# Patient Record
Sex: Female | Born: 1935 | Race: White | Hispanic: No | Marital: Married | Smoking: Never smoker
Health system: Southern US, Community
[De-identification: ages and names within clinical notes are randomized; demographics above are authoritative.]

## PROBLEM LIST (undated history)

## (undated) DIAGNOSIS — E7800 Pure hypercholesterolemia, unspecified: Secondary | ICD-10-CM

## (undated) DIAGNOSIS — F0390 Unspecified dementia without behavioral disturbance: Secondary | ICD-10-CM

## (undated) DIAGNOSIS — R69 Illness, unspecified: Secondary | ICD-10-CM

## (undated) DIAGNOSIS — I10 Essential (primary) hypertension: Secondary | ICD-10-CM

## (undated) HISTORY — PX: EYE SURGERY: SHX253

---

## 2002-01-27 ENCOUNTER — Other Ambulatory Visit: Admission: RE | Admit: 2002-01-27 | Discharge: 2002-01-27 | Payer: Self-pay | Admitting: Internal Medicine

## 2002-02-01 ENCOUNTER — Ambulatory Visit (HOSPITAL_COMMUNITY): Admission: RE | Admit: 2002-02-01 | Discharge: 2002-02-01 | Payer: Self-pay | Admitting: Internal Medicine

## 2002-02-01 ENCOUNTER — Encounter: Payer: Self-pay | Admitting: Internal Medicine

## 2004-08-30 ENCOUNTER — Ambulatory Visit (HOSPITAL_COMMUNITY): Admission: RE | Admit: 2004-08-30 | Discharge: 2004-08-30 | Payer: Self-pay | Admitting: Internal Medicine

## 2007-05-17 ENCOUNTER — Ambulatory Visit (HOSPITAL_COMMUNITY): Admission: RE | Admit: 2007-05-17 | Discharge: 2007-05-17 | Payer: Self-pay | Admitting: Ophthalmology

## 2009-12-13 ENCOUNTER — Ambulatory Visit (HOSPITAL_COMMUNITY): Admission: RE | Admit: 2009-12-13 | Discharge: 2009-12-13 | Payer: Self-pay | Admitting: Ophthalmology

## 2011-03-17 LAB — BASIC METABOLIC PANEL
BUN: 30 mg/dL — ABNORMAL HIGH (ref 6–23)
CO2: 28 mEq/L (ref 19–32)
Calcium: 9.8 mg/dL (ref 8.4–10.5)
Chloride: 99 mEq/L (ref 96–112)
Creatinine, Ser: 1 mg/dL (ref 0.4–1.2)
GFR calc Af Amer: >60 mL/min (ref >60)
GFR calc non Af Amer: 53 mL/min — ABNORMAL LOW (ref >60)
Glucose, Bld: 98 mg/dL (ref 70–99)
Potassium: 4.3 mEq/L (ref 3.5–5.1)
Sodium: 136 mEq/L (ref 135–145)

## 2011-03-17 LAB — GLUCOSE, CAPILLARY: Glucose-Capillary: 91 mg/dL (ref 70–99)

## 2011-03-17 LAB — HEMOGLOBIN AND HEMATOCRIT, BLOOD
HCT: 37.1 % (ref 36.0–46.0)
Hemoglobin: 12.5 g/dL (ref 12.0–15.0)

## 2011-07-20 ENCOUNTER — Encounter: Payer: Self-pay | Admitting: Emergency Medicine

## 2011-07-20 ENCOUNTER — Emergency Department (HOSPITAL_COMMUNITY)
Admission: EM | Admit: 2011-07-20 | Discharge: 2011-07-20 | Disposition: A | Discharge status: Home / Self Care | Payer: Medicare Other | Attending: Emergency Medicine | Admitting: Emergency Medicine

## 2011-07-20 ENCOUNTER — Emergency Department (HOSPITAL_COMMUNITY): Payer: Medicare Other

## 2011-07-20 DIAGNOSIS — W108XXA Fall (on) (from) other stairs and steps, initial encounter: Secondary | ICD-10-CM | POA: Insufficient documentation

## 2011-07-20 DIAGNOSIS — W101XXA Fall (on)(from) sidewalk curb, initial encounter: Secondary | ICD-10-CM

## 2011-07-20 DIAGNOSIS — R143 Flatulence: Secondary | ICD-10-CM | POA: Insufficient documentation

## 2011-07-20 DIAGNOSIS — E119 Type 2 diabetes mellitus without complications: Secondary | ICD-10-CM | POA: Insufficient documentation

## 2011-07-20 DIAGNOSIS — R141 Gas pain: Secondary | ICD-10-CM | POA: Insufficient documentation

## 2011-07-20 DIAGNOSIS — R142 Eructation: Secondary | ICD-10-CM | POA: Insufficient documentation

## 2011-07-20 DIAGNOSIS — S81009A Unspecified open wound, unspecified knee, initial encounter: Secondary | ICD-10-CM | POA: Insufficient documentation

## 2011-07-20 DIAGNOSIS — IMO0002 Reserved for concepts with insufficient information to code with codable children: Secondary | ICD-10-CM

## 2011-07-20 DIAGNOSIS — I10 Essential (primary) hypertension: Secondary | ICD-10-CM | POA: Insufficient documentation

## 2011-07-20 DIAGNOSIS — Y9229 Other specified public building as the place of occurrence of the external cause: Secondary | ICD-10-CM | POA: Insufficient documentation

## 2011-07-20 HISTORY — DX: Illness, unspecified: R69

## 2011-07-20 HISTORY — DX: Essential (primary) hypertension: I10

## 2011-07-20 MED ORDER — TETANUS-DIPHTH-ACELL PERTUSSIS 5-2.5-18.5 LF-MCG/0.5 IM SUSP
0.5000 mL | Freq: Once | INTRAMUSCULAR | Status: AC
  Administered 2011-07-20: 0.5 mL via INTRAMUSCULAR
  Filled 2011-07-20: qty 0.5

## 2011-07-20 MED ORDER — CEPHALEXIN 500 MG PO CAPS: 500.0000 mg | ORAL_CAPSULE | Freq: Four times a day (QID) | ORAL | Status: AC

## 2011-07-20 NOTE — ED Notes (Signed)
No change. Awaiting edp re eval. Nad.
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
## 2011-07-20 NOTE — ED Notes (Signed)
Put syringe in needle box prior to scanning.

## 2011-07-20 NOTE — ED Notes (Signed)
Area cleaned with shurclens and rinsed with NS. Nad. Awaiting xray.

## 2011-11-14 ENCOUNTER — Emergency Department (HOSPITAL_COMMUNITY)
Admission: EM | Admit: 2011-11-14 | Discharge: 2011-11-14 | Disposition: A | Discharge status: Home / Self Care | Payer: Medicare Other | Attending: Emergency Medicine | Admitting: Emergency Medicine

## 2011-11-14 ENCOUNTER — Encounter (HOSPITAL_COMMUNITY): Payer: Self-pay | Admitting: Emergency Medicine

## 2011-11-14 DIAGNOSIS — I10 Essential (primary) hypertension: Secondary | ICD-10-CM | POA: Insufficient documentation

## 2011-11-14 DIAGNOSIS — N8110 Cystocele, unspecified: Secondary | ICD-10-CM | POA: Insufficient documentation

## 2011-11-14 DIAGNOSIS — E119 Type 2 diabetes mellitus without complications: Secondary | ICD-10-CM | POA: Insufficient documentation

## 2011-11-14 LAB — URINALYSIS, ROUTINE W REFLEX MICROSCOPIC
Bilirubin Urine: NEGATIVE
Glucose, UA: NEGATIVE mg/dL
Ketones, ur: NEGATIVE mg/dL
Nitrite: NEGATIVE
Protein, ur: NEGATIVE mg/dL
Specific Gravity, Urine: 1.015 (ref 1.005–1.030)
Urobilinogen, UA: 0.2 mg/dL (ref 0.0–1.0)
pH: 6 (ref 5.0–8.0)

## 2011-11-14 LAB — URINE MICROSCOPIC-ADD ON

## 2011-11-14 NOTE — ED Notes (Signed)
Pt states she has been having "prolapsed bladder" ?uterus x 1 year. Pt states "something has been coming out for a year and I just push it back in there" denies pain. nad noted.

## 2011-11-14 NOTE — ED Provider Notes (Signed)
History    Scribed for Shelda Jakes, MD, the patient was seen in room APA03/APA03. This chart was scribed by Katha Cabal.   CSN: [id number] Arrival date & time: 11/14/2011 11:37 AM   First MD Initiated Contact with Patient 11/14/11 1239      Chief Complaint  Patient presents with  . Vaginal Prolapse    (Consider location/radiation/quality/duration/timing/severity/associated sxs/prior treatment) Patient is a 77 y.o. female presenting with female genitourinary complaint. The history is provided by the patient and the spouse. No language interpreter was used.  Female GU Problem Primary symptoms include no discharge, no pelvic pain and no dysuria. Primary symptoms comment: bladder prolapse  There has been no fever. This is a new problem. Episode onset: about a year.  The problem has not changed since onset.She is not pregnant. She has not missed her period. Pertinent negatives include no abdominal pain. Treatments tried: Patient states she pused skin fold back inside.   Patient reports that her "bladder drops down".  This is a new problem that has been intermittent for past year.  Husband states that Dr. Margo Aye is not aware of issue.  Patient has not seen a OB-GYN.     PCP Dwana Melena, MD     Past Medical History  Diagnosis Date  . Diabetes mellitus   . Hypertension   . Diagnosis unknown     History reviewed. No pertinent past surgical history.  No family history on file.  History  Substance Use Topics  . Smoking status: Never Smoker   . Smokeless tobacco: Not on file  . Alcohol Use: No    OB History    Grav Para Term Preterm Abortions TAB SAB Ect Mult Living                  Review of Systems  Cardiovascular: Negative for chest pain.  Gastrointestinal: Negative for abdominal pain.  Genitourinary: Negative for dysuria, vaginal discharge, vaginal pain and pelvic pain.  All other systems reviewed and are negative.    Allergies  Review of patient's allergies  indicates no known allergies.  Home Medications   Current Outpatient Rx  Name Route Sig Dispense Refill  . AMLODIPINE BESYLATE-VALSARTAN 5-320 MG PO TABS Oral Take 1 tablet by mouth daily.      Marland Kitchen CLORAZEPATE DIPOTASSIUM 7.5 MG PO TABS Oral Take 7.5 mg by mouth daily as needed. FOR ANXIETY     . HYDROCHLOROTHIAZIDE 12.5 MG PO CAPS Oral Take 12.5 mg by mouth every morning.      Marland Kitchen LEVOTHYROXINE SODIUM 25 MCG PO TABS Oral Take 25 mcg by mouth daily.      Marland Kitchen METFORMIN HCL 500 MG PO TABS Oral Take 500 mg by mouth 2 (two) times daily.      Marland Kitchen METOPROLOL SUCCINATE ER 50 MG PO TB24 Oral Take 50 mg by mouth daily.      Marland Kitchen SIMVASTATIN 40 MG PO TABS Oral Take 40 mg by mouth daily.        BP 160/68  Pulse 73  Temp 97.3 F (36.3 C)  Resp 18  Ht 5\' 2"  (1.575 m)  Wt 110 lb (49.896 kg)  BMI 20.12 kg/m2  SpO2 100%  Physical Exam  Nursing note and vitals reviewed. Constitutional: She is oriented to person, place, and time. She appears well-developed and well-nourished. No distress.  HENT:  Head: Normocephalic and atraumatic.  Eyes: Conjunctivae and EOM are normal.  Neck: Neck supple.  Cardiovascular: Normal rate, regular rhythm and normal heart sounds.  Pulmonary/Chest: Effort normal and breath sounds normal. No respiratory distress.  Abdominal: Soft. There is no tenderness. There is no rebound and no guarding.  Genitourinary: Vagina normal. No vaginal discharge found.  Musculoskeletal: Normal range of motion. She exhibits no edema.  Neurological: She is alert and oriented to person, place, and time. No sensory deficit.  Skin: Skin is warm and dry. No rash noted.  Psychiatric: She has a normal mood and affect. Her behavior is normal.    ED Course  Procedures (including critical care time)   DIAGNOSTIC STUDIES: Oxygen Saturation is 100% on room air, normal by my interpretation.     COORDINATION OF CARE: 1:41 PM  Physical exam complete.  No prolapse appreciated.  1:56 PM  Plan to  discharge patient with GYN referral.  Patient agrees with plan.    Orders Placed This Encounter  Procedures  . Urinalysis with microscopic  . Urine microscopic-add on     LABS / RADIOLOGY:   Labs Reviewed  URINALYSIS, ROUTINE W REFLEX MICROSCOPIC - Abnormal; Notable for the following:    Hgb urine dipstick TRACE (*)    Leukocytes, UA MODERATE (*)    All other components within normal limits  URINE MICROSCOPIC-ADD ON   No results found.       MDM   MDM:   Bimanual pelvic exam without any significant abnormal findings no direct evidence of bladder or uterine prolapse however by history patient sounds like she's got bladder prolapse. Will refer to OB/GYN locally for further evaluation.        IMPRESSION: 1. Vaginal wall prolapse        I personally performed the services described in this documentation, which was scribed in my presence. The recorded information has been reviewed and considered.              Shelda Jakes, MD 11/14/11 1409

## 2013-05-16 ENCOUNTER — Encounter: Payer: Self-pay | Admitting: *Deleted

## 2013-05-17 ENCOUNTER — Ambulatory Visit: Payer: Self-pay | Admitting: Obstetrics & Gynecology

## 2013-06-06 ENCOUNTER — Ambulatory Visit (INDEPENDENT_AMBULATORY_CARE_PROVIDER_SITE_OTHER): Payer: Medicare Other | Admitting: Obstetrics & Gynecology

## 2013-06-06 ENCOUNTER — Encounter: Payer: Self-pay | Admitting: Obstetrics & Gynecology

## 2013-06-06 VITALS — BP 140/60 | Wt 96.0 lb

## 2013-06-06 DIAGNOSIS — N813 Complete uterovaginal prolapse: Secondary | ICD-10-CM

## 2013-06-06 NOTE — Progress Notes (Signed)
Patient ID: Paula [name], female   DOB: 11-Aug-1923, 79 y.o.   MRN: [id number]      [name] presents today for routine follow up related to her pessary.   She uses a Gelhorn 2 1/4 inches. She reports no vaginal discharge or vaginal bleeding.  Exam reveals no undue vaginal mucosal pressure of breakdown, no discharge and no vaginal bleeding.  The pessary is removed, cleaned and replaced without difficulty.    Paula [name] will be sen back in 6 months for continued follow up.  Lazaro Arms 06/06/2013 12:34 PM

## 2013-09-18 ENCOUNTER — Emergency Department (HOSPITAL_COMMUNITY)
Admission: EM | Admit: 2013-09-18 | Discharge: 2013-09-18 | Discharge status: Against Medical Advice | Payer: Medicare Other

## 2016-02-11 DIAGNOSIS — G309 Alzheimer's disease, unspecified: Secondary | ICD-10-CM | POA: Diagnosis not present

## 2016-02-11 DIAGNOSIS — F411 Generalized anxiety disorder: Secondary | ICD-10-CM | POA: Diagnosis not present

## 2016-02-11 DIAGNOSIS — E119 Type 2 diabetes mellitus without complications: Secondary | ICD-10-CM | POA: Diagnosis not present

## 2016-02-11 DIAGNOSIS — F0280 Dementia in other diseases classified elsewhere without behavioral disturbance: Secondary | ICD-10-CM | POA: Diagnosis not present

## 2016-02-20 DIAGNOSIS — F419 Anxiety disorder, unspecified: Secondary | ICD-10-CM | POA: Diagnosis not present

## 2016-02-20 DIAGNOSIS — F0390 Unspecified dementia without behavioral disturbance: Secondary | ICD-10-CM | POA: Diagnosis not present

## 2016-02-20 DIAGNOSIS — G309 Alzheimer's disease, unspecified: Secondary | ICD-10-CM | POA: Diagnosis not present

## 2016-02-20 DIAGNOSIS — I10 Essential (primary) hypertension: Secondary | ICD-10-CM | POA: Diagnosis not present

## 2016-02-20 DIAGNOSIS — E038 Other specified hypothyroidism: Secondary | ICD-10-CM | POA: Diagnosis not present

## 2016-02-20 DIAGNOSIS — E785 Hyperlipidemia, unspecified: Secondary | ICD-10-CM | POA: Diagnosis not present

## 2016-02-21 DIAGNOSIS — G309 Alzheimer's disease, unspecified: Secondary | ICD-10-CM | POA: Diagnosis not present

## 2016-02-21 DIAGNOSIS — F419 Anxiety disorder, unspecified: Secondary | ICD-10-CM | POA: Diagnosis not present

## 2016-03-04 DIAGNOSIS — F0280 Dementia in other diseases classified elsewhere without behavioral disturbance: Secondary | ICD-10-CM | POA: Diagnosis not present

## 2016-03-04 DIAGNOSIS — E119 Type 2 diabetes mellitus without complications: Secondary | ICD-10-CM | POA: Diagnosis not present

## 2016-03-04 DIAGNOSIS — G309 Alzheimer's disease, unspecified: Secondary | ICD-10-CM | POA: Diagnosis not present

## 2016-03-04 DIAGNOSIS — F419 Anxiety disorder, unspecified: Secondary | ICD-10-CM | POA: Diagnosis not present

## 2016-03-04 DIAGNOSIS — E785 Hyperlipidemia, unspecified: Secondary | ICD-10-CM | POA: Diagnosis not present

## 2016-03-04 DIAGNOSIS — I10 Essential (primary) hypertension: Secondary | ICD-10-CM | POA: Diagnosis not present

## 2016-03-04 DIAGNOSIS — E039 Hypothyroidism, unspecified: Secondary | ICD-10-CM | POA: Diagnosis not present

## 2016-03-04 DIAGNOSIS — M6281 Muscle weakness (generalized): Secondary | ICD-10-CM | POA: Diagnosis not present

## 2016-03-04 DIAGNOSIS — R2681 Unsteadiness on feet: Secondary | ICD-10-CM | POA: Diagnosis not present

## 2016-03-12 DIAGNOSIS — G309 Alzheimer's disease, unspecified: Secondary | ICD-10-CM | POA: Diagnosis not present

## 2016-03-12 DIAGNOSIS — R2681 Unsteadiness on feet: Secondary | ICD-10-CM | POA: Diagnosis not present

## 2016-03-12 DIAGNOSIS — M6281 Muscle weakness (generalized): Secondary | ICD-10-CM | POA: Diagnosis not present

## 2016-03-12 DIAGNOSIS — F0280 Dementia in other diseases classified elsewhere without behavioral disturbance: Secondary | ICD-10-CM | POA: Diagnosis not present

## 2016-03-12 DIAGNOSIS — E785 Hyperlipidemia, unspecified: Secondary | ICD-10-CM | POA: Diagnosis not present

## 2016-03-12 DIAGNOSIS — E039 Hypothyroidism, unspecified: Secondary | ICD-10-CM | POA: Diagnosis not present

## 2016-03-12 DIAGNOSIS — E119 Type 2 diabetes mellitus without complications: Secondary | ICD-10-CM | POA: Diagnosis not present

## 2016-03-12 DIAGNOSIS — F419 Anxiety disorder, unspecified: Secondary | ICD-10-CM | POA: Diagnosis not present

## 2016-03-12 DIAGNOSIS — I10 Essential (primary) hypertension: Secondary | ICD-10-CM | POA: Diagnosis not present

## 2016-03-17 DIAGNOSIS — E785 Hyperlipidemia, unspecified: Secondary | ICD-10-CM | POA: Diagnosis not present

## 2016-03-17 DIAGNOSIS — R2681 Unsteadiness on feet: Secondary | ICD-10-CM | POA: Diagnosis not present

## 2016-03-17 DIAGNOSIS — G309 Alzheimer's disease, unspecified: Secondary | ICD-10-CM | POA: Diagnosis not present

## 2016-03-17 DIAGNOSIS — M6281 Muscle weakness (generalized): Secondary | ICD-10-CM | POA: Diagnosis not present

## 2016-03-17 DIAGNOSIS — E119 Type 2 diabetes mellitus without complications: Secondary | ICD-10-CM | POA: Diagnosis not present

## 2016-03-17 DIAGNOSIS — I10 Essential (primary) hypertension: Secondary | ICD-10-CM | POA: Diagnosis not present

## 2016-03-17 DIAGNOSIS — F419 Anxiety disorder, unspecified: Secondary | ICD-10-CM | POA: Diagnosis not present

## 2016-03-17 DIAGNOSIS — F0280 Dementia in other diseases classified elsewhere without behavioral disturbance: Secondary | ICD-10-CM | POA: Diagnosis not present

## 2016-03-17 DIAGNOSIS — E039 Hypothyroidism, unspecified: Secondary | ICD-10-CM | POA: Diagnosis not present

## 2016-03-19 ENCOUNTER — Inpatient Hospital Stay (HOSPITAL_COMMUNITY)
Admission: EM | Admit: 2016-03-19 | Discharge: 2016-03-24 | DRG: 481 | Disposition: A | Discharge status: Skilled Nursing Facility | Payer: PPO | Attending: Internal Medicine | Admitting: Internal Medicine

## 2016-03-19 ENCOUNTER — Emergency Department (HOSPITAL_COMMUNITY): Payer: PPO

## 2016-03-19 ENCOUNTER — Encounter (HOSPITAL_COMMUNITY): Payer: Self-pay

## 2016-03-19 DIAGNOSIS — S72091A Other fracture of head and neck of right femur, initial encounter for closed fracture: Secondary | ICD-10-CM | POA: Diagnosis not present

## 2016-03-19 DIAGNOSIS — R1310 Dysphagia, unspecified: Secondary | ICD-10-CM | POA: Diagnosis not present

## 2016-03-19 DIAGNOSIS — N390 Urinary tract infection, site not specified: Secondary | ICD-10-CM | POA: Diagnosis not present

## 2016-03-19 DIAGNOSIS — N183 Chronic kidney disease, stage 3 unspecified: Secondary | ICD-10-CM | POA: Diagnosis present

## 2016-03-19 DIAGNOSIS — I129 Hypertensive chronic kidney disease with stage 1 through stage 4 chronic kidney disease, or unspecified chronic kidney disease: Secondary | ICD-10-CM | POA: Diagnosis not present

## 2016-03-19 DIAGNOSIS — G309 Alzheimer's disease, unspecified: Secondary | ICD-10-CM | POA: Diagnosis not present

## 2016-03-19 DIAGNOSIS — I10 Essential (primary) hypertension: Secondary | ICD-10-CM | POA: Diagnosis not present

## 2016-03-19 DIAGNOSIS — S72001A Fracture of unspecified part of neck of right femur, initial encounter for closed fracture: Secondary | ICD-10-CM | POA: Diagnosis not present

## 2016-03-19 DIAGNOSIS — E785 Hyperlipidemia, unspecified: Secondary | ICD-10-CM | POA: Diagnosis present

## 2016-03-19 DIAGNOSIS — E1122 Type 2 diabetes mellitus with diabetic chronic kidney disease: Secondary | ICD-10-CM | POA: Diagnosis not present

## 2016-03-19 DIAGNOSIS — Z8249 Family history of ischemic heart disease and other diseases of the circulatory system: Secondary | ICD-10-CM

## 2016-03-19 DIAGNOSIS — Y92128 Other place in nursing home as the place of occurrence of the external cause: Secondary | ICD-10-CM | POA: Diagnosis not present

## 2016-03-19 DIAGNOSIS — S79921A Unspecified injury of right thigh, initial encounter: Secondary | ICD-10-CM | POA: Diagnosis not present

## 2016-03-19 DIAGNOSIS — E039 Hypothyroidism, unspecified: Secondary | ICD-10-CM | POA: Diagnosis not present

## 2016-03-19 DIAGNOSIS — S199XXA Unspecified injury of neck, initial encounter: Secondary | ICD-10-CM | POA: Diagnosis not present

## 2016-03-19 DIAGNOSIS — Z66 Do not resuscitate: Secondary | ICD-10-CM | POA: Diagnosis present

## 2016-03-19 DIAGNOSIS — M25551 Pain in right hip: Secondary | ICD-10-CM | POA: Diagnosis not present

## 2016-03-19 DIAGNOSIS — W19XXXA Unspecified fall, initial encounter: Secondary | ICD-10-CM | POA: Diagnosis not present

## 2016-03-19 DIAGNOSIS — R4182 Altered mental status, unspecified: Secondary | ICD-10-CM | POA: Diagnosis not present

## 2016-03-19 DIAGNOSIS — S72009A Fracture of unspecified part of neck of unspecified femur, initial encounter for closed fracture: Secondary | ICD-10-CM | POA: Diagnosis present

## 2016-03-19 DIAGNOSIS — Y92129 Unspecified place in nursing home as the place of occurrence of the external cause: Secondary | ICD-10-CM

## 2016-03-19 DIAGNOSIS — E7800 Pure hypercholesterolemia, unspecified: Secondary | ICD-10-CM | POA: Diagnosis not present

## 2016-03-19 DIAGNOSIS — I6789 Other cerebrovascular disease: Secondary | ICD-10-CM | POA: Diagnosis not present

## 2016-03-19 DIAGNOSIS — S72011D Unspecified intracapsular fracture of right femur, subsequent encounter for closed fracture with routine healing: Secondary | ICD-10-CM | POA: Diagnosis not present

## 2016-03-19 DIAGNOSIS — E038 Other specified hypothyroidism: Secondary | ICD-10-CM | POA: Diagnosis not present

## 2016-03-19 DIAGNOSIS — S3993XA Unspecified injury of pelvis, initial encounter: Secondary | ICD-10-CM | POA: Diagnosis not present

## 2016-03-19 DIAGNOSIS — F0390 Unspecified dementia without behavioral disturbance: Secondary | ICD-10-CM | POA: Diagnosis present

## 2016-03-19 DIAGNOSIS — S299XXA Unspecified injury of thorax, initial encounter: Secondary | ICD-10-CM | POA: Diagnosis not present

## 2016-03-19 DIAGNOSIS — E119 Type 2 diabetes mellitus without complications: Secondary | ICD-10-CM

## 2016-03-19 DIAGNOSIS — S72011A Unspecified intracapsular fracture of right femur, initial encounter for closed fracture: Principal | ICD-10-CM | POA: Diagnosis present

## 2016-03-19 DIAGNOSIS — S72019A Unspecified intracapsular fracture of unspecified femur, initial encounter for closed fracture: Secondary | ICD-10-CM | POA: Diagnosis present

## 2016-03-19 DIAGNOSIS — F4489 Other dissociative and conversion disorders: Secondary | ICD-10-CM | POA: Diagnosis not present

## 2016-03-19 DIAGNOSIS — T148XXA Other injury of unspecified body region, initial encounter: Secondary | ICD-10-CM

## 2016-03-19 HISTORY — DX: Pure hypercholesterolemia, unspecified: E78.00

## 2016-03-19 NOTE — ED Provider Notes (Signed)
CSN: [id number]     Arrival date & time 03/19/16  2231 History  By signing my name below, I, Paula [name], attest that this documentation has been prepared under the direction and in the presence of Paula Porter, MD at 2308. Electronically Signed: Doran [name], ED Scribe. 03/20/2016. 11:16 PM.   Chief Complaint  Patient presents with  . Fall   The history is provided by a relative. No language interpreter was used.    Level 5 Caveat for Dementia  HPI Comments: Paula [name] is a 82 y.o. female who presents to the Emergency Department with a PMHx of dementia, DM and HTN for an evaluation s/p fall. Granddaughter reports pt has been staying at [facility] with her husband for the past 4 weeks. Tonight she went to look in the hall to see if it was daytime or night time and when she turned to go back into her room she fell. She denied feeling dizzy to her granddaughter.  Since then, pt reports right leg pain, pointing to her knee. Granddaughter reports the pt is otherwise normal. Pt denies any head pain, CP, SOB, N/V/D or any other symptoms at this time.   Granddaughter states the pt was  on Tranxene as needed however at home and maybe took it 3 times a month, the nursing home has the pt taking it daily. The GD noted some shaking of her arms tonight.   PCP Dr Paula [name]  Past Medical History  Diagnosis Date  . Diabetes mellitus   . Hypertension   . Diagnosis unknown    Past Surgical History  Procedure Laterality Date  . Eye surgery     No family history on file. Social History  Substance Use Topics  . Smoking status: Never Smoker   . Smokeless tobacco: Never Used  . Alcohol Use: No   Lives with spouse Lives in nursing facility  OB History    No data available     Review of Systems  Respiratory: Negative for shortness of breath.   Cardiovascular: Negative for chest pain.  Gastrointestinal: Negative for nausea, vomiting and diarrhea.  Neurological: Negative for headaches.   All other systems reviewed and are negative.   Allergies  Review of patient's allergies indicates no known allergies.  Home Medications   Prior to Admission medications   Medication Sig Start Date End Date Taking? Authorizing Provider  amLODipine-valsartan (EXFORGE) 5-320 MG per tablet Take 1 tablet by mouth daily.      Historical Provider, MD  clorazepate (TRANXENE) 7.5 MG tablet Take 7.5 mg by mouth daily as needed. FOR ANXIETY     Historical Provider, MD  hydrochlorothiazide (,MICROZIDE/HYDRODIURIL,) 12.5 MG capsule Take 12.5 mg by mouth every morning.      Historical Provider, MD  levothyroxine (SYNTHROID, LEVOTHROID) 25 MCG tablet Take 25 mcg by mouth daily.      Historical Provider, MD  metFORMIN (GLUCOPHAGE) 500 MG tablet Take 500 mg by mouth 2 (two) times daily.      Historical Provider, MD  metoprolol (TOPROL-XL) 50 MG 24 hr tablet Take 50 mg by mouth daily.      Historical Provider, MD  simvastatin (ZOCOR) 40 MG tablet Take 40 mg by mouth daily.      Historical Provider, MD   BP 157/83 mmHg  Pulse 82  Temp(Src) 97.6 F (36.4 C) (Oral)  Resp 14  SpO2 98%  Vital signs normal     Physical Exam  Constitutional: She is oriented to person, place, and  time. She appears well-developed and well-nourished.  Non-toxic appearance. She does not appear ill. No distress.  HENT:  Head: Normocephalic and atraumatic.  Right Ear: External ear normal.  Left Ear: External ear normal.  Nose: Nose normal. No mucosal edema or rhinorrhea.  Mouth/Throat: Oropharynx is clear and moist and mucous membranes are normal. No dental abscesses or uvula swelling.  inconsistence complaints of head and neck pain.   Eyes: Conjunctivae and EOM are normal. Pupils are equal, round, and reactive to light.  Neck: Normal range of motion and full passive range of motion without pain. Neck supple.  Cardiovascular: Normal rate, regular rhythm and normal heart sounds.  Exam reveals no gallop and no friction rub.    No murmur heard. Pulmonary/Chest: Effort normal and breath sounds normal. No respiratory distress. She has no wheezes. She has no rhonchi. She has no rales. She exhibits no tenderness and no crepitus.  Abdominal: Soft. Normal appearance and bowel sounds are normal. She exhibits no distension. There is no tenderness. There is no rebound and no guarding.  Musculoskeletal: Normal range of motion. She exhibits no edema or tenderness.  Moves all extremities well; points to her right knee as to what is painful; no swelling, abrasions, or bruising noted.  Neurological: She is alert and oriented to person, place, and time. She has normal strength. No cranial nerve deficit.  Mild intentional tremor  Skin: Skin is warm, dry and intact. No rash noted. No erythema. No pallor.  Psychiatric: She has a normal mood and affect. Her speech is normal and behavior is normal. Her mood appears not anxious.  Nursing note and vitals reviewed.   ED Course  Procedures   Medications  cefTRIAXone (ROCEPHIN) 1 g in dextrose 5 % 50 mL IVPB (not administered)  fentaNYL (SUBLIMAZE) injection 25 mcg (25 mcg Intravenous Given 03/20/16 0050)  fentaNYL (SUBLIMAZE) injection 50 mcg (50 mcg Intravenous Given 03/20/16 0220)     DIAGNOSTIC STUDIES: Oxygen Saturation is 98% on room air, normal by my interpretation.    COORDINATION OF CARE: 11:08 PM Will order CT C-spine, CXR, right hip and femur x-ray. Discussed treatment plan with pt and granddaughter at bedside and they agreed to plan.  12:15 AM the x-ray is not clear per the radiologist's if there is a fracture present. CT of the hip was ordered. This was discussed with the granddaughter. We also started a discussion as to have them discuss where they would like her to be admitted for her fracture care. Patient is now starting to consistently complain of pain. She was given fentanyl  2:16 AM nurse reports patient is still complaining of a lot of pain and seems to be getting  confused. She was given a higher dose of fentanyl for her pain.  2:30 AM granddaughter states she talked to her brother who has used Dr. [name] in the past. They would like him to take care of their grandmother.  02:46 Dr [name] contacted, he will see in the morning.  03:05 Dr Loleta Books will see patient.   3:15 AM patient was given Rocephin for her urinary tract infection  Labs Review Results for orders placed or performed during the hospital encounter of 03/19/16  Comprehensive metabolic panel  Result Value Ref Range   Sodium 139 135 - 145 mmol/L   Potassium 4.3 3.5 - 5.1 mmol/L   Chloride 104 101 - 111 mmol/L   CO2 26 22 - 32 mmol/L   Glucose, Bld 139 (H) 65 - 99 mg/dL  BUN 37 (H) 6 - 20 mg/dL   Creatinine, Ser 1.10 (H) 0.44 - 1.00 mg/dL   Calcium 8.8 (L) 8.9 - 10.3 mg/dL   Total Protein 6.5 6.5 - 8.1 g/dL   Albumin 3.7 3.5 - 5.0 g/dL   AST 45 (H) 15 - 41 U/L   ALT 22 14 - 54 U/L   Alkaline Phosphatase 76 38 - 126 U/L   Total Bilirubin 1.0 0.3 - 1.2 mg/dL   GFR calc non Af Amer 43 (L) >60 mL/min   GFR calc Af Amer 49 (L) >60 mL/min   Anion gap 9 5 - 15  CBC with Differential  Result Value Ref Range   WBC 15.8 (H) 4.0 - 10.5 K/uL   RBC 4.14 3.87 - 5.11 MIL/uL   Hemoglobin 12.7 12.0 - 15.0 g/dL   HCT 38.0 36.0 - 46.0 %   MCV 91.8 78.0 - 100.0 fL   MCH 30.7 26.0 - 34.0 pg   MCHC 33.4 30.0 - 36.0 g/dL   RDW 13.4 11.5 - 15.5 %   Platelets 365 150 - 400 K/uL   Neutrophils Relative % 85 %   Neutro Abs 13.3 (H) 1.7 - 7.7 K/uL   Lymphocytes Relative 8 %   Lymphs Abs 1.3 0.7 - 4.0 K/uL   Monocytes Relative 7 %   Monocytes Absolute 1.2 (H) 0.1 - 1.0 K/uL   Eosinophils Relative 0 %   Eosinophils Absolute 0.0 0.0 - 0.7 K/uL   Basophils Relative 0 %   Basophils Absolute 0.0 0.0 - 0.1 K/uL   WBC Morphology INCREASED BANDS (>20% BANDS)   Protime-INR  Result Value Ref Range   Prothrombin Time 14.0 11.6 - 15.2 seconds   INR 1.06 0.00 - 1.49  APTT  Result Value Ref Range    aPTT 29 24 - 37 seconds  Urinalysis, Routine w reflex microscopic (not at [facility])  Result Value Ref Range   Color, Urine YELLOW YELLOW   APPearance CLEAR CLEAR   Specific Gravity, Urine 1.020 1.005 - 1.030   pH 6.0 5.0 - 8.0   Glucose, UA NEGATIVE NEGATIVE mg/dL   Hgb urine dipstick SMALL (A) NEGATIVE   Bilirubin Urine NEGATIVE NEGATIVE   Ketones, ur 15 (A) NEGATIVE mg/dL   Protein, ur TRACE (A) NEGATIVE mg/dL   Nitrite NEGATIVE NEGATIVE   Leukocytes, UA SMALL (A) NEGATIVE  Urine microscopic-add on  Result Value Ref Range   Squamous Epithelial / LPF 6-30 (A) NONE SEEN   WBC, UA TOO NUMEROUS TO COUNT 0 - 5 WBC/hpf   RBC / HPF 0-5 0 - 5 RBC/hpf   Bacteria, UA MANY (A) NONE SEEN  Sample to Blood Bank  Result Value Ref Range   Blood Bank Specimen SAMPLE AVAILABLE FOR TESTING    Sample Expiration 03/23/2016    Laboratory interpretation all normal except UTI, leukocytosis, mild renal insufficiency     Imaging Review Dg Chest 1 View  03/20/2016  CLINICAL DATA:  [facility] tonight. EXAM: CHEST 1 VIEW COMPARISON:  None. FINDINGS: A single AP view of the chest demonstrates no focal airspace consolidation or alveolar edema. The lungs are grossly clear. There is no large effusion or pneumothorax. Cardiac and mediastinal contours appear unremarkable. IMPRESSION: No active disease. Electronically Signed   By: Andreas Newport M.D.   On: 03/20/2016 00:08   Dg Pelvis 1-2 Views  03/20/2016  CLINICAL DATA:  [facility] tonight. EXAM: RIGHT FEMUR 2 VIEWS; PELVIS - 1-2 VIEW COMPARISON:  None. FINDINGS: A single AP view  the pelvis demonstrates probable subcapital right hip fracture. No dislocation. No bone lesion or bony destruction to suggest pathologic basis the fracture. Pubic symphysis and sacroiliac joints appear intact. Remainder of the femur is intact. IMPRESSION: Probable subcapital right hip fracture. No dislocation Electronically Signed   By: Andreas Newport M.D.   On: 03/20/2016 00:10   Ct Head Wo  Contrast  Ct Cervical Spine Wo Contrast  03/20/2016  CLINICAL DATA:  Fall with altered level of consciousness. EXAM: CT HEAD WITHOUT CONTRAST CT CERVICAL SPINE WITHOUT CONTRAST TECHNIQUE: Multidetector CT imaging of the head and cervical spine was performed following the standard protocol without intravenous contrast. Multiplanar CT image reconstructions of the cervical spine were also generated. COMPARISON:  None. FINDINGS: CT HEAD FINDINGS Diffuse atrophy present as well as moderate small vessel ischemic changes in the periventricular white matter. The brain demonstrates no evidence of hemorrhage, infarction, edema, mass effect, extra-axial fluid collection, hydrocephalus or mass lesion. The skull is unremarkable. CT CERVICAL SPINE FINDINGS The cervical spine shows normal alignment. There is no evidence of acute fracture or subluxation. No soft tissue swelling or hematoma is identified. Spondylosis present at C4-5, C5-6 and C6-7. No bony lesions identified. The visualized airway is normally patent. In the visualized left lobe of the thyroid gland, focal nodularity is seen measuring approximately 1.3 cm. IMPRESSION: 1. Small vessel disease and atrophy of the brain. No acute findings by head CT. 2. No acute cervical injury identified. Cervical spondylosis present at C4-5, C5-6 and C6-7. 3. Incidental detection of left thyroid nodule measuring approximately 1.3 cm. At the patient's age, this is likely not of significance. Elective thyroid ultrasound could be considered. Electronically Signed   By: Aletta Edouard M.D.   On: 03/20/2016 00:14   Ct Hip Right Wo Contrast  03/20/2016  CLINICAL DATA:  [facility] at nursing home tonight. EXAM: CT OF THE RIGHT HIP WITHOUT CONTRAST TECHNIQUE: Multidetector CT imaging of the right hip was performed according to the standard protocol. Multiplanar CT image reconstructions were also generated. COMPARISON:  Radiographs 03/19/2016 FINDINGS: There is a subcapital right hip fracture  with slight impaction. There is no dislocation. No bone lesion or bony destruction. No significant hematoma or other acute soft tissue abnormality. IMPRESSION: Subcapital right hip fracture Electronically Signed   By: Andreas Newport M.D.   On: 03/20/2016 01:34   Dg Femur, Min 2 Views Right  03/20/2016  CLINICAL DATA:  [facility] tonight. EXAM: RIGHT FEMUR 2 VIEWS; PELVIS - 1-2 VIEW COMPARISON:  None. FINDINGS: A single AP view the pelvis demonstrates probable subcapital right hip fracture. No dislocation. No bone lesion or bony destruction to suggest pathologic basis the fracture. Pubic symphysis and sacroiliac joints appear intact. Remainder of the femur is intact. IMPRESSION: Probable subcapital right hip fracture. No dislocation Electronically Signed   By: Andreas Newport M.D.   On: 03/20/2016 00:10   I have personally reviewed and evaluated these images and lab results as part of my medical decision-making.  ED ECG REPORT   Date: 03/20/2016  Rate: 87  Rhythm: normal sinus rhythm  QRS Axis: normal  Intervals: normal  ST/T Wave abnormalities: normal  Conduction Disutrbances:LVH  Narrative Interpretation:   Old EKG Reviewed: none available  I have personally reviewed the EKG tracing and agree with the computerized printout as noted.   MDM   Final diagnoses:  Fall at nursing home, initial encounter  Hip fracture, right, closed, initial encounter [facility])  UTI (lower urinary tract infection)    Plan admission  Paula Porter, MD, FACEP   I personally performed the services described in this documentation, which was scribed in my presence. The recorded information has been reviewed and considered.  Paula Porter, MD, Barbette Or, MD 03/20/16 [phone]

## 2016-03-19 NOTE — ED Notes (Signed)
Family member reported to staff at [facility] that patient had fallen.  Per ems, pt only c/o pain to right thigh.  No obvious injury noted.

## 2016-03-20 ENCOUNTER — Inpatient Hospital Stay (HOSPITAL_COMMUNITY): Payer: PPO

## 2016-03-20 ENCOUNTER — Emergency Department (HOSPITAL_COMMUNITY): Payer: PPO

## 2016-03-20 ENCOUNTER — Encounter (HOSPITAL_COMMUNITY)
Admission: EM | Disposition: A | Discharge status: Skilled Nursing Facility | Payer: Self-pay | Source: Home / Self Care | Attending: Internal Medicine

## 2016-03-20 ENCOUNTER — Encounter (HOSPITAL_COMMUNITY): Payer: Self-pay | Admitting: Family Medicine

## 2016-03-20 ENCOUNTER — Inpatient Hospital Stay (HOSPITAL_COMMUNITY): Payer: PPO | Admitting: Anesthesiology

## 2016-03-20 DIAGNOSIS — N390 Urinary tract infection, site not specified: Secondary | ICD-10-CM | POA: Diagnosis present

## 2016-03-20 DIAGNOSIS — S72019A Unspecified intracapsular fracture of unspecified femur, initial encounter for closed fracture: Secondary | ICD-10-CM | POA: Diagnosis present

## 2016-03-20 DIAGNOSIS — S72091A Other fracture of head and neck of right femur, initial encounter for closed fracture: Secondary | ICD-10-CM | POA: Diagnosis not present

## 2016-03-20 DIAGNOSIS — I129 Hypertensive chronic kidney disease with stage 1 through stage 4 chronic kidney disease, or unspecified chronic kidney disease: Secondary | ICD-10-CM | POA: Diagnosis not present

## 2016-03-20 DIAGNOSIS — N183 Chronic kidney disease, stage 3 unspecified: Secondary | ICD-10-CM | POA: Diagnosis present

## 2016-03-20 DIAGNOSIS — E785 Hyperlipidemia, unspecified: Secondary | ICD-10-CM | POA: Diagnosis not present

## 2016-03-20 DIAGNOSIS — M25551 Pain in right hip: Secondary | ICD-10-CM | POA: Diagnosis not present

## 2016-03-20 DIAGNOSIS — Z66 Do not resuscitate: Secondary | ICD-10-CM | POA: Diagnosis not present

## 2016-03-20 DIAGNOSIS — E1122 Type 2 diabetes mellitus with diabetic chronic kidney disease: Secondary | ICD-10-CM | POA: Diagnosis not present

## 2016-03-20 DIAGNOSIS — S79921A Unspecified injury of right thigh, initial encounter: Secondary | ICD-10-CM | POA: Diagnosis not present

## 2016-03-20 DIAGNOSIS — E038 Other specified hypothyroidism: Secondary | ICD-10-CM | POA: Diagnosis not present

## 2016-03-20 DIAGNOSIS — S199XXA Unspecified injury of neck, initial encounter: Secondary | ICD-10-CM | POA: Diagnosis not present

## 2016-03-20 DIAGNOSIS — F4489 Other dissociative and conversion disorders: Secondary | ICD-10-CM | POA: Diagnosis not present

## 2016-03-20 DIAGNOSIS — E039 Hypothyroidism, unspecified: Secondary | ICD-10-CM | POA: Diagnosis not present

## 2016-03-20 DIAGNOSIS — R4182 Altered mental status, unspecified: Secondary | ICD-10-CM | POA: Diagnosis not present

## 2016-03-20 DIAGNOSIS — S72009A Fracture of unspecified part of neck of unspecified femur, initial encounter for closed fracture: Secondary | ICD-10-CM | POA: Diagnosis present

## 2016-03-20 DIAGNOSIS — I10 Essential (primary) hypertension: Secondary | ICD-10-CM | POA: Diagnosis not present

## 2016-03-20 DIAGNOSIS — W19XXXA Unspecified fall, initial encounter: Secondary | ICD-10-CM | POA: Diagnosis not present

## 2016-03-20 DIAGNOSIS — S72001A Fracture of unspecified part of neck of right femur, initial encounter for closed fracture: Secondary | ICD-10-CM | POA: Diagnosis not present

## 2016-03-20 DIAGNOSIS — S299XXA Unspecified injury of thorax, initial encounter: Secondary | ICD-10-CM | POA: Diagnosis not present

## 2016-03-20 DIAGNOSIS — E119 Type 2 diabetes mellitus without complications: Secondary | ICD-10-CM | POA: Diagnosis not present

## 2016-03-20 DIAGNOSIS — I6789 Other cerebrovascular disease: Secondary | ICD-10-CM | POA: Diagnosis not present

## 2016-03-20 DIAGNOSIS — S3993XA Unspecified injury of pelvis, initial encounter: Secondary | ICD-10-CM | POA: Diagnosis not present

## 2016-03-20 DIAGNOSIS — R1310 Dysphagia, unspecified: Secondary | ICD-10-CM | POA: Diagnosis not present

## 2016-03-20 DIAGNOSIS — F0390 Unspecified dementia without behavioral disturbance: Secondary | ICD-10-CM | POA: Diagnosis not present

## 2016-03-20 DIAGNOSIS — G309 Alzheimer's disease, unspecified: Secondary | ICD-10-CM | POA: Diagnosis not present

## 2016-03-20 DIAGNOSIS — S72011D Unspecified intracapsular fracture of right femur, subsequent encounter for closed fracture with routine healing: Secondary | ICD-10-CM | POA: Diagnosis not present

## 2016-03-20 DIAGNOSIS — Y92128 Other place in nursing home as the place of occurrence of the external cause: Secondary | ICD-10-CM | POA: Diagnosis not present

## 2016-03-20 DIAGNOSIS — S72011A Unspecified intracapsular fracture of right femur, initial encounter for closed fracture: Secondary | ICD-10-CM | POA: Diagnosis not present

## 2016-03-20 DIAGNOSIS — E7800 Pure hypercholesterolemia, unspecified: Secondary | ICD-10-CM | POA: Diagnosis not present

## 2016-03-20 DIAGNOSIS — Z8249 Family history of ischemic heart disease and other diseases of the circulatory system: Secondary | ICD-10-CM | POA: Diagnosis not present

## 2016-03-20 HISTORY — PX: HIP PINNING,CANNULATED: SHX1758

## 2016-03-20 LAB — PROTIME-INR
INR: 1.06 (ref 0.00–1.49)
PROTHROMBIN TIME: 14 s (ref 11.6–15.2)

## 2016-03-20 LAB — CBC
HCT: 38.7 % (ref 36.0–46.0)
Hemoglobin: 12.8 g/dL (ref 12.0–15.0)
MCH: 30.5 pg (ref 26.0–34.0)
MCHC: 33.1 g/dL (ref 30.0–36.0)
MCV: 92.1 fL (ref 78.0–100.0)
PLATELETS: 350 10*3/uL (ref 150–400)
RBC: 4.2 MIL/uL (ref 3.87–5.11)
RDW: 13.3 % (ref 11.5–15.5)
WBC: 15.2 10*3/uL — ABNORMAL HIGH (ref 4.0–10.5)

## 2016-03-20 LAB — COMPREHENSIVE METABOLIC PANEL
ALBUMIN: 3.7 g/dL (ref 3.5–5.0)
ALK PHOS: 76 U/L (ref 38–126)
ALT: 22 U/L (ref 14–54)
ANION GAP: 9 (ref 5–15)
AST: 45 U/L — ABNORMAL HIGH (ref 15–41)
BILIRUBIN TOTAL: 1 mg/dL (ref 0.3–1.2)
BUN: 37 mg/dL — ABNORMAL HIGH (ref 6–20)
CALCIUM: 8.8 mg/dL — AB (ref 8.9–10.3)
CO2: 26 mmol/L (ref 22–32)
Chloride: 104 mmol/L (ref 101–111)
Creatinine, Ser: 1.1 mg/dL — ABNORMAL HIGH (ref 0.44–1.00)
GFR, EST AFRICAN AMERICAN: 49 mL/min — AB (ref >60)
GFR, EST NON AFRICAN AMERICAN: 43 mL/min — AB (ref >60)
Glucose, Bld: 139 mg/dL — ABNORMAL HIGH (ref 65–99)
POTASSIUM: 4.3 mmol/L (ref 3.5–5.1)
Sodium: 139 mmol/L (ref 135–145)
TOTAL PROTEIN: 6.5 g/dL (ref 6.5–8.1)

## 2016-03-20 LAB — URINALYSIS, ROUTINE W REFLEX MICROSCOPIC
Bilirubin Urine: NEGATIVE
GLUCOSE, UA: NEGATIVE mg/dL
Ketones, ur: 15 mg/dL — AB
Nitrite: NEGATIVE
PH: 6 (ref 5.0–8.0)
Specific Gravity, Urine: 1.02 (ref 1.005–1.030)

## 2016-03-20 LAB — SURGICAL PCR SCREEN
MRSA, PCR: NEGATIVE
Staphylococcus aureus: NEGATIVE

## 2016-03-20 LAB — BASIC METABOLIC PANEL
Anion gap: 12 (ref 5–15)
BUN: 34 mg/dL — AB (ref 6–20)
CO2: 22 mmol/L (ref 22–32)
Calcium: 8.9 mg/dL (ref 8.9–10.3)
Chloride: 105 mmol/L (ref 101–111)
Creatinine, Ser: 1.03 mg/dL — ABNORMAL HIGH (ref 0.44–1.00)
GFR calc Af Amer: 53 mL/min — ABNORMAL LOW (ref >60)
GFR, EST NON AFRICAN AMERICAN: 46 mL/min — AB (ref >60)
Glucose, Bld: 167 mg/dL — ABNORMAL HIGH (ref 65–99)
Potassium: 4.3 mmol/L (ref 3.5–5.1)
SODIUM: 139 mmol/L (ref 135–145)

## 2016-03-20 LAB — CBC WITH DIFFERENTIAL/PLATELET
BASOS ABS: 0 10*3/uL (ref 0.0–0.1)
BASOS PCT: 0 %
Eosinophils Absolute: 0 10*3/uL (ref 0.0–0.7)
Eosinophils Relative: 0 %
HEMATOCRIT: 38 % (ref 36.0–46.0)
HEMOGLOBIN: 12.7 g/dL (ref 12.0–15.0)
LYMPHS PCT: 8 %
Lymphs Abs: 1.3 10*3/uL (ref 0.7–4.0)
MCH: 30.7 pg (ref 26.0–34.0)
MCHC: 33.4 g/dL (ref 30.0–36.0)
MCV: 91.8 fL (ref 78.0–100.0)
MONO ABS: 1.2 10*3/uL — AB (ref 0.1–1.0)
Monocytes Relative: 7 %
NEUTROS ABS: 13.3 10*3/uL — AB (ref 1.7–7.7)
NEUTROS PCT: 85 %
Platelets: 365 10*3/uL (ref 150–400)
RBC: 4.14 MIL/uL (ref 3.87–5.11)
RDW: 13.4 % (ref 11.5–15.5)
WBC MORPHOLOGY: INCREASED
WBC: 15.8 10*3/uL — AB (ref 4.0–10.5)

## 2016-03-20 LAB — SAMPLE TO BLOOD BANK

## 2016-03-20 LAB — URINE MICROSCOPIC-ADD ON

## 2016-03-20 LAB — GLUCOSE, CAPILLARY
GLUCOSE-CAPILLARY: 129 mg/dL — AB (ref 65–99)
Glucose-Capillary: 136 mg/dL — ABNORMAL HIGH (ref 65–99)
Glucose-Capillary: 118 mg/dL — ABNORMAL HIGH (ref 65–99)

## 2016-03-20 LAB — APTT: APTT: 29 s (ref 24–37)

## 2016-03-20 LAB — ABO/RH: ABO/RH(D): O POS

## 2016-03-20 SURGERY — FIXATION, FEMUR, NECK, PERCUTANEOUS, USING SCREW
Anesthesia: Spinal | Laterality: Right

## 2016-03-20 MED ORDER — FENTANYL CITRATE (PF) 100 MCG/2ML IJ SOLN
INTRAMUSCULAR | Status: DC | PRN
  Administered 2016-03-20: 25 ug via INTRATHECAL

## 2016-03-20 MED ORDER — ACETAMINOPHEN 650 MG RE SUPP: 650.0000 mg | Freq: Four times a day (QID) | RECTAL | Status: DC | PRN

## 2016-03-20 MED ORDER — CHLORHEXIDINE GLUCONATE 4 % EX LIQD
60.0000 mL | Freq: Once | CUTANEOUS | Status: DC
  Administered 2016-03-20: 4 via TOPICAL

## 2016-03-20 MED ORDER — ONDANSETRON HCL 4 MG/2ML IJ SOLN: 4.0000 mg | Freq: Four times a day (QID) | INTRAMUSCULAR | Status: DC | PRN

## 2016-03-20 MED ORDER — DEXTROSE 5 % IV SOLN
1.0000 g | INTRAVENOUS | Status: DC
  Administered 2016-03-21 – 2016-03-24 (×4): 1 g via INTRAVENOUS
  Filled 2016-03-20 (×5): qty 10

## 2016-03-20 MED ORDER — FENTANYL CITRATE (PF) 100 MCG/2ML IJ SOLN
25.0000 ug | Freq: Once | INTRAMUSCULAR | Status: AC
  Administered 2016-03-20: 25 ug via INTRAVENOUS
  Filled 2016-03-20: qty 2

## 2016-03-20 MED ORDER — TRAMADOL HCL 50 MG PO TABS
50.0000 mg | ORAL_TABLET | Freq: Four times a day (QID) | ORAL | Status: DC | PRN
  Administered 2016-03-22 – 2016-03-23 (×2): 50 mg via ORAL
  Filled 2016-03-20 (×2): qty 1

## 2016-03-20 MED ORDER — ONDANSETRON HCL 4 MG/2ML IJ SOLN: 4.0000 mg | Freq: Once | INTRAMUSCULAR | Status: DC | PRN

## 2016-03-20 MED ORDER — FENTANYL CITRATE (PF) 100 MCG/2ML IJ SOLN: 25.0000 ug | INTRAMUSCULAR | Status: DC | PRN

## 2016-03-20 MED ORDER — ACETAMINOPHEN 10 MG/ML IV SOLN
1000.0000 mg | Freq: Four times a day (QID) | INTRAVENOUS | Status: AC
  Administered 2016-03-20 – 2016-03-21 (×4): 1000 mg via INTRAVENOUS
  Filled 2016-03-20 (×5): qty 100

## 2016-03-20 MED ORDER — FENTANYL CITRATE (PF) 100 MCG/2ML IJ SOLN
25.0000 ug | INTRAMUSCULAR | Status: AC
  Administered 2016-03-20 (×2): 25 ug via INTRAVENOUS

## 2016-03-20 MED ORDER — PROPOFOL 10 MG/ML IV BOLUS
INTRAVENOUS | Status: AC
  Filled 2016-03-20: qty 20

## 2016-03-20 MED ORDER — LACTATED RINGERS IV SOLN
INTRAVENOUS | Status: DC
  Administered 2016-03-20: 11:00:00 via INTRAVENOUS

## 2016-03-20 MED ORDER — FENTANYL CITRATE (PF) 100 MCG/2ML IJ SOLN
INTRAMUSCULAR | Status: AC
  Filled 2016-03-20: qty 2

## 2016-03-20 MED ORDER — ACETAMINOPHEN 325 MG PO TABS: 650.0000 mg | ORAL_TABLET | Freq: Four times a day (QID) | ORAL | Status: DC | PRN

## 2016-03-20 MED ORDER — POLYETHYLENE GLYCOL 3350 17 G PO PACK
17.0000 g | PACK | Freq: Every day | ORAL | Status: DC
  Administered 2016-03-21 – 2016-03-24 (×3): 17 g via ORAL
  Filled 2016-03-20 (×3): qty 1

## 2016-03-20 MED ORDER — DEXTROSE 5 % IV SOLN
1.0000 g | Freq: Once | INTRAVENOUS | Status: AC
  Administered 2016-03-20: 1 g via INTRAVENOUS
  Filled 2016-03-20: qty 10

## 2016-03-20 MED ORDER — BUPIVACAINE IN DEXTROSE 0.75-8.25 % IT SOLN
INTRATHECAL | Status: AC
  Filled 2016-03-20: qty 2

## 2016-03-20 MED ORDER — SODIUM CHLORIDE 0.9 % IV SOLN
INTRAVENOUS | Status: DC
  Administered 2016-03-20 – 2016-03-23 (×6): via INTRAVENOUS

## 2016-03-20 MED ORDER — ACETAMINOPHEN 10 MG/ML IV SOLN
1000.0000 mg | Freq: Once | INTRAVENOUS | Status: AC
  Administered 2016-03-20: 1000 mg via INTRAVENOUS
  Filled 2016-03-20: qty 100

## 2016-03-20 MED ORDER — BUPIVACAINE HCL (PF) 0.75 % IJ SOLN
INTRAMUSCULAR | Status: DC | PRN
  Administered 2016-03-20: 12.5 mg

## 2016-03-20 MED ORDER — METOCLOPRAMIDE HCL 5 MG/ML IJ SOLN: 5.0000 mg | Freq: Three times a day (TID) | INTRAMUSCULAR | Status: DC | PRN

## 2016-03-20 MED ORDER — MORPHINE SULFATE (PF) 2 MG/ML IV SOLN
0.5000 mg | INTRAVENOUS | Status: DC | PRN
  Administered 2016-03-20: 0.5 mg via INTRAVENOUS
  Filled 2016-03-20: qty 1

## 2016-03-20 MED ORDER — MENTHOL 3 MG MT LOZG: 1.0000 | LOZENGE | OROMUCOSAL | Status: DC | PRN

## 2016-03-20 MED ORDER — ASPIRIN EC 325 MG PO TBEC
325.0000 mg | DELAYED_RELEASE_TABLET | Freq: Every day | ORAL | Status: DC
  Administered 2016-03-21 – 2016-03-24 (×4): 325 mg via ORAL
  Filled 2016-03-20 (×4): qty 1

## 2016-03-20 MED ORDER — MIDAZOLAM HCL 2 MG/2ML IJ SOLN
INTRAMUSCULAR | Status: AC
  Filled 2016-03-20: qty 2

## 2016-03-20 MED ORDER — HYDROCODONE-ACETAMINOPHEN 5-325 MG PO TABS: 1.0000 | ORAL_TABLET | Freq: Four times a day (QID) | ORAL | Status: DC | PRN

## 2016-03-20 MED ORDER — CEFAZOLIN SODIUM-DEXTROSE 2-4 GM/100ML-% IV SOLN
INTRAVENOUS | Status: AC
  Filled 2016-03-20: qty 100

## 2016-03-20 MED ORDER — PROPOFOL 500 MG/50ML IV EMUL
INTRAVENOUS | Status: DC | PRN
  Administered 2016-03-20: 25 ug/kg/min via INTRAVENOUS

## 2016-03-20 MED ORDER — BUPIVACAINE-EPINEPHRINE (PF) 0.5% -1:200000 IJ SOLN
INTRAMUSCULAR | Status: DC | PRN
  Administered 2016-03-20: 60 mL

## 2016-03-20 MED ORDER — PHENYLEPHRINE HCL 10 MG/ML IJ SOLN
INTRAMUSCULAR | Status: DC | PRN
  Administered 2016-03-20 (×4): 80 ug via INTRAVENOUS

## 2016-03-20 MED ORDER — CEFAZOLIN SODIUM-DEXTROSE 2-4 GM/100ML-% IV SOLN
2.0000 g | INTRAVENOUS | Status: AC
  Administered 2016-03-20: 2 g via INTRAVENOUS

## 2016-03-20 MED ORDER — SENNOSIDES-DOCUSATE SODIUM 8.6-50 MG PO TABS: 1.0000 | ORAL_TABLET | Freq: Every evening | ORAL | Status: DC | PRN

## 2016-03-20 MED ORDER — MIDAZOLAM HCL 5 MG/5ML IJ SOLN
INTRAMUSCULAR | Status: DC | PRN
Start: 2016-03-20 — End: 2016-03-20
  Administered 2016-03-20 (×2): 1 mg via INTRAVENOUS

## 2016-03-20 MED ORDER — LEVOTHYROXINE SODIUM 25 MCG PO TABS
25.0000 ug | ORAL_TABLET | Freq: Every day | ORAL | Status: DC
  Administered 2016-03-21 – 2016-03-24 (×4): 25 ug via ORAL
  Filled 2016-03-20 (×4): qty 1

## 2016-03-20 MED ORDER — POVIDONE-IODINE 10 % EX SWAB: 2.0000 "application " | Freq: Once | CUTANEOUS | Status: DC

## 2016-03-20 MED ORDER — EPHEDRINE SULFATE 50 MG/ML IJ SOLN
INTRAMUSCULAR | Status: DC | PRN
  Administered 2016-03-20 (×3): 10 mg via INTRAVENOUS

## 2016-03-20 MED ORDER — AMLODIPINE BESYLATE 5 MG PO TABS
5.0000 mg | ORAL_TABLET | Freq: Every day | ORAL | Status: DC
  Administered 2016-03-21 – 2016-03-24 (×4): 5 mg via ORAL
  Filled 2016-03-20 (×4): qty 1

## 2016-03-20 MED ORDER — MORPHINE SULFATE (PF) 2 MG/ML IV SOLN: 1.0000 mg | INTRAVENOUS | Status: DC | PRN

## 2016-03-20 MED ORDER — PROPOFOL 10 MG/ML IV BOLUS: INTRAVENOUS | Status: DC | PRN

## 2016-03-20 MED ORDER — BISACODYL 10 MG RE SUPP: 10.0000 mg | Freq: Every day | RECTAL | Status: DC | PRN

## 2016-03-20 MED ORDER — SODIUM CHLORIDE 0.9 % IV SOLN: Freq: Once | INTRAVENOUS | Status: DC

## 2016-03-20 MED ORDER — SODIUM CHLORIDE 0.9 % IR SOLN
Status: DC | PRN
  Administered 2016-03-20: 1000 mL

## 2016-03-20 MED ORDER — METOPROLOL SUCCINATE ER 50 MG PO TB24
50.0000 mg | ORAL_TABLET | Freq: Every day | ORAL | Status: DC
  Administered 2016-03-21 – 2016-03-24 (×4): 50 mg via ORAL
  Filled 2016-03-20 (×4): qty 1

## 2016-03-20 MED ORDER — DONEPEZIL HCL 5 MG PO TABS
10.0000 mg | ORAL_TABLET | Freq: Every day | ORAL | Status: DC
  Administered 2016-03-20 – 2016-03-23 (×4): 10 mg via ORAL
  Filled 2016-03-20 (×4): qty 2

## 2016-03-20 MED ORDER — ONDANSETRON HCL 4 MG PO TABS: 4.0000 mg | ORAL_TABLET | Freq: Four times a day (QID) | ORAL | Status: DC | PRN

## 2016-03-20 MED ORDER — FENTANYL CITRATE (PF) 100 MCG/2ML IJ SOLN
50.0000 ug | Freq: Once | INTRAMUSCULAR | Status: AC
  Administered 2016-03-20: 50 ug via INTRAVENOUS
  Filled 2016-03-20: qty 2

## 2016-03-20 MED ORDER — TRAMADOL HCL 50 MG PO TABS: 50.0000 mg | ORAL_TABLET | Freq: Once | ORAL | Status: DC

## 2016-03-20 MED ORDER — MIDAZOLAM HCL 2 MG/2ML IJ SOLN
1.0000 mg | INTRAMUSCULAR | Status: DC | PRN
  Administered 2016-03-20 (×2): 2 mg via INTRAVENOUS

## 2016-03-20 MED ORDER — DOCUSATE SODIUM 100 MG PO CAPS
100.0000 mg | ORAL_CAPSULE | Freq: Two times a day (BID) | ORAL | Status: DC
  Administered 2016-03-20 – 2016-03-24 (×5): 100 mg via ORAL
  Filled 2016-03-20 (×6): qty 1

## 2016-03-20 MED ORDER — BUPIVACAINE-EPINEPHRINE (PF) 0.5% -1:200000 IJ SOLN
INTRAMUSCULAR | Status: AC
  Filled 2016-03-20: qty 60

## 2016-03-20 MED ORDER — SIMVASTATIN 20 MG PO TABS
40.0000 mg | ORAL_TABLET | Freq: Every day | ORAL | Status: DC
  Administered 2016-03-21 – 2016-03-24 (×4): 40 mg via ORAL
  Filled 2016-03-20 (×4): qty 2

## 2016-03-20 MED ORDER — METOCLOPRAMIDE HCL 5 MG PO TABS
5.0000 mg | ORAL_TABLET | Freq: Three times a day (TID) | ORAL | Status: DC | PRN
  Filled 2016-03-20: qty 2

## 2016-03-20 MED ORDER — MIDAZOLAM HCL 2 MG/2ML IJ SOLN
INTRAMUSCULAR | Status: AC
Start: 2016-03-20 — End: 2016-03-20
  Filled 2016-03-20: qty 2

## 2016-03-20 MED ORDER — PHENOL 1.4 % MT LIQD
1.0000 | OROMUCOSAL | Status: DC | PRN
Start: 2016-03-20 — End: 2016-03-24

## 2016-03-20 SURGICAL SUPPLY — 49 items
BAG HAMPER (MISCELLANEOUS) ×3 IMPLANT
BIT DRILL 5 ACE CANN QC (BIT) ×3 IMPLANT
BLADE HEX COATED 2.75 (ELECTRODE) ×3 IMPLANT
BNDG GAUZE ELAST 4 BULKY (GAUZE/BANDAGES/DRESSINGS) ×3 IMPLANT
CHLORAPREP W/TINT 26ML (MISCELLANEOUS) ×3 IMPLANT
CLOTH BEACON ORANGE TIMEOUT ST (SAFETY) ×3 IMPLANT
COVER LIGHT HANDLE STERIS (MISCELLANEOUS) ×12 IMPLANT
DECANTER SPIKE VIAL GLASS SM (MISCELLANEOUS) ×6 IMPLANT
DRAPE STERI IOBAN 125X83 (DRAPES) ×3 IMPLANT
DRESSING ALLEVYN LIFE SACRUM (GAUZE/BANDAGES/DRESSINGS) ×3 IMPLANT
DRESSING MEPILEX BORDER 6X8 (GAUZE/BANDAGES/DRESSINGS) ×1 IMPLANT
DRSG MEPILEX BORDER 6X8 (GAUZE/BANDAGES/DRESSINGS) ×3
GLOVE BIOGEL M 7.0 STRL (GLOVE) ×9 IMPLANT
GLOVE BIOGEL PI IND STRL 7.0 (GLOVE) ×4 IMPLANT
GLOVE BIOGEL PI INDICATOR 7.0 (GLOVE) ×8
GLOVE SKINSENSE NS SZ8.0 LF (GLOVE) ×2
GLOVE SKINSENSE STRL SZ8.0 LF (GLOVE) ×1 IMPLANT
GLOVE SS N UNI LF 8.5 STRL (GLOVE) ×3 IMPLANT
GOWN STRL REUS W/TWL LRG LVL3 (GOWN DISPOSABLE) ×12 IMPLANT
GOWN STRL REUS W/TWL XL LVL3 (GOWN DISPOSABLE) ×3 IMPLANT
INST SET MAJOR BONE (KITS) ×3 IMPLANT
KIT BLADEGUARD II DBL (SET/KITS/TRAYS/PACK) ×3 IMPLANT
KIT ROOM TURNOVER APOR (KITS) ×3 IMPLANT
MANIFOLD NEPTUNE II (INSTRUMENTS) ×3 IMPLANT
MARKER SKIN DUAL TIP RULER LAB (MISCELLANEOUS) ×3 IMPLANT
NEEDLE HYPO 21X1.5 SAFETY (NEEDLE) ×3 IMPLANT
NEEDLE SPNL 18GX3.5 QUINCKE PK (NEEDLE) ×3 IMPLANT
NS IRRIG 1000ML POUR BTL (IV SOLUTION) ×3 IMPLANT
PACK BASIC III (CUSTOM PROCEDURE TRAY) ×2
PACK SRG BSC III STRL LF ECLPS (CUSTOM PROCEDURE TRAY) ×1 IMPLANT
PENCIL HANDSWITCHING (ELECTRODE) ×3 IMPLANT
PIN THREADED GUIDE ACE (PIN) ×9 IMPLANT
SCREW CANN 6.5 65MM (Screw) ×3 IMPLANT
SCREW CANN 6.5 70MM (Screw) ×2 IMPLANT
SCREW CANN 6.5 75MM (Screw) ×2 IMPLANT
SCREW CANN LG 6.5 FLT 70X22 (Screw) ×1 IMPLANT
SCREW CANN LG 6.5 FLT 75X22 (Screw) ×1 IMPLANT
SET BASIN LINEN APH (SET/KITS/TRAYS/PACK) ×3 IMPLANT
SLING ARM FOAM STRAP MED (SOFTGOODS) ×3 IMPLANT
SPONGE LAP 18X18 X RAY DECT (DISPOSABLE) ×3 IMPLANT
STAPLER VISISTAT 35W (STAPLE) ×3 IMPLANT
SUT MNCRL 0 VIOLET CTX 36 (SUTURE) ×1 IMPLANT
SUT MON AB 0 CT1 (SUTURE) ×3 IMPLANT
SUT MON AB 2-0 CT1 36 (SUTURE) ×3 IMPLANT
SUT MONOCRYL 0 CTX 36 (SUTURE) ×2
SYR 30ML LL (SYRINGE) ×3 IMPLANT
SYR BULB IRRIGATION 50ML (SYRINGE) ×6 IMPLANT
TOWEL OR 17X26 4PK STRL BLUE (TOWEL DISPOSABLE) ×3 IMPLANT
YANKAUER SUCT BULB TIP 10FT TU (MISCELLANEOUS) ×3 IMPLANT

## 2016-03-20 NOTE — ED Notes (Signed)
Dr Knapp at bedside,  

## 2016-03-20 NOTE — Anesthesia Preprocedure Evaluation (Signed)
Anesthesia Evaluation  Patient identified by MRN, date of birth, ID band Patient confused  General Assessment Comment:Follows commands very slowly.  Reviewed: Allergy & Precautions, NPO status , Patient's Chart, lab work & pertinent test results, reviewed documented beta blocker date and time , Unable to perform ROS - Chart review only  Airway Mallampati: II  TM Distance: <3 FB     Dental  (+) Poor Dentition   Pulmonary neg pulmonary ROS,    breath sounds clear to auscultation       Cardiovascular hypertension, Pt. on medications and Pt. on home beta blockers  Rhythm:Regular Rate:Normal     Neuro/Psych    GI/Hepatic   Endo/Other  diabetes, Type 2Hypothyroidism   Renal/GU Renal disease     Musculoskeletal   Abdominal   Peds  Hematology   Anesthesia Other Findings   Reproductive/Obstetrics                             Anesthesia Physical Anesthesia Plan  ASA: III  Anesthesia Plan: Spinal   Post-op Pain Management:    Induction: Intravenous  Airway Management Planned: Simple Face Mask  Additional Equipment:   Intra-op Plan:   Post-operative Plan:   Informed Consent: I have reviewed the patients History and Physical, chart, labs and discussed the procedure including the risks, benefits and alternatives for the proposed anesthesia with the patient or authorized representative who has indicated his/her understanding and acceptance.     Plan Discussed with:   Anesthesia Plan Comments:         Anesthesia Quick Evaluation

## 2016-03-20 NOTE — ED Notes (Signed)
Pt is a resident of [facility] who was walking and fell, complaining of right thigh pain, positive distal pulses noted, pt has dementia, family is at bedside report that pt is at baseline,

## 2016-03-20 NOTE — Transfer of Care (Signed)
Immediate Anesthesia Transfer of Care Note  Patient: Paula [name]  Procedure(s) Performed: Procedure(s): INSERT SCREWS RIGHT HIP-CANNULATED PINNING RIGHT HIP (Right)  Patient Location: PACU  Anesthesia Type:Spinal  Level of Consciousness: awake and alert   Airway & Oxygen Therapy: Patient Spontanous Breathing and Patient connected to nasal cannula oxygen  Post-op Assessment: Report given to RN  Post vital signs: Reviewed and stable  Last Vitals:  Filed Vitals:   03/20/16 1135 03/20/16 1140  BP: 174/70 161/50  Pulse:    Temp:    Resp: 16 19    Complications: No apparent anesthesia complications

## 2016-03-20 NOTE — Consult Note (Signed)
Reason for Consult: right hip fracture  Referring Physician: [name], Paula [name] is an 82 y.o. female.  HPI: Fall mechanical c/o right hip pain no radiation x 1 day moderate pain cant weight bear   Past Medical History  Diagnosis Date  . Diabetes mellitus   . Hypertension   . Diagnosis unknown   . High cholesterol     Past Surgical History  Procedure Laterality Date  . Eye surgery      Family History  Problem Relation Age of Onset  . Hypertension Son   . Hypertension Grandchild     Social History:  reports that she has never smoked. She has never used smokeless tobacco. She reports that she does not drink alcohol or use illicit drugs.  Allergies: No Known Allergies  Medications: I have reviewed the patient's current medications.  Results for orders placed or performed during the hospital encounter of 03/19/16 (from the past 48 hour(s))  Comprehensive metabolic panel     Status: Abnormal   Collection Time: 03/20/16 12:30 AM  Result Value Ref Range   Sodium 139 135 - 145 mmol/L   Potassium 4.3 3.5 - 5.1 mmol/L   Chloride 104 101 - 111 mmol/L   CO2 26 22 - 32 mmol/L   Glucose, Bld 139 (H) 65 - 99 mg/dL   BUN 37 (H) 6 - 20 mg/dL   Creatinine, Ser 1.10 (H) 0.44 - 1.00 mg/dL   Calcium 8.8 (L) 8.9 - 10.3 mg/dL   Total Protein 6.5 6.5 - 8.1 g/dL   Albumin 3.7 3.5 - 5.0 g/dL   AST 45 (H) 15 - 41 U/L   ALT 22 14 - 54 U/L   Alkaline Phosphatase 76 38 - 126 U/L   Total Bilirubin 1.0 0.3 - 1.2 mg/dL   GFR calc non Af Amer 43 (L) >60 mL/min   GFR calc Af Amer 49 (L) >60 mL/min    Comment: (NOTE) The eGFR has been calculated using the CKD EPI equation. This calculation has not been validated in all clinical situations. eGFR's persistently <60 mL/min signify possible Chronic Kidney Disease.    Anion gap 9 5 - 15  CBC with Differential     Status: Abnormal   Collection Time: 03/20/16 12:30 AM  Result Value Ref Range   WBC 15.8 (H) 4.0 - 10.5 K/uL    Comment:  REPEATED TO VERIFY   RBC 4.14 3.87 - 5.11 MIL/uL   Hemoglobin 12.7 12.0 - 15.0 g/dL   HCT 38.0 36.0 - 46.0 %   MCV 91.8 78.0 - 100.0 fL   MCH 30.7 26.0 - 34.0 pg   MCHC 33.4 30.0 - 36.0 g/dL   RDW 13.4 11.5 - 15.5 %   Platelets 365 150 - 400 K/uL   Neutrophils Relative % 85 %   Neutro Abs 13.3 (H) 1.7 - 7.7 K/uL   Lymphocytes Relative 8 %   Lymphs Abs 1.3 0.7 - 4.0 K/uL   Monocytes Relative 7 %   Monocytes Absolute 1.2 (H) 0.1 - 1.0 K/uL   Eosinophils Relative 0 %   Eosinophils Absolute 0.0 0.0 - 0.7 K/uL   Basophils Relative 0 %   Basophils Absolute 0.0 0.0 - 0.1 K/uL   WBC Morphology INCREASED BANDS (>20% BANDS)   Sample to Blood Bank     Status: None   Collection Time: 03/20/16 12:30 AM  Result Value Ref Range   Blood Bank Specimen SAMPLE AVAILABLE FOR TESTING    Sample Expiration 03/23/2016  Protime-INR     Status: None   Collection Time: 03/20/16 12:30 AM  Result Value Ref Range   Prothrombin Time 14.0 11.6 - 15.2 seconds   INR 1.06 0.00 - 1.49  APTT     Status: None   Collection Time: 03/20/16 12:30 AM  Result Value Ref Range   aPTT 29 24 - 37 seconds  Type and screen [facility]     Status: None (Preliminary result)   Collection Time: 03/20/16 12:30 AM  Result Value Ref Range   ABO/RH(D) O POS    Antibody Screen NEG    Sample Expiration 03/23/2016    Unit Number [id number]    Blood Component Type RED CELLS,LR    Unit division 00    Status of Unit ALLOCATED    Transfusion Status OK TO TRANSFUSE    Crossmatch Result Compatible    Unit Number [id number]    Blood Component Type RED CELLS,LR    Unit division 00    Status of Unit ALLOCATED    Transfusion Status OK TO TRANSFUSE    Crossmatch Result Compatible   ABO/Rh     Status: None   Collection Time: 03/20/16 12:30 AM  Result Value Ref Range   ABO/RH(D) O POS   Urinalysis, Routine w reflex microscopic (not at [facility])     Status: Abnormal   Collection Time: 03/20/16  1:20 AM  Result Value Ref  Range   Color, Urine YELLOW YELLOW   APPearance CLEAR CLEAR   Specific Gravity, Urine 1.020 1.005 - 1.030   pH 6.0 5.0 - 8.0   Glucose, UA NEGATIVE NEGATIVE mg/dL   Hgb urine dipstick SMALL (A) NEGATIVE   Bilirubin Urine NEGATIVE NEGATIVE   Ketones, ur 15 (A) NEGATIVE mg/dL   Protein, ur TRACE (A) NEGATIVE mg/dL   Nitrite NEGATIVE NEGATIVE   Leukocytes, UA SMALL (A) NEGATIVE  Urine microscopic-add on     Status: Abnormal   Collection Time: 03/20/16  1:20 AM  Result Value Ref Range   Squamous Epithelial / LPF 6-30 (A) NONE SEEN   WBC, UA TOO NUMEROUS TO COUNT 0 - 5 WBC/hpf   RBC / HPF 0-5 0 - 5 RBC/hpf   Bacteria, UA MANY (A) NONE SEEN  CBC     Status: Abnormal   Collection Time: 03/20/16  5:34 AM  Result Value Ref Range   WBC 15.2 (H) 4.0 - 10.5 K/uL   RBC 4.20 3.87 - 5.11 MIL/uL   Hemoglobin 12.8 12.0 - 15.0 g/dL   HCT 38.7 36.0 - 46.0 %   MCV 92.1 78.0 - 100.0 fL   MCH 30.5 26.0 - 34.0 pg   MCHC 33.1 30.0 - 36.0 g/dL   RDW 13.3 11.5 - 15.5 %   Platelets 350 150 - 400 K/uL  Basic metabolic panel     Status: Abnormal   Collection Time: 03/20/16  5:34 AM  Result Value Ref Range   Sodium 139 135 - 145 mmol/L   Potassium 4.3 3.5 - 5.1 mmol/L   Chloride 105 101 - 111 mmol/L   CO2 22 22 - 32 mmol/L   Glucose, Bld 167 (H) 65 - 99 mg/dL   BUN 34 (H) 6 - 20 mg/dL   Creatinine, Ser 1.03 (H) 0.44 - 1.00 mg/dL   Calcium 8.9 8.9 - 10.3 mg/dL   GFR calc non Af Amer 46 (L) >60 mL/min   GFR calc Af Amer 53 (L) >60 mL/min    Comment: (NOTE) The eGFR has been  calculated using the CKD EPI equation. This calculation has not been validated in all clinical situations. eGFR's persistently <60 mL/min signify possible Chronic Kidney Disease.    Anion gap 12 5 - 15  Glucose, capillary     Status: Abnormal   Collection Time: 03/20/16  8:11 AM  Result Value Ref Range   Glucose-Capillary 136 (H) 65 - 99 mg/dL   Comment 1 Notify RN    Comment 2 Document in Chart   Surgical pcr screen      Status: None   Collection Time: 03/20/16  8:30 AM  Result Value Ref Range   MRSA, PCR NEGATIVE NEGATIVE   Staphylococcus aureus NEGATIVE NEGATIVE    Comment:        The Xpert SA Assay (FDA approved for NASAL specimens in patients over 57 years of age), is one component of a comprehensive surveillance program.  Test performance has been validated by [facility] for patients greater than or equal to 7 year old. It is not intended to diagnose infection nor to guide or monitor treatment.   Glucose, capillary     Status: Abnormal   Collection Time: 03/20/16 10:40 AM  Result Value Ref Range   Glucose-Capillary 118 (H) 65 - 99 mg/dL    Dg Chest 1 View  0/08/4460  CLINICAL DATA:  [place] [facility] tonight. EXAM: CHEST 1 VIEW COMPARISON:  None. FINDINGS: A single AP view of the chest demonstrates no focal airspace consolidation or alveolar edema. The lungs are grossly clear. There is no large effusion or pneumothorax. Cardiac and mediastinal contours appear unremarkable. IMPRESSION: No active disease. Electronically Signed   By: Ellery Plunk M.D.   On: 03/20/2016 00:08   Dg Pelvis 1-2 Views  03/20/2016  CLINICAL DATA:  [place] tonight. EXAM: RIGHT FEMUR 2 VIEWS; PELVIS - 1-2 VIEW COMPARISON:  None. FINDINGS: A single AP view the pelvis demonstrates probable subcapital right hip fracture. No dislocation. No bone lesion or bony destruction to suggest pathologic basis the fracture. Pubic symphysis and sacroiliac joints appear intact. Remainder of the femur is intact. IMPRESSION: Probable subcapital right hip fracture. No dislocation Electronically Signed   By: Ellery Plunk M.D.   On: 03/20/2016 00:10   Ct Head Wo Contrast  03/20/2016  CLINICAL DATA:  Fall with altered level of consciousness. EXAM: CT HEAD WITHOUT CONTRAST CT CERVICAL SPINE WITHOUT CONTRAST TECHNIQUE: Multidetector CT imaging of the head and cervical spine was performed following the standard protocol without intravenous contrast.  Multiplanar CT image reconstructions of the cervical spine were also generated. COMPARISON:  None. FINDINGS: CT HEAD FINDINGS Diffuse atrophy present as well as moderate small vessel ischemic changes in the periventricular white matter. The brain demonstrates no evidence of hemorrhage, infarction, edema, mass effect, extra-axial fluid collection, hydrocephalus or mass lesion. The skull is unremarkable. CT CERVICAL SPINE FINDINGS The cervical spine shows normal alignment. There is no evidence of acute fracture or subluxation. No soft tissue swelling or hematoma is identified. Spondylosis present at C4-5, C5-6 and C6-7. No bony lesions identified. The visualized airway is normally patent. In the visualized left lobe of the thyroid gland, focal nodularity is seen measuring approximately 1.3 cm. IMPRESSION: 1. Small vessel disease and atrophy of the brain. No acute findings by head CT. 2. No acute cervical injury identified. Cervical spondylosis present at C4-5, C5-6 and C6-7. 3. Incidental detection of left thyroid nodule measuring approximately 1.3 cm. At the patient's age, this is likely not of significance. Elective thyroid ultrasound could be considered. Electronically Signed  By: Aletta Edouard M.D.   On: 03/20/2016 00:14   Ct Cervical Spine Wo Contrast  03/20/2016  CLINICAL DATA:  Fall with altered level of consciousness. EXAM: CT HEAD WITHOUT CONTRAST CT CERVICAL SPINE WITHOUT CONTRAST TECHNIQUE: Multidetector CT imaging of the head and cervical spine was performed following the standard protocol without intravenous contrast. Multiplanar CT image reconstructions of the cervical spine were also generated. COMPARISON:  None. FINDINGS: CT HEAD FINDINGS Diffuse atrophy present as well as moderate small vessel ischemic changes in the periventricular white matter. The brain demonstrates no evidence of hemorrhage, infarction, edema, mass effect, extra-axial fluid collection, hydrocephalus or mass lesion. The skull  is unremarkable. CT CERVICAL SPINE FINDINGS The cervical spine shows normal alignment. There is no evidence of acute fracture or subluxation. No soft tissue swelling or hematoma is identified. Spondylosis present at C4-5, C5-6 and C6-7. No bony lesions identified. The visualized airway is normally patent. In the visualized left lobe of the thyroid gland, focal nodularity is seen measuring approximately 1.3 cm. IMPRESSION: 1. Small vessel disease and atrophy of the brain. No acute findings by head CT. 2. No acute cervical injury identified. Cervical spondylosis present at C4-5, C5-6 and C6-7. 3. Incidental detection of left thyroid nodule measuring approximately 1.3 cm. At the patient's age, this is likely not of significance. Elective thyroid ultrasound could be considered. Electronically Signed   By: Aletta Edouard M.D.   On: 03/20/2016 00:14   Ct Hip Right Wo Contrast  03/20/2016  CLINICAL DATA:  [place] [facility] at nursing home tonight. EXAM: CT OF THE RIGHT HIP WITHOUT CONTRAST TECHNIQUE: Multidetector CT imaging of the right hip was performed according to the standard protocol. Multiplanar CT image reconstructions were also generated. COMPARISON:  Radiographs 03/19/2016 FINDINGS: There is a subcapital right hip fracture with slight impaction. There is no dislocation. No bone lesion or bony destruction. No significant hematoma or other acute soft tissue abnormality. IMPRESSION: Subcapital right hip fracture Electronically Signed   By: Andreas Newport M.D.   On: 03/20/2016 01:34   Dg Femur, Min 2 Views Right  03/20/2016  CLINICAL DATA:  [place] tonight. EXAM: RIGHT FEMUR 2 VIEWS; PELVIS - 1-2 VIEW COMPARISON:  None. FINDINGS: A single AP view the pelvis demonstrates probable subcapital right hip fracture. No dislocation. No bone lesion or bony destruction to suggest pathologic basis the fracture. Pubic symphysis and sacroiliac joints appear intact. Remainder of the femur is intact. IMPRESSION: Probable subcapital right  hip fracture. No dislocation Electronically Signed   By: Andreas Newport M.D.   On: 03/20/2016 00:10    Review of Systems  Unable to perform ROS: mental acuity   Blood pressure 171/77, pulse 96, temperature 98.6 F (37 C), temperature source Oral, resp. rate 13, height '5\' 2"'$  (1.575 m), weight 106 lb 11.2 oz (48.399 kg), SpO2 100 %. Physical Exam  Constitutional: She is oriented to person, place, and time. She appears well-nourished.  HENT:  Head: Normocephalic.  Eyes: Right eye exhibits no discharge. Left eye exhibits no discharge. No scleral icterus.  Neck: Neck supple. No JVD present. No tracheal deviation present.  Cardiovascular: Intact distal pulses.   Respiratory: Effort normal. No stridor.  GI: Soft. She exhibits no distension.  Musculoskeletal:  Gait not able to walk  Upper extremities are normal  Alignment rom stablity and muscle tone  Left lower extremity is normal  Alignment rom stablity and muscle tone   Right hip is tender Pain rom Normal alignment   Muscle tone normal  All  joints are reduced   Neurological: She is alert and oriented to person, place, and time. She has normal reflexes. She exhibits normal muscle tone. Coordination normal.  Skin: Skin is warm and dry. No rash noted. No erythema. No pallor.  Psychiatric: She has a normal mood and affect. Her behavior is normal. Thought content normal.    Assessment/Plan: Right sub capital hip fracture no displacement   Plan insert cannulated screws (internal fixation) into right hip   Arther Abbott 03/20/2016, 11:32 AM

## 2016-03-20 NOTE — Progress Notes (Signed)
Patient seen and examined. Admitted after midnights secondary to mechanical fall and right hip fracture. Please refer to H&P written by Dr. Loleta Books for further info/details on admission. Plan is  For ORIF later today.  Plan: -follow hip protocol pathway -continue PRN pain meds  -plan is for ORIF later today (Dr. [name] consulted)  Barton Dubois [phone]

## 2016-03-20 NOTE — Consult Note (Signed)
   [facility] CM Inpatient Consult   03/20/2016  Paula [name] March 06, 1923 [id number]  Patient screened for potential [place] Management services. Patient is eligible for [place], however, hospital encounter notes reveal, patient resides in Assisted Living facility. [facility] Care Management services would not appropriate at this time. If patient's post hospital needs change please place a [facility] Care Management consult. Of note, [facility] Care Management services does not replace or interfere with any services that are needed or arranged by inpatient case management or social work. For questions please contact:   Royetta Crochet. Laymond Purser, RN, BSN, [place] [phone]) Business Cell  [phone]) Toll Free Office

## 2016-03-20 NOTE — Brief Op Note (Signed)
03/19/2016 - 03/20/2016  1:17 PM  PATIENT:  Paula [name]  82 y.o. female  PRE-OPERATIVE DIAGNOSIS:  Right Hip Fracture  POST-OPERATIVE DIAGNOSIS:  Right Hip Fracture  PROCEDURE:  Procedure(s): INSERT SCREWS RIGHT HIP-CANNULATED PINNING RIGHT HIP (Right)    DEPUY TITANIUM CANNULATED SCREWS X 3   SURGEON:  Surgeon(s) and Role:    * Carole Civil, MD - Primary  PHYSICIAN ASSISTANT:   ASSISTANTS: betty ashley    ANESTHESIA:   spinal  EBL:  Total I/O In: 800 [I.V.:800] Out: 675 [Urine:650; Blood:25]  BLOOD ADMINISTERED:none  DRAINS: none   LOCAL MEDICATIONS USED:  MARCAINE     SPECIMEN:  No Specimen  DISPOSITION OF SPECIMEN:  N/A  COUNTS:  YES  TOURNIQUET:  * No tourniquets in log *  DICTATION: .Dragon Dictation  PLAN OF CARE: Admit to inpatient   PATIENT DISPOSITION:  PACU - hemodynamically stable.   Delay start of Pharmacological VTE agent (>24hrs) due to surgical blood loss or risk of bleeding: yes

## 2016-03-20 NOTE — ED Notes (Signed)
Pt pulling at her gown, bp cuff, continues to state that her leg hurts, Dr Tomi Bamberger notified, additional orders given,

## 2016-03-20 NOTE — Op Note (Signed)
03/20/2016 operative note  Dr. [name] dictating  I was consulted on this patient is a 82 year old female with a right subcapital nondisplaced hip fracture  She was in stable medical condition with a hemoglobin of 12 and was cleared for surgery  Diagnosis right subcapital hip fracture  Postop diagnosis same  Procedure closed treatment internal fixation right hip  Implants 3 cannulated screws titanium from Depuy cannulated screw system  Surgeon Dr. [name] assisted by Simonne Maffucci  Spinal anesthetic  Operative findings nondisplaced valgus impacted subcapital right hip fracture  Details of procedure:  The patient was reevaluated in the holding area. The right hip was confirmed as a surgical site and complete chart review was completed radiographs were reviewed and the site was marked over the right hip.  She was taken to the operating room and had spinal anesthesia without complication  She was then placed on the fracture table with a perineal post. The right leg was placed in traction device in the left leg was placed in a padded leg holder.  Imaging was obtained with the C-arm. The leg was internally rotated slightly to improve fracture reduction  Right leg was prepped and draped sterilely.   This was followed by completion of the timeout procedure.  The incision was started at the tip of the greater trochanter and taken distally. The subtenon's tissue was divided in line with the skin incision. The fascia was identified and split in line with the skin incision exposing the vastus lateralis musculature. This was removed from the greater trochanter with a tendinous cuff and then subperiosteal dissection expose the proximal femur.  A multi hole pin guide was placed on the femur and 2 pins were placed in the superior aspect of the femoral head neck and checked with C-arm. Once these were satisfactory a third pin was placed at the inferior neck. X-rays confirmed satisfactory  position  Each pin was individually measured cortical drilling was performed and then screws were placed. The screws were tightened with a hand screwdriver and final radiographs confirmed position and reduction  The wounds were thoroughly irrigated and closed in layered fashion with 0 Monocryl suture  Staples were applied to the skin  2 injections 30 mL of Marcaine with epinephrine 1 subfascial and one in the skin were done at closure  The patient was taken recovery room in stable condition with the following postoperative plan  The patient can be weightbearing as tolerated Dressing change after postop day 1 Staples out postop day 14 First x-ray at postop day 14 Second x-ray at postop day 42 Third x-ray at postop day 84

## 2016-03-20 NOTE — Anesthesia Procedure Notes (Addendum)
Spinal Patient location during procedure: OR Start time: 03/20/2016 12:02 PM Staffing Resident/CRNA: Tressie Stalker E Performed by: resident/CRNA  Preanesthetic Checklist Completed: patient identified, site marked, surgical consent, pre-op evaluation, timeout performed, IV checked, risks and benefits discussed and monitors and equipment checked Spinal Block Patient position: right lateral decubitus Prep: Betadine Patient monitoring: heart rate, cardiac monitor, continuous pulse ox and blood pressure Approach: right paramedian Location: L3-4 Injection technique: single-shot Needle Needle type: Spinocan  Needle gauge: 22 G Needle length: 9 cm Assessment Sensory level: T8 Additional Notes ATTEMPTS:1 TRAY [id number] TRAY EXPIRATION DATE:11/2016

## 2016-03-20 NOTE — ED Notes (Signed)
Pt repositioned, comfort measures provided, family remain at bedside,

## 2016-03-20 NOTE — H&P (Addendum)
History and Physical  Patient Name: Paula [name]     [id number]    DOB: 1923-09-23    DOA: 03/19/2016 Referring physician: [name], MD PCP: Wende Neighbors, MD      Chief Complaint: Hip pain and fall  HPI: Paula [name] is a 82 y.o. female with a past medical history significant for dementia, hypothyroidism and HTN who presents with hip pain after a fall.  Level 5 caveat that patient is unable to provider her own history due to dementia and all history is collected from granddaughter, present at bedside.  The patient was in her normal state of health until this evening when she was walking out to the hall at her facility to check what time it was, had a mechanical fall and had immediate R hip pain and could not walk.  There was no preceding dizziness, palpitations, chest pain. The granddaughter notes that since the patient moved to [facility] one month ago, they believe the patient had been getting clorazepate nightly instead of when necessary, they had noticed her gait was getting "more unsteady" and they were planning to talk to the supervising M.D. about this.   In the ED, the patient was hemodynamically stable and a plain radiograph of the R hip showed a possible subcapital R femoral fracture, confirmed by CT.  The case was discussed with Dr. [name] who agreed to see the patient, and [facility] were asked to admit for medical management.    The patient denies active heart issues, angina, or history of MI. She does not typically exert to an equivalent of 4 METS.  She has no history of TIAs/CVAs, CAD, CHF, or DM treated with insulin. She has no history of COPD.  She has no history of prolonged steroid use.  She does not use alcohol, and has no history of withdrawal symptoms or delirium in the context of previous surgeries.  Anesthesia Specific concerns: Presence of loose teeth: None Anesthesia problems in past: None History of bleeding disorder: None  Review of Systems:  Pt complains of R hip  pain and back pain.  No chest pain or dyspnea or palpitations. All other systems negative except as just noted or noted in the history of present illness.         No Known Allergies  Prior to Admission medications   Medication Sig Start Date End Date Taking? Authorizing Provider  amLODipine (NORVASC) 5 MG tablet Take 5 mg by mouth daily.   Yes Historical Provider, MD  donepezil (ARICEPT) 10 MG tablet Take 10 mg by mouth at bedtime.   Yes Historical Provider, MD  clorazepate (TRANXENE) 7.5 MG tablet Take 7.5 mg by mouth daily as needed. FOR ANXIETY     Historical Provider, MD  levothyroxine (SYNTHROID, LEVOTHROID) 25 MCG tablet Take 25 mcg by mouth daily.      Historical Provider, MD  metoprolol (TOPROL-XL) 50 MG 24 hr tablet Take 50 mg by mouth daily.      Historical Provider, MD  simvastatin (ZOCOR) 40 MG tablet Take 40 mg by mouth daily.      Historical Provider, MD    Past Medical History  Diagnosis Date  . Diabetes mellitus   . Hypertension   . Diagnosis unknown     Past Surgical History  Procedure Laterality Date  . Eye surgery      Family history: Hypertension in son and grandchildren.  Social History: Patient lives in [place]. The patient has dementia, repeat result during conversations, but  remembers close family members. She does not use a cane or walker. She does not smoke. Her husband still alive, but has dementia, and her POA's are her 2 grandchildren.       Physical Exam: BP 129/49 mmHg  Pulse 86  Temp(Src) 99.2 F (37.3 C) (Tympanic)  Resp 18  SpO2 99% General appearance: Thin elderly adult female, alert and in moderate distress from pain.  Restless and agitated.    Eyes: Anicteric, conjunctiva pink, lids and lashes normal.     ENT: No nasal deformity, discharge, or epistaxis.  OP dry without lesions.  chin hirsutism. Lymph: No cervical, supraclavicular or axillary lymphadenopathy. Skin: Warm and dry.   Cardiac: RRR, nl S1-S2, no  murmurs appreciated.  Capillary refill is brisk.  JVP not visible.  No LE edema.  Radial and DP pulses 2+ and symmetric. Respiratory: Normal respiratory rate and rhythm.  CTAB without rales or wheezes. Abdomen: Abdomen soft without rigidity.  No TTP. No ascites, distension.   MSK: No deformities or effusions.  Complains of right hip pain. Neuro: Restless, agitated. Not oriented to place or time or situation. Speech is affected by dry mouth. Moves arms equally. Cranial nerves grossly intact.  Psych: Unable to assess, suspect mild delirium.      Labs on Admission:  The metabolic panel shows normal electrolytes, slightly elevated BUN to creatinine ratio, creatinine stable from 5 years ago. Transaminases and bilirubin normal. Coags normal. Urinalysis shows full field pyuria and bacteria. The complete blood count shows mild leukocytosis, no anemia or thrombocytopenia.   Radiological Exams on Admission: Personally reviewed: Dg Chest 1 View  03/20/2016  CLINICAL DATA:  [facility] tonight. EXAM: CHEST 1 VIEW COMPARISON:  None. FINDINGS: A single AP view of the chest demonstrates no focal airspace consolidation or alveolar edema. The lungs are grossly clear. There is no large effusion or pneumothorax. Cardiac and mediastinal contours appear unremarkable. IMPRESSION: No active disease. Electronically Signed   By: Andreas Newport M.D.   On: 03/20/2016 00:08   Dg Pelvis 1-2 Views  03/20/2016  CLINICAL DATA:  [facility] tonight. EXAM: RIGHT FEMUR 2 VIEWS; PELVIS - 1-2 VIEW COMPARISON:  None. FINDINGS: A single AP view the pelvis demonstrates probable subcapital right hip fracture. No dislocation. No bone lesion or bony destruction to suggest pathologic basis the fracture. Pubic symphysis and sacroiliac joints appear intact. Remainder of the femur is intact. IMPRESSION: Probable subcapital right hip fracture. No dislocation Electronically Signed   By: Andreas Newport M.D.   On: 03/20/2016 00:10   Ct Head Wo  Contrast  03/20/2016  CLINICAL DATA:  Fall with altered level of consciousness. EXAM: CT HEAD WITHOUT CONTRAST CT CERVICAL SPINE WITHOUT CONTRAST TECHNIQUE: Multidetector CT imaging of the head and cervical spine was performed following the standard protocol without intravenous contrast. Multiplanar CT image reconstructions of the cervical spine were also generated. COMPARISON:  None. FINDINGS: CT HEAD FINDINGS Diffuse atrophy present as well as moderate small vessel ischemic changes in the periventricular white matter. The brain demonstrates no evidence of hemorrhage, infarction, edema, mass effect, extra-axial fluid collection, hydrocephalus or mass lesion. The skull is unremarkable. CT CERVICAL SPINE FINDINGS The cervical spine shows normal alignment. There is no evidence of acute fracture or subluxation. No soft tissue swelling or hematoma is identified. Spondylosis present at C4-5, C5-6 and C6-7. No bony lesions identified. The visualized airway is normally patent. In the visualized left lobe of the thyroid gland, focal nodularity is seen measuring approximately 1.3 cm. IMPRESSION: 1. Small  vessel disease and atrophy of the brain. No acute findings by head CT. 2. No acute cervical injury identified. Cervical spondylosis present at C4-5, C5-6 and C6-7. 3. Incidental detection of left thyroid nodule measuring approximately 1.3 cm. At the patient's age, this is likely not of significance. Elective thyroid ultrasound could be considered. Electronically Signed   By: Aletta Edouard M.D.   On: 03/20/2016 00:14   Ct Cervical Spine Wo Contrast  03/20/2016  CLINICAL DATA:  Fall with altered level of consciousness. EXAM: CT HEAD WITHOUT CONTRAST CT CERVICAL SPINE WITHOUT CONTRAST TECHNIQUE: Multidetector CT imaging of the head and cervical spine was performed following the standard protocol without intravenous contrast. Multiplanar CT image reconstructions of the cervical spine were also generated. COMPARISON:  None.  FINDINGS: CT HEAD FINDINGS Diffuse atrophy present as well as moderate small vessel ischemic changes in the periventricular white matter. The brain demonstrates no evidence of hemorrhage, infarction, edema, mass effect, extra-axial fluid collection, hydrocephalus or mass lesion. The skull is unremarkable. CT CERVICAL SPINE FINDINGS The cervical spine shows normal alignment. There is no evidence of acute fracture or subluxation. No soft tissue swelling or hematoma is identified. Spondylosis present at C4-5, C5-6 and C6-7. No bony lesions identified. The visualized airway is normally patent. In the visualized left lobe of the thyroid gland, focal nodularity is seen measuring approximately 1.3 cm. IMPRESSION: 1. Small vessel disease and atrophy of the brain. No acute findings by head CT. 2. No acute cervical injury identified. Cervical spondylosis present at C4-5, C5-6 and C6-7. 3. Incidental detection of left thyroid nodule measuring approximately 1.3 cm. At the patient's age, this is likely not of significance. Elective thyroid ultrasound could be considered. Electronically Signed   By: Aletta Edouard M.D.   On: 03/20/2016 00:14   Ct Hip Right Wo Contrast  03/20/2016  CLINICAL DATA:  [facility] tonight. EXAM: CT OF THE RIGHT HIP WITHOUT CONTRAST TECHNIQUE: Multidetector CT imaging of the right hip was performed according to the standard protocol. Multiplanar CT image reconstructions were also generated. COMPARISON:  Radiographs 03/19/2016 FINDINGS: There is a subcapital right hip fracture with slight impaction. There is no dislocation. No bone lesion or bony destruction. No significant hematoma or other acute soft tissue abnormality. IMPRESSION: Subcapital right hip fracture Electronically Signed   By: Andreas Newport M.D.   On: 03/20/2016 01:34   Dg Femur, Min 2 Views Right  03/20/2016  CLINICAL DATA:  [facility] tonight. EXAM: RIGHT FEMUR 2 VIEWS; PELVIS - 1-2 VIEW COMPARISON:  None. FINDINGS: A single  AP view the pelvis demonstrates probable subcapital right hip fracture. No dislocation. No bone lesion or bony destruction to suggest pathologic basis the fracture. Pubic symphysis and sacroiliac joints appear intact. Remainder of the femur is intact. IMPRESSION: Probable subcapital right hip fracture. No dislocation Electronically Signed   By: Andreas Newport M.D.   On: 03/20/2016 00:10    EKG: Independently reviewed. Rate 87, QTC 474, normal sinus rhythm, no ST changes    Assessment and Plan: 1. Hip fracture: The patient will be seen by Dr. [name] in the morning, to evaluate for operative fixation of the RIGHT hip.   -Admit to med-surg bed -Hydrocodone-acetaminophen or morphine as tolerated for pain -Bed rest, apply ice, document sedation and vitals per Hip fracture protocol -NPO at midnight -MIVF   Overall, the patient is at low risk for the planned surgery.   Cardiac: Patient has a RCRI score of 0 (active cardiac condition, CHF, CAD, DM  treated with insulin, TIA/CVA, Cr > 2.0). The patient has no active cardiac symptoms.  She is sedentary, and her functional capacity is hard to estimate, but she has no previous history of coronary disease, and would be expected to be at average risk for cardiac complications from this intermediate risk procedure. -No further testing needed   2. HTN: -Continue amlodipine and BB and statin perioperatively  3. NIDDM: Previously on metformin, currently diet controlled. -CBG daily  4. UTI: Pyuria and bacteriuria on UA at presentation.   -Continue ceftriaxone -Follow culture  5. CKD stage III: At baseline.  Avoid hypotension or nephrotoxins.  6. Hypothyroidism: -Continue levothyroxine      DVT PPx: SCDs Diet: NPO at midnight  Consultants: Orthopedics Code Status: DO NOT RESUSCITATE Family Communication: Granddaughter, presetn at bedside  Medical decision making: What exists of the patient's previous chart was reviewed in depth and  the case was discussed with Dr. Tomi Bamberger. Patient seen 3:00 AM on 03/20/2016.  Disposition Plan:  Admit to Med surg for hip fracture.  Consult with Ortho tomorrow.    Edwin Dada [facility] Pager [phone]

## 2016-03-20 NOTE — ED Notes (Signed)
Hospitalist at bedside,

## 2016-03-20 NOTE — ED Notes (Addendum)
CONTACTS:  [place] Gloris [place]

## 2016-03-20 NOTE — Anesthesia Postprocedure Evaluation (Signed)
Anesthesia Post Note  Patient: Paula [name]  Procedure(s) Performed: Procedure(s) (LRB): INSERT SCREWS RIGHT HIP-CANNULATED PINNING RIGHT HIP (Right)  Patient location during evaluation: PACU Anesthesia Type: Spinal Post-procedure mental status: sleeping, arrousable. Pain management: pain level controlled Vital Signs Assessment: post-procedure vital signs reviewed and stable Respiratory status: spontaneous breathing Cardiovascular status: stable Postop Assessment: no signs of nausea or vomiting Anesthetic complications: no    Last Vitals:  Filed Vitals:   03/20/16 1330 03/20/16 1345  BP: 98/82 114/56  Pulse: 57 63  Temp:    Resp: 10 9    Last Pain:  Filed Vitals:   03/20/16 1352  PainSc: Asleep                 Analeigha Nauman

## 2016-03-21 DIAGNOSIS — R490 Dysphonia: Secondary | ICD-10-CM

## 2016-03-21 DIAGNOSIS — R1310 Dysphagia, unspecified: Secondary | ICD-10-CM

## 2016-03-21 LAB — BASIC METABOLIC PANEL
Anion gap: 11 (ref 5–15)
BUN: 18 mg/dL (ref 6–20)
CHLORIDE: 105 mmol/L (ref 101–111)
CO2: 23 mmol/L (ref 22–32)
CREATININE: 0.9 mg/dL (ref 0.44–1.00)
Calcium: 8.6 mg/dL — ABNORMAL LOW (ref 8.9–10.3)
GFR calc Af Amer: >60 mL/min (ref >60)
GFR calc non Af Amer: 54 mL/min — ABNORMAL LOW (ref >60)
Glucose, Bld: 123 mg/dL — ABNORMAL HIGH (ref 65–99)
Potassium: 3.8 mmol/L (ref 3.5–5.1)
SODIUM: 139 mmol/L (ref 135–145)

## 2016-03-21 LAB — URINE CULTURE

## 2016-03-21 LAB — CBC
HCT: 36.3 % (ref 36.0–46.0)
HEMOGLOBIN: 12.1 g/dL (ref 12.0–15.0)
MCH: 30.4 pg (ref 26.0–34.0)
MCHC: 33.3 g/dL (ref 30.0–36.0)
MCV: 91.2 fL (ref 78.0–100.0)
Platelets: 355 10*3/uL (ref 150–400)
RBC: 3.98 MIL/uL (ref 3.87–5.11)
RDW: 13.5 % (ref 11.5–15.5)
WBC: 14.3 10*3/uL — ABNORMAL HIGH (ref 4.0–10.5)

## 2016-03-21 MED ORDER — ENSURE ENLIVE PO LIQD
237.0000 mL | Freq: Two times a day (BID) | ORAL | Status: DC
  Administered 2016-03-21 – 2016-03-24 (×6): 237 mL via ORAL

## 2016-03-21 MED ORDER — MORPHINE SULFATE (PF) 2 MG/ML IV SOLN: 1.0000 mg | INTRAVENOUS | Status: DC | PRN

## 2016-03-21 NOTE — Progress Notes (Signed)
[facility] PROGRESS NOTE  Paula [name] [id number] DOB: 1923-09-07 DOA: 03/19/2016 PCP: Wende Neighbors, MD  Assessment/Plan: 1. Hip fracture: -s/p ORIF on 4/6 -on full dose aspirin for DVT prophylaxis -ok to start weight bearing as tolerated -will follow post operative rec's from orthopedic service  -PT recommending [place] services at discharge  2. HTN: -Continue amlodipine and BB perioperatively -follow VS and adjust regimen as needed   3. NIDDM: -Previously on metformin, currently diet controlled. -check CBG daily  4. UTI: -Pyuria and bacteriuria on UA at presentation. Also with elevated WBC's and some worsening confusion. Unable to assess for dysuria given mentation. -started on ceftriaxone empirically -no fever -cx demonstrating multiple species and recommending recollection of urine specimen if indicated  -will follow response  5. CKD stage III: -At baseline.  -Avoid hypotension or nephrotoxins. -monitor trend   6. Hypothyroidism: -Continue levothyroxine  7. Hoarseness/dysphagia -most likely from intubation, anesthesia and pain meds -will stretch pain meds as much as possible -SPL evaluation requested -continue supportive care   8.HLD -continue statins   Code Status: DNR Family Communication: no family at bedside Disposition Plan: to be determine; will continue supportive care; ask SPL to assess for swallowing and speech impairment. Minimize narcotics as much as possible and continue providing supportive care and IV rocephin.   Consultants:  Dr. [name] (orthopedic service)  Procedures:  Right hip ORIF 03/20/16  Antibiotics:  Rocephin   HPI/Subjective: Afebrile, no CP or SOB. Patient oriented X 1 and able to follow simple commands. Hoarse on examination and per family members more confused that at her usual demented baseline and with difficulty speaking/swallowing.  Objective: Filed Vitals:   03/21/16 0406 03/21/16 0948  BP: 159/82   Pulse:  101 106  Temp: 99.2 F (37.3 C)   Resp: 18     Intake/Output Summary (Last 24 hours) at 03/21/16 1357 Last data filed at 03/21/16 1051  Gross per 24 hour  Intake      0 ml  Output   2450 ml  Net  -2450 ml   Filed Weights   03/20/16 0514  Weight: 48.399 kg (106 lb 11.2 oz)    Exam:   General:  Afebrile, oriented X1 and with poor insight. No CP and no SOB. Per family members patient is more confused and with abnormal speech in comparison to her baseline. No focal deficit appreciated. Pain appears to be controlled.  Cardiovascular: S1 and S2, no rubs or gallops  Respiratory: good air movement, no wheezing   Abdomen: soft, NT, ND, positive BS  Musculoskeletal: no edema, no cyanosis; clean dressing on right hip  Data Reviewed: Basic Metabolic Panel:  Recent Labs Lab 03/20/16 0030 03/20/16 0534 03/21/16 0551  NA 139 139 139  K 4.3 4.3 3.8  CL 104 105 105  CO2 26 22 23   GLUCOSE 139* 167* 123*  BUN 37* 34* 18  CREATININE 1.10* 1.03* 0.90  CALCIUM 8.8* 8.9 8.6*   Liver Function Tests:  Recent Labs Lab 03/20/16 0030  AST 45*  ALT 22  ALKPHOS 76  BILITOT 1.0  PROT 6.5  ALBUMIN 3.7   CBC:  Recent Labs Lab 03/20/16 0030 03/20/16 0534 03/21/16 0551  WBC 15.8* 15.2* 14.3*  NEUTROABS 13.3*  --   --   HGB 12.7 12.8 12.1  HCT 38.0 38.7 36.3  MCV 91.8 92.1 91.2  PLT 365 350 355   ProBNP (last 3 results) No results for input(s): PROBNP in the last 8760 hours.  CBG:  Recent Labs  Lab 03/20/16 0811 03/20/16 1040 03/20/16 1326  GLUCAP 136* 118* 129*    Recent Results (from the past 240 hour(s))  Urine culture     Status: None   Collection Time: 03/20/16  1:20 AM  Result Value Ref Range Status   Specimen Description URINE, CATHETERIZED  Final   Special Requests NONE  Final   Culture MULTIPLE SPECIES PRESENT, SUGGEST RECOLLECTION  Final   Report Status 03/21/2016 FINAL  Final  Surgical pcr screen     Status: None   Collection Time: 03/20/16  8:30  AM  Result Value Ref Range Status   MRSA, PCR NEGATIVE NEGATIVE Final   Staphylococcus aureus NEGATIVE NEGATIVE Final    Comment:        The Xpert SA Assay (FDA approved for NASAL specimens in patients over 94 years of age), is one component of a comprehensive surveillance program.  Test performance has been validated by [facility] for patients greater than or equal to 65 year old. It is not intended to diagnose infection nor to guide or monitor treatment.      Studies: Dg Chest 1 View  03/20/2016  CLINICAL DATA:  [facility] tonight. EXAM: CHEST 1 VIEW COMPARISON:  None. FINDINGS: A single AP view of the chest demonstrates no focal airspace consolidation or alveolar edema. The lungs are grossly clear. There is no large effusion or pneumothorax. Cardiac and mediastinal contours appear unremarkable. IMPRESSION: No active disease. Electronically Signed   By: Andreas Newport M.D.   On: 03/20/2016 00:08   Dg Pelvis 1-2 Views  03/20/2016  CLINICAL DATA:  [facility] tonight. EXAM: RIGHT FEMUR 2 VIEWS; PELVIS - 1-2 VIEW COMPARISON:  None. FINDINGS: A single AP view the pelvis demonstrates probable subcapital right hip fracture. No dislocation. No bone lesion or bony destruction to suggest pathologic basis the fracture. Pubic symphysis and sacroiliac joints appear intact. Remainder of the femur is intact. IMPRESSION: Probable subcapital right hip fracture. No dislocation Electronically Signed   By: Andreas Newport M.D.   On: 03/20/2016 00:10   Ct Head Wo Contrast  03/20/2016  CLINICAL DATA:  Fall with altered level of consciousness. EXAM: CT HEAD WITHOUT CONTRAST CT CERVICAL SPINE WITHOUT CONTRAST TECHNIQUE: Multidetector CT imaging of the head and cervical spine was performed following the standard protocol without intravenous contrast. Multiplanar CT image reconstructions of the cervical spine were also generated. COMPARISON:  None. FINDINGS: CT HEAD FINDINGS Diffuse atrophy present as well as moderate  small vessel ischemic changes in the periventricular white matter. The brain demonstrates no evidence of hemorrhage, infarction, edema, mass effect, extra-axial fluid collection, hydrocephalus or mass lesion. The skull is unremarkable. CT CERVICAL SPINE FINDINGS The cervical spine shows normal alignment. There is no evidence of acute fracture or subluxation. No soft tissue swelling or hematoma is identified. Spondylosis present at C4-5, C5-6 and C6-7. No bony lesions identified. The visualized airway is normally patent. In the visualized left lobe of the thyroid gland, focal nodularity is seen measuring approximately 1.3 cm. IMPRESSION: 1. Small vessel disease and atrophy of the brain. No acute findings by head CT. 2. No acute cervical injury identified. Cervical spondylosis present at C4-5, C5-6 and C6-7. 3. Incidental detection of left thyroid nodule measuring approximately 1.3 cm. At the patient's age, this is likely not of significance. Elective thyroid ultrasound could be considered. Electronically Signed   By: Aletta Edouard M.D.   On: 03/20/2016 00:14   Ct Cervical Spine Wo Contrast  03/20/2016  CLINICAL DATA:  Fall with  altered level of consciousness. EXAM: CT HEAD WITHOUT CONTRAST CT CERVICAL SPINE WITHOUT CONTRAST TECHNIQUE: Multidetector CT imaging of the head and cervical spine was performed following the standard protocol without intravenous contrast. Multiplanar CT image reconstructions of the cervical spine were also generated. COMPARISON:  None. FINDINGS: CT HEAD FINDINGS Diffuse atrophy present as well as moderate small vessel ischemic changes in the periventricular white matter. The brain demonstrates no evidence of hemorrhage, infarction, edema, mass effect, extra-axial fluid collection, hydrocephalus or mass lesion. The skull is unremarkable. CT CERVICAL SPINE FINDINGS The cervical spine shows normal alignment. There is no evidence of acute fracture or subluxation. No soft tissue swelling or  hematoma is identified. Spondylosis present at C4-5, C5-6 and C6-7. No bony lesions identified. The visualized airway is normally patent. In the visualized left lobe of the thyroid gland, focal nodularity is seen measuring approximately 1.3 cm. IMPRESSION: 1. Small vessel disease and atrophy of the brain. No acute findings by head CT. 2. No acute cervical injury identified. Cervical spondylosis present at C4-5, C5-6 and C6-7. 3. Incidental detection of left thyroid nodule measuring approximately 1.3 cm. At the patient's age, this is likely not of significance. Elective thyroid ultrasound could be considered. Electronically Signed   By: Aletta Edouard M.D.   On: 03/20/2016 00:14   Ct Hip Right Wo Contrast  03/20/2016  CLINICAL DATA:  [facility] home tonight. EXAM: CT OF THE RIGHT HIP WITHOUT CONTRAST TECHNIQUE: Multidetector CT imaging of the right hip was performed according to the standard protocol. Multiplanar CT image reconstructions were also generated. COMPARISON:  Radiographs 03/19/2016 FINDINGS: There is a subcapital right hip fracture with slight impaction. There is no dislocation. No bone lesion or bony destruction. No significant hematoma or other acute soft tissue abnormality. IMPRESSION: Subcapital right hip fracture Electronically Signed   By: Andreas Newport M.D.   On: 03/20/2016 01:34   Dg Hip Operative Unilat With Pelvis Right  03/20/2016  CLINICAL DATA:  ORIF femoral neck fracture EXAM: OPERATIVE right HIP (WITH PELVIS IF PERFORMED)  VIEWS TECHNIQUE: Fluoroscopic spot image(s) were submitted for interpretation post-operatively. COMPARISON:  CT same day FINDINGS: Multiple C-arm images show placement of 3 Knowles type pins for treatment of a femoral neck fracture. Pins appear well positioned and there is no radiographically detectable complication. IMPRESSION: Placement of 3 cannulated pins for treatment of a femoral neck fracture. Electronically Signed   By: Nelson Chimes M.D.   On:  03/20/2016 13:17   Dg Femur, Min 2 Views Right  03/20/2016  CLINICAL DATA:  [facility] tonight. EXAM: RIGHT FEMUR 2 VIEWS; PELVIS - 1-2 VIEW COMPARISON:  None. FINDINGS: A single AP view the pelvis demonstrates probable subcapital right hip fracture. No dislocation. No bone lesion or bony destruction to suggest pathologic basis the fracture. Pubic symphysis and sacroiliac joints appear intact. Remainder of the femur is intact. IMPRESSION: Probable subcapital right hip fracture. No dislocation Electronically Signed   By: Andreas Newport M.D.   On: 03/20/2016 00:10    Scheduled Meds: . acetaminophen  1,000 mg Intravenous 4 times per day  . amLODipine  5 mg Oral Daily  . aspirin EC  325 mg Oral Q breakfast  . cefTRIAXone (ROCEPHIN)  IV  1 g Intravenous Q24H  . docusate sodium  100 mg Oral BID  . donepezil  10 mg Oral QHS  . levothyroxine  25 mcg Oral QAC breakfast  . metoprolol succinate  50 mg Oral Daily  . polyethylene glycol  17 g Oral  Daily  . simvastatin  40 mg Oral Daily  . traMADol  50 mg Oral Once   Continuous Infusions: . sodium chloride 75 mL/hr at 03/20/16 2216    Principal Problem:   Subcapital fracture of right hip [facility]) Active Problems:   Essential hypertension   Controlled type 2 diabetes mellitus without complication, without long-term current use of insulin (HCC)   Hypothyroidism, acquired   CKD (chronic kidney disease) stage 3, GFR 30-59 ml/min   UTI (lower urinary tract infection)   Subcapital fracture of hip ([employer])   Hip fracture ([place])    Time spent: 35 minutes    Barton Dubois  [facility] Pager [phone]. If 7PM-7AM, please contact night-coverage at [email or web address], password [facility] 03/21/2016, 1:57 PM  LOS: 1 day

## 2016-03-21 NOTE — Anesthesia Postprocedure Evaluation (Signed)
Anesthesia Post Note  Patient: Paula [name]  Procedure(s) Performed: Procedure(s) (LRB): INSERT SCREWS RIGHT HIP-CANNULATED PINNING RIGHT HIP (Right)  Patient location during evaluation: Nursing Unit Anesthesia Type: Spinal Level of consciousness: awake and confused Pain management: pain level controlled Respiratory status: spontaneous breathing Cardiovascular status: blood pressure returned to baseline Postop Assessment: no signs of nausea or vomiting Anesthetic complications: no    Last Vitals:  Filed Vitals:   03/21/16 0030 03/21/16 0406  BP: 138/79 159/82  Pulse: 78 101  Temp: 36.9 C 37.3 C  Resp: 16 18    Last Pain:  Filed Vitals:   03/21/16 0406  PainSc: Asleep                 Rawad Bochicchio

## 2016-03-21 NOTE — Evaluation (Signed)
Physical Therapy Evaluation Patient Details Name: Paula [name] MRN: [id number] DOB: September 01, 1923 Today's Date: 03/21/2016   History of Present Illness  Paula [name] is a 82yo white female who sustained a fall recently at a factility, immediately unable to ambulate. The patient has advanced demetia, previously at home with assistance, and at [facility] unit now for 1 month. PLOF in mobility was without deficit, however family reports that pt was getting a sleep aid  QD wherein it was supposed to be PRN, and pt appears more 'zombie like' as of recent. Pt sustains a fracture, underwent ORIF of R hip on 4/6. [place] over th ephone reports exellent management of stairs/community distances without AD 1MA.  PMH: dementia, HTN, DM (diet controlled), CKD3, UTI.     Clinical Impression  At evaluation, pt is received seated in geri-chair upon entry, family/caregiver present. The pt is awake, pleasant, and speaking continuously with mild perseveration on leaving to visit her sister. No acute distress noted at this time, however pain with all weightbearing attempts. The pt is alert and oriented to person only, pleasant, and conversational. The patient is not following simple commands at this time, and appears to not acknowledge most spoke language at this time, except for short colloquial phrases that are on topic with her talking. Some noted dysarthria during session, suspected from poor motor control of the tongue with altered phonation of vowel sounds: grandson states no prior speech deficits, I notified MD who will consult with SLP. PLOF as reports from grandson was community distances and stairs without limits and without AD. Therex attempted, but lack of participation results in PROM only. Patient participated with transfers when physically cued, but self limited due to pain stating 'I've been sitting for too long, now my leg hurts, I better sit down.' Pt received on 2L O2, moved to room air at beginning  throughout evaluation, with noted saturation with all activity @ 94% or greater, whereas pt is not on O2 at baseline at home.   Patient presenting with impairment of strength, range of motion, balance, cognition, oxygen perfusion, and activity tolerance, limiting ability to perform ADL and mobility tasks at  baseline level of function. Patient will benefit from skilled intervention to address the above impairments and limitations, in order to restore to prior level of function, improve patient safety upon discharge, and to decrease falls risk.  The patient is currently demonstrating limited ability to participate with PT due to AMS, however as this is not suspected to be the patient's baseline level of function, PT will continue to attempt to work with the patient to ascertain appropriateness of PT services going forward. Updates will be made accordingly.        Follow Up Recommendations  (return to Memory unit at [place]. )    Equipment Recommendations  None recommended by PT    Recommendations for Other Services       Precautions / Restrictions Precautions Precautions: Fall Precaution Comments: no falls score available, dementia c AMS.  Restrictions Weight Bearing Restrictions: Yes RUE Weight Bearing: Weight bearing as tolerated RLE Weight Bearing: Weight bearing as tolerated      Mobility  Bed Mobility Overal bed mobility: Needs Assistance Bed Mobility: Supine to Sit     Supine to sit: Max assist     General bed mobility comments: Received up in chair.   Transfers Overall transfer level: Needs assistance Equipment used: 1 person hand held assist Transfers: Sit to/from Stand Sit to Stand: Max  assist Stand pivot transfers: Max assist       General transfer comment: Max VC for technique. Visual cues required for direction.   Ambulation/Gait             General Gait Details: does not want to walk, weight bear is limited due to poor tolerance,  facial expression reveals concern and surprise regarding R thigh pain with standing.   Stairs            Wheelchair Mobility    Modified Rankin (Stroke Patients Only)       Balance Overall balance assessment: History of Falls;Needs assistance         Standing balance support: During functional activity;Single extremity supported Standing balance-Leahy Scale: Poor                               Pertinent Vitals/Pain Pain Assessment: Faces Faces Pain Scale: Hurts even more Pain Location: R mid thigh, only noted with seated hip flexion and standing.  Pain Intervention(s): Limited activity within patient's tolerance;Monitored during session;Premedicated before session;Repositioned    Home Living Family/patient expects to be discharged to:: Skilled nursing facility                      Prior Function Level of Independence: Needs assistance   Gait / Transfers Assistance Needed: supervision   ADL's / Homemaking Assistance Needed: total assistance with ADL   Comments: gathered from grandson Corene Cornea) on telephone.      Hand Dominance   Dominant Hand: Right (Based on functional tasks during evaluation)    Extremity/Trunk Assessment   Upper Extremity Assessment: Overall WFL for tasks assessed           Lower Extremity Assessment: Generalized weakness;RLE deficits/detail;Difficult to assess due to impaired cognition (No active participation in PROM, does stand with tactile cues, but reluctant due to surgical pain. )         Communication   Communication: Expressive difficulties;Receptive difficulties (difficulty with tongue control, does not respond to verbal language >75% of time. )  Cognition Arousal/Alertness: Awake/alert Behavior During Therapy: Flat affect;Impulsive (perseverating on walking to visit sister. ) Overall Cognitive Status: Impaired/Different from baseline Area of Impairment: Following  commands;Safety/judgement;Awareness;Attention;Orientation;Memory Orientation Level: Disoriented to;Place;Time;Situation       Safety/Judgement: Decreased awareness of safety;Decreased awareness of deficits          General Comments      Exercises General Exercises - Lower Extremity Ankle Circles/Pumps: PROM;Right;15 reps (painfree) Long Arc Quad: PROM;Right;15 reps (pain free) Hip Flexion/Marching: PROM;Right;15 reps (painful. )      Assessment/Plan    PT Assessment Patient needs continued PT services (To be determined at next session. )  PT Diagnosis Difficulty walking;Generalized weakness;Abnormality of gait;Acute pain;Altered mental status   PT Problem List Decreased strength;Decreased range of motion;Decreased activity tolerance;Decreased cognition;Decreased mobility;Decreased safety awareness;Decreased knowledge of precautions;Pain  PT Treatment Interventions Gait training;Functional mobility training;Therapeutic exercise;Therapeutic activities   PT Goals (Current goals can be found in the Care Plan section) Acute Rehab PT Goals PT Goal Formulation: Patient unable to participate in goal setting    Frequency BID (subject to chage based on ability to participate. )   Barriers to discharge        Co-evaluation               End of Session Equipment Utilized During Treatment: Gait belt;Oxygen Activity Tolerance: Patient tolerated treatment well;Patient limited by pain Patient  left: in chair;with call bell/phone within reach;with SCD's reapplied;with chair alarm set Nurse Communication: Mobility status;Other (comment)         [id number] PT Time Calculation (min) (ACUTE ONLY): 19 min   Charges:   PT Evaluation $PT Eval Moderate Complexity: 1 Procedure PT Treatments $Therapeutic Activity: 8-22 mins   PT G Codes:        12:36 PM, 04-14-2016 Etta Grandchild, PT, DPT PRN Physical Therapist - [place] License # [id number] [phone] (mobile)

## 2016-03-21 NOTE — Evaluation (Signed)
Occupational Therapy Evaluation Patient Details Name: Paula [name] MRN: [id number] DOB: March 27, 1923 Today's Date: 03/21/2016    History of Present Illness Patient is a 82 y/o female S/P closed treatment internal fixation right hip completed on 03/20/16 after a mechanical fall at [facility]. Past medical history significant for dementia, hypothyroidism and HTN.    Clinical Impression   Upon arrival, patient presented with very garbled speech and was unable to communicate with therapist. Therapist did speak with nurse to see if this was baseline or new. Nurse was told that patient has a difficult time with her anesthetic yesterday after surgery. As therapist worked with patient her speech became more clear with her speech. Patient is overall confused due to significant dementia. Recommend patient discharge to SNF to receive skilled OT services to increase functional performance during ADL tasks and transfers.      Follow Up Recommendations  SNF    Equipment Recommendations  None recommended by OT       Precautions / Restrictions Precautions Precautions: Fall Precaution Comments: Dementia Restrictions Weight Bearing Restrictions: Yes RUE Weight Bearing: Weight bearing as tolerated      Mobility Bed Mobility Overal bed mobility: Needs Assistance Bed Mobility: Supine to Sit     Supine to sit: Max assist        Transfers Overall transfer level: Needs assistance Equipment used: None Transfers: Sit to/from [facility] Sit to Stand: Max assist Stand pivot transfers: Max assist       General transfer comment: Max VC for technique. Visual cues required for direction.          ADL Overall ADL's : Needs assistance/impaired Eating/Feeding: Supervision/ safety;Sitting                   Lower Body Dressing: Total assistance;Bed level   Toilet Transfer: Maximal assistance;Squat-pivot Toilet Transfer Details (indicate cue type and  reason): To recliner                           Pertinent Vitals/Pain Pain Assessment: Faces Faces Pain Scale: Hurts a little bit Pain Location: Right hip Pain Intervention(s): Monitored during session;Repositioned     Hand Dominance Right (Based on functional tasks during evaluation)   Extremity/Trunk Assessment Upper Extremity Assessment Upper Extremity Assessment: Overall WFL for tasks assessed   Lower Extremity Assessment Lower Extremity Assessment: Defer to PT evaluation       Communication Communication Communication: Expressive difficulties   Cognition Arousal/Alertness: Awake/alert Behavior During Therapy: Restless Overall Cognitive Status: Difficult to assess                                Home Living Family/patient expects to be discharged to:: Skilled nursing facility                                        Prior Functioning/Environment Level of Independence: Needs assistance        Comments: Patient unable to provide any background information due to significant dementia.    OT Diagnosis: Generalized weakness   OT Problem List: Decreased strength;Decreased safety awareness                       End of Session Equipment Utilized During Treatment: Gait belt Nurse Communication: Mobility status;Other (  comment) (Need for supervision during meals and IV care.)  Activity Tolerance: Patient tolerated treatment well Patient left: in chair;with chair alarm set   Time: [phone] OT Time Calculation (min): 30 min Charges:  OT General Charges $OT Visit: 1 Procedure OT Evaluation $OT Eval Low Complexity: 1 Procedure G-Codes:    Ailene Ravel, OTR/L,CBIS  [phone]  03/21/2016, 10:09 AM

## 2016-03-21 NOTE — Progress Notes (Signed)
This is postop day 1 patient had a right hip fracture treated with cannulated screws  Weightbearing status as tolerated  Begin physical therapy.  If she is stable she can be discharged to rehabilitation at any point   CBC Latest Ref Rng 03/21/2016 03/20/2016 03/20/2016  WBC 4.0 - 10.5 K/uL 14.3(H) 15.2(H) 15.8(H)  Hemoglobin 12.0 - 15.0 g/dL 12.1 12.8 12.7  Hematocrit 36.0 - 46.0 % 36.3 38.7 38.0  Platelets 150 - 400 K/uL 355 350 365

## 2016-03-21 NOTE — Care Management Note (Signed)
Case Management Note  Patient Details  Name: AVAH SMETHURST MRN: [id number] Date of Birth: 1923-09-28  Subjective/Objective:                  Pt admitted for hip fx s/p repair. Pt is from [facility]. PT has evaluated pt and recommends return to ALF with PT services. [facility] able to provide PT. CSW to relay PT need to facility. CSW will make arrangements for return to facility when appropriate.   Action/Plan: No CM needs anticipated.   Expected Discharge Date:       03/22/2016           Expected Discharge Plan:  Assisted Living / Rest Home (with PT services)  In-House Referral:  Clinical Social Work  Discharge planning Services  CM Consult  Post Acute Care Choice:  Home Health Choice offered to:  NA  DME Arranged:    DME Agency:     HH Arranged:  PT [place] Agency:  Other - See comment  Status of Service:  Completed, signed off  Medicare Important Message Given:  Yes Date Medicare IM Given:    Medicare IM give by:    Date Additional Medicare IM Given:    Additional Medicare Important Message give by:     If discussed at [place] of Stay Meetings, dates discussed:    Additional Comments:  Sherald Barge, RN 03/21/2016, 10:19 AM

## 2016-03-21 NOTE — Progress Notes (Signed)
PT Cancellation Note  Patient Details Name: Paula [name] MRN: [id number] DOB: [birth year]   Cancelled Treatment:    Reason Eval/Treat Not Completed: Other (comment). Holding BID treatment after morning session. Pt presenting with acute AMS since ORIF. Discussed with attending, will wait until tomorrow and reassess patient's ability to participate in PT, follow commands, and verbally interact.   8:14 PM, 03/21/2016 Etta Grandchild, PT, DPT PRN Physical Therapist - [place] License # [id number] [phone] (mobile)

## 2016-03-21 NOTE — Evaluation (Signed)
Clinical/Bedside Swallow Evaluation Patient Details  Name: Paula [name] MRN: [id number] Date of Birth: September 23, 1923  Today's Date: 03/21/2016 Time: SLP Start Time (ACUTE ONLY): [id number] SLP Stop Time (ACUTE ONLY): 1558 SLP Time Calculation (min) (ACUTE ONLY): 15 min  Past Medical History:  Past Medical History  Diagnosis Date  . Diabetes mellitus   . Hypertension   . Diagnosis unknown   . High cholesterol    Past Surgical History:  Past Surgical History  Procedure Laterality Date  . Eye surgery     HPI:  Paula [name] is a 82 y.o. female with a past medical history significant for dementia, hypothyroidism and HTN who presents with hip pain after a fall, resulted in a subcapital right hip fracture per CT of hip. CT of head negative for acute changes. Per epic notes pt a recent resident of [facility] in the memory care unit.    Assessment / Plan / Recommendation Clinical Impression  Pt presents with mild oropharyngeal dysphagia impacted by advanced dementia with associated cognitive decline. Note reduced labial seal with thin liquids via cup sip, oral bolus discoordination and multiple swallows during PO trials. Pt appeared to exhibit adequate airway protection without overt signs or symptoms of aspiration with any PO. Pt unable to follow simple commands consistently for full oral motor exam. Pt with prolonged mastication of solids secondary to generalized weakness of oral musculature and reduced speech intelligibility/reduced articulation of sounds, though not concerning given acuity of hip fracture requiring surgical repair and anesthia use. Recommend diet downgrade to dysphagia 3 (mechancial soft) and thin liquids with medicines whole. Full supervision and feeding assistance warranted to maximize nutrition and hydration and reduce aspiration risk. ST to follow up for diet tolerance     Aspiration Risk  Mild aspiration risk    Diet Recommendation     Medication Administration: Whole  meds with liquid    Other  Recommendations Oral Care Recommendations: Oral care BID   Follow up Recommendations  24 hour supervision/assistance    Frequency and Duration min 1 x/week  1 week       Prognosis Prognosis for Safe Diet Advancement: Good Barriers to Reach Goals: Cognitive deficits      Swallow Study   General Date of Onset: 03/19/16 HPI: Paula [name] is a 82 y.o. female with a past medical history significant for dementia, hypothyroidism and HTN who presents with hip pain after a fall, resulted in a subcapital right hip fracture per CT of hip. CT of head negative for acute changes. Per epic notes pt a recent resident of [facility] in the memory care unit.  Type of Study: Bedside Swallow Evaluation Previous Swallow Assessment: no prior hx of dysphagia  Diet Prior to this Study: Regular;Thin liquids Temperature Spikes Noted: Yes Respiratory Status: Room air History of Recent Intubation: No Behavior/Cognition: Confused;Alert Oral Cavity Assessment: Dry Oral Care Completed by SLP: Yes Oral Cavity - Dentition: Adequate natural dentition;Missing dentition Self-Feeding Abilities: Needs assist (secondary to distractibility from advanced cognitive decline) Patient Positioning: Upright in bed Baseline Vocal Quality: Low vocal intensity Volitional Cough: Cognitively unable to elicit Volitional Swallow: Unable to elicit    Oral/Motor/Sensory Function Overall Oral Motor/Sensory Function: Generalized oral weakness   Ice Chips Ice chips: Not tested   Thin Liquid Thin Liquid: Impaired Presentation: Cup;Straw Oral Phase Impairments: Reduced labial seal;Reduced lingual movement/coordination Pharyngeal  Phase Impairments: Suspected delayed Swallow;Multiple swallows    Nectar Thick Nectar Thick Liquid: Not tested   Honey Thick Honey Thick  Liquid: Not tested   Puree Puree: Within functional limits   Solid   GO   Solid: Impaired Oral Phase Impairments: Impaired  mastication Oral Phase Functional Implications: Prolonged oral transit       Arvil Chaco MA, [place] 03/21/2016,4:24 PM

## 2016-03-21 NOTE — Clinical Social Work Note (Signed)
Clinical Social Work Assessment  Patient Details  Name: TALENA MAZURE MRN: [id number] Date of Birth: 14-Sep-1923  Date of referral:  03/21/16               Reason for consult:  Other (Comment Required) (patient admitted from SCU: memory care [place])                Permission sought to share information with:  Case Manager, Customer service manager, Family Supports Permission granted to share information::  Yes, Verbal Permission Granted  Name::        Agency::  [facility]  Relationship::  Granddaughter: IT consultant Information:     Housing/Transportation Living arrangements for the past 2 months:  [place] of Information:  Medical Team, Facility, Adult Children Patient Interpreter Needed:  None Criminal Activity/Legal Involvement Pertinent to Current Situation/Hospitalization:  No - Comment as needed Significant Relationships:  Adult Children, Community Support Lives with:  Facility Resident Do you feel safe going back to the place where you live?  Yes Need for family participation in patient care:  Yes (Comment) (memory problems)  Care giving concerns:  LCSW spoke with facility as well as family with no concerns. Both parties updated and patient will need HH at facility once medically stable.  Family agreeable to speech therapy involved as well. Agreeable for return at DC to [facility]   Social Worker assessment / plan:  LCSW made aware patient is from memory care unit. Agreeable for return from family and facility. Facility will arrange [place] at facility and LCSW to update FL2 and add HH along with new diet as needed. No other needs voiced at this time. LCSW continues to follow for support and disposition.  Employment status:  Retired Nurse, adult PT Recommendations:  Home with [place] / Referral to community resources:  Other (Comment Required) (none needed or requested at this  time.)  Patient/Family's Response to care:  Agreeable  Patient/Family's Understanding of and Emotional Response to Diagnosis, Current Treatment, and Prognosis:  Granddaughter is very appreciative of call and update. Reports understanding and in agreement with patient's plan for speech involved as well as hh at facilty. Aware of medical needs and has no questions. Hopeful for return to [facility] the first of the week.  Emotional Assessment Appearance:  Appears stated age Attitude/Demeanor/Rapport:  Other (cooperative and confused) Affect (typically observed):  Accepting, Anxious Orientation:  Oriented to Self Alcohol / Substance use:  Not Applicable Psych involvement (Current and /or in the community):  No (Comment)  Discharge Needs  Concerns to be addressed:  No discharge needs identified Readmission within the last 30 days:  No Current discharge risk:  None Barriers to Discharge:  No Barriers Identified   [place]illy [place], LCSW 03/21/2016, 2:17 PM

## 2016-03-21 NOTE — Progress Notes (Signed)
Initial Nutrition Assessment  INTERVENTION:   Ensure Enlive po BID, each supplement provides 350 kcal and 20 grams of protein   Recommend liberalize diet as much as feasible to maximize oral intake  NUTRITION DIAGNOSIS:  Increased protein and energy needs related to right hip fx-s/p surgical repair AEB est needs and PMH   GOAL:  Pt to meet >/= 90% of their estimated nutrition needs      MONITOR:  Po intake, labs and wt trends, supplement acceptance    REASON FOR ASSESSMENT:   Low [place]    ASSESSMENT:   Paula [name] is a 82 y.o. female with a past medical history significant for dementia, hypothyroidism and HTN who presents with hip pain after a fall. She and her husband of 60 years moved to [place] about 4 weeks ago per grandaughter.   Pt has been eating a regular diet. She likes Ham biscuit for breakfast and chicken tenders, Meat loaf, mashed potatoes etc for lunch and dinner. She is accustomed to eating 3 meals daily and has been maintaining her weight for > 1 year.  She had been drinking Ensure a couple of years ago when pt experienced some unplanned weight loss but after maintaining stable weight for some time pt oral nutrition supplement was discontinued. Recommend she resume now that her needs have increased in order to prevent loss of lean body mass and meet increased metabolic demand.  Pt is usually able to feed herself but granddaughter is concerned that she may need some assistance while she is here to make the most of her nutrition intake opportunities.   Nutrition focused exam: mild depletions of muscle expected with a person of her age. No lower extremity edema.  Abnormal labs:  CBG's 118, 129. WBC's 14.3   Diet Order:  Diet Carb Modified Fluid consistency:: Thin; Room service appropriate?: Yes  Skin:   surgical incision to right hip  Last BM:   4/7   Height:   Ht Readings from Last 1 Encounters:  03/20/16 5\' 2"  (1.575 m)    Weight:   Wt Readings  from Last 1 Encounters:  03/20/16 106 lb 11.2 oz (48.399 kg)    Ideal Body Weight:  50 kg  BMI:  Body mass index is 19.51 kg/(m^2).  Estimated Nutritional Needs:   Kcal:  1400-1500   Protein:  57-65 gr  Fluid:  1.4-1.5 liters  EDUCATION NEEDS: none indicated today    Colman Cater MS,RD,CSG,LDN Office: [phone] Pager: [phone]

## 2016-03-21 NOTE — Care Management Important Message (Signed)
Important Message  Patient Details  Name: Paula [name] MRN: [id number] Date of Birth: April 05, 1923   Medicare Important Message Given:  Yes    Sherald Barge, RN 03/21/2016, 7:55 AM

## 2016-03-21 NOTE — Addendum Note (Signed)
Addendum  created 03/21/16 1119 by Ollen Bowl, CRNA   Modules edited: Clinical Notes   Clinical Notes:  File: [id number]

## 2016-03-21 NOTE — Plan of Care (Signed)
Problem: Acute Rehab PT Goals(only PT should resolve) Goal: Patient Will Transfer Sit To/From Stand Pt will transfer sit to/from-stand with HHA c min assist and tactile cues without loss-of-balance to demonstrate good ability to participate with PT p DC.      Goal: Pt Will Ambulate Pt will ambulate with HHA at [facility] for a distance greater than 257ft to demonstrate the ability to perform safe household distance ambulation at discharge.    Goal: PT Additional Goal #1 Pt will demonstrate the ability to participate in home exercises with supervision,verbal cues, and tactile cues to improve strength.

## 2016-03-21 NOTE — Clinical Documentation Improvement (Addendum)
Internal Medicine  Can the diagnosis of CHF be further specified by type and acuity if known ?  Thank you    Acuity - Acute, Chronic, Acute on Chronic   Type - Systolic, Diastolic, Systolic and Diastolic  Other  Clinically Undetermined      Supporting Information: "Cardiac: Patient has a RCRI score of 0 (active cardiac condition, CHF, CAD, DM treated with insulin, TIA/CVA, Cr > 2.0). "  No prior hx of CHF. That is why the RCRI score is 0; between parenthesis are just as mentioned whet th RCRI score check for)...   Thanks    Please exercise your independent, professional judgment when responding. A specific answer is not anticipated or expected.   Thank You,  [place] [phone]

## 2016-03-22 DIAGNOSIS — E119 Type 2 diabetes mellitus without complications: Secondary | ICD-10-CM

## 2016-03-22 DIAGNOSIS — I10 Essential (primary) hypertension: Secondary | ICD-10-CM

## 2016-03-22 DIAGNOSIS — S72011D Unspecified intracapsular fracture of right femur, subsequent encounter for closed fracture with routine healing: Secondary | ICD-10-CM

## 2016-03-22 DIAGNOSIS — N183 Chronic kidney disease, stage 3 (moderate): Secondary | ICD-10-CM

## 2016-03-22 LAB — CBC
HEMATOCRIT: 35.1 % — AB (ref 36.0–46.0)
Hemoglobin: 11.6 g/dL — ABNORMAL LOW (ref 12.0–15.0)
MCH: 30.1 pg (ref 26.0–34.0)
MCHC: 33 g/dL (ref 30.0–36.0)
MCV: 90.9 fL (ref 78.0–100.0)
Platelets: 328 10*3/uL (ref 150–400)
RBC: 3.86 MIL/uL — ABNORMAL LOW (ref 3.87–5.11)
RDW: 13.6 % (ref 11.5–15.5)
WBC: 11.6 10*3/uL — ABNORMAL HIGH (ref 4.0–10.5)

## 2016-03-22 LAB — BASIC METABOLIC PANEL
Anion gap: 8 (ref 5–15)
BUN: 18 mg/dL (ref 6–20)
CALCIUM: 8.2 mg/dL — AB (ref 8.9–10.3)
CHLORIDE: 106 mmol/L (ref 101–111)
CO2: 25 mmol/L (ref 22–32)
Creatinine, Ser: 0.82 mg/dL (ref 0.44–1.00)
GFR calc Af Amer: >60 mL/min (ref >60)
GFR calc non Af Amer: >60 mL/min (ref >60)
GLUCOSE: 98 mg/dL (ref 65–99)
Potassium: 3.5 mmol/L (ref 3.5–5.1)
Sodium: 139 mmol/L (ref 135–145)

## 2016-03-22 LAB — GLUCOSE, CAPILLARY: Glucose-Capillary: 99 mg/dL (ref 65–99)

## 2016-03-22 NOTE — Progress Notes (Signed)
[facility] PROGRESS NOTE  Paula [name] [id number] DOB: 1923/01/18 DOA: 03/19/2016 PCP: Wende Neighbors, MD  Assessment/Plan: 1. Hip Fx. S/p ORIF on 4/6. Currently on ASA for DVT prophylaxis. Start weight bearing as tolerated. Follow post operative recs from ortho service. PT recommends SNF on discharge. 2. UTI. Pyuria and bacteriuria on UA. Afebrile. WBC elevated and worsening confusion, though WBC improving today. Urine Cx showed multiple species and recollection is recommended. Continue abx. 3. HTN. Continue current meds and monitor. 4. NIDDM. Currently diet controlled. Check CBG daily. 5. CKD stage 3. At baseline. Cr wnl. Avoid nephrotoxins. Continue to monitor. 6. Hypothyroidism. Continue synthroid. 7. Dysphagia. Most likely from intubation, anesthesia, and pain meds. SPL evaluated, input appreciated. Continue supportive care. 8. HLD. Continue statins. 9. Dementia.   Code Status: DNR DVT prophylaxis: SCDs Family Communication: Discussed with patient  Disposition Plan: Discharge once improved. Will need SNF placement.   Consultants:  Ortho  Procedures:  Right hip ORIF on 4/6  Antibiotics:  Rocephin 4/7 >>  HPI/Subjective: Difficult to obtain due to patients poor enunciation.   Objective: Filed Vitals:   03/21/16 2104 03/22/16 0603  BP: 141/70 161/85  Pulse: 92 87  Temp: 97.8 F (36.6 C) 97.5 F (36.4 C)  Resp: 20 20    Intake/Output Summary (Last 24 hours) at 03/22/16 0828 Last data filed at 03/21/16 1800  Gross per 24 hour  Intake 1742.5 ml  Output    850 ml  Net  892.5 ml   Filed Weights   03/20/16 0514  Weight: 48.399 kg (106 lb 11.2 oz)    Exam:  General: NAD, looks comfortable Cardiovascular: RRR, S1, S2  Respiratory: clear bilaterally, No wheezing, rales or rhonchi Abdomen: soft, non tender, no distention , bowel sounds normal Musculoskeletal: No edema b/l   Data Reviewed: Basic Metabolic Panel:  Recent Labs Lab 03/20/16 0030  03/20/16 0534 03/21/16 0551 03/22/16 0604  NA 139 139 139 139  K 4.3 4.3 3.8 3.5  CL 104 105 105 106  CO2 26 22 23 25   GLUCOSE 139* 167* 123* 98  BUN 37* 34* 18 18  CREATININE 1.10* 1.03* 0.90 0.82  CALCIUM 8.8* 8.9 8.6* 8.2*   Liver Function Tests:  Recent Labs Lab 03/20/16 0030  AST 45*  ALT 22  ALKPHOS 76  BILITOT 1.0  PROT 6.5  ALBUMIN 3.7   No results for input(s): LIPASE, AMYLASE in the last 168 hours. No results for input(s): AMMONIA in the last 168 hours. CBC:  Recent Labs Lab 03/20/16 0030 03/20/16 0534 03/21/16 0551 03/22/16 0604  WBC 15.8* 15.2* 14.3* 11.6*  NEUTROABS 13.3*  --   --   --   HGB 12.7 12.8 12.1 11.6*  HCT 38.0 38.7 36.3 35.1*  MCV 91.8 92.1 91.2 90.9  PLT 365 350 355 328   Cardiac Enzymes: No results for input(s): CKTOTAL, CKMB, CKMBINDEX, TROPONINI in the last 168 hours. BNP (last 3 results) No results for input(s): BNP in the last 8760 hours.  ProBNP (last 3 results) No results for input(s): PROBNP in the last 8760 hours.  CBG:  Recent Labs Lab 03/20/16 0811 03/20/16 1040 03/20/16 1326 03/22/16 0519  GLUCAP 136* 118* 129* 99    Recent Results (from the past 240 hour(s))  Urine culture     Status: None   Collection Time: 03/20/16  1:20 AM  Result Value Ref Range Status   Specimen Description URINE, CATHETERIZED  Final   Special Requests NONE  Final   Culture MULTIPLE  SPECIES PRESENT, SUGGEST RECOLLECTION  Final   Report Status 03/21/2016 FINAL  Final  Surgical pcr screen     Status: None   Collection Time: 03/20/16  8:30 AM  Result Value Ref Range Status   MRSA, PCR NEGATIVE NEGATIVE Final   Staphylococcus aureus NEGATIVE NEGATIVE Final    Comment:        The Xpert SA Assay (FDA approved for NASAL specimens in patients over 10 years of age), is one component of a comprehensive surveillance program.  Test performance has been validated by [facility] for patients greater than or equal to 41 year old. It is not  intended to diagnose infection nor to guide or monitor treatment.      Studies: Dg Hip Operative Unilat With Pelvis Right  03/20/2016  CLINICAL DATA:  ORIF femoral neck fracture EXAM: OPERATIVE right HIP (WITH PELVIS IF PERFORMED)  VIEWS TECHNIQUE: Fluoroscopic spot image(s) were submitted for interpretation post-operatively. COMPARISON:  CT same day FINDINGS: Multiple C-arm images show placement of 3 Knowles type pins for treatment of a femoral neck fracture. Pins appear well positioned and there is no radiographically detectable complication. IMPRESSION: Placement of 3 cannulated pins for treatment of a femoral neck fracture. Electronically Signed   By: Nelson Chimes M.D.   On: 03/20/2016 13:17    Scheduled Meds: . amLODipine  5 mg Oral Daily  . aspirin EC  325 mg Oral Q breakfast  . cefTRIAXone (ROCEPHIN)  IV  1 g Intravenous Q24H  . docusate sodium  100 mg Oral BID  . donepezil  10 mg Oral QHS  . feeding supplement (ENSURE ENLIVE)  237 mL Oral BID BM  . levothyroxine  25 mcg Oral QAC breakfast  . metoprolol succinate  50 mg Oral Daily  . polyethylene glycol  17 g Oral Daily  . simvastatin  40 mg Oral Daily  . traMADol  50 mg Oral Once   Continuous Infusions: . sodium chloride 75 mL/hr at 03/21/16 1415    Principal Problem:   Subcapital fracture of right hip (HCC) Active Problems:   Essential hypertension   Controlled type 2 diabetes mellitus without complication, without long-term current use of insulin (HCC)   Hypothyroidism, acquired   CKD (chronic kidney disease) stage 3, GFR 30-59 ml/min   UTI (lower urinary tract infection)   Subcapital fracture of hip (HCC)   Hip fracture ([place])    Time spent: 25 minutes    Kathie Dike, MD. [facility] Pager [phone]. If 7PM-7AM, please contact night-coverage at [email or web address], password [facility] 03/22/2016, 8:28 AM  LOS: 2 days     By signing my name below, I, Delene Ruffini, attest that this documentation has been  prepared under the direction and in the presence of Kathie Dike, MD. Electronically Signed: Delene Ruffini 03/22/2016 12:35pm  I, Dr. Kathie Dike, personally performed the services described in this documentaiton. All medical record entries made by the scribe were at my direction and in my presence. I have reviewed the chart and agree that the record reflects my personal performance and is accurate and complete  Kathie Dike, MD, 03/22/2016 12:41 PM

## 2016-03-22 NOTE — Progress Notes (Signed)
Subjective: Can not assess   Objective: Vital signs in last 24 hours: Temp:  [97.5 F (36.4 C)-97.9 F (36.6 C)] 97.5 F (36.4 C) (04/08 0603) Pulse Rate:  [87-106] 87 (04/08 0603) Resp:  [18-20] 20 (04/08 0603) BP: (141-179)/(70-85) 161/85 mmHg (04/08 0603) SpO2:  [96 %-99 %] 99 % (04/08 0603)  Intake/Output from previous day: 04/07 0701 - 04/08 0700 In: 1742.5 [P.O.:600; I.V.:1042.5; IV Piggyback:100] Out: 850 [Urine:850] Intake/Output this shift:     Recent Labs  03/20/16 0030 03/20/16 0534 03/21/16 0551 03/22/16 0604  HGB 12.7 12.8 12.1 11.6*    Recent Labs  03/21/16 0551 03/22/16 0604  WBC 14.3* 11.6*  RBC 3.98 3.86*  HCT 36.3 35.1*  PLT 355 328    Recent Labs  03/21/16 0551 03/22/16 0604  NA 139 139  K 3.8 3.5  CL 105 106  CO2 23 25  BUN 18 18  CREATININE 0.90 0.82  GLUCOSE 123* 98  CALCIUM 8.6* 8.2*    Recent Labs  03/20/16 0030  INR 1.06    Neurologically intact Sensation intact distally Intact pulses distally Dorsiflexion/Plantar flexion intact  Assessment/Plan: Stable    Arther Abbott 03/22/2016, 8:28 AM

## 2016-03-22 NOTE — Progress Notes (Signed)
Pt has not voided since approximately 1100.  Bladder scan revealed 90 ml's in bladder.  VSS.  Mental status at baseline for patient's history.  Dr. Roderic Palau made aware.  No new orders at this time.  Will continue to monitor patient.

## 2016-03-22 NOTE — Progress Notes (Signed)
Physical Therapy Treatment Patient Details Name: Paula [name] MRN: [id number] DOB: [birth year] Today's Date: 03/22/2016    History of Present Illness Paula [name] is a 82yo white female who sustained a fall recently at a factility, immediately unable to ambulate. The patient has advanced demetia, previously at home with assistance, and at [facility] unit now for 1 month. PLOF in mobility was without deficit, however family reports that pt was getting a sleep aid  QD wherein it was supposed to be PRN, and pt appears more 'zombie like' as of recent. Pt sustains a fracture, underwent ORIF of R hip on 4/6. [place] over th ephone reports exellent management of stairs/community distances without AD 1MA.  PMH: dementia, HTN, DM (diet controlled), CKD3, UTI.     PT Comments    Pt is lethargic.  Pt will not respond to command.  Exercises attempted but no assist with pt PROM only.  Pt frequency of therapy will be decreased to 5x a week qd only unless pt begins to follow commands.    Follow Up Recommendations  SNF     Equipment Recommendations  None recommended by PT    Recommendations for Other Services  none     Precautions / Restrictions Precautions Precautions: Fall Restrictions Weight Bearing Restrictions: Yes RLE Weight Bearing: Weight bearing as tolerated    Mobility  Bed Mobility Overal bed mobility: Needs Assistance Bed Mobility: Supine to Sit     Supine to sit: Max assist        Transfers Overall transfer level: Needs assistance Equipment used: 1 person hand held assist   Sit to Stand: Max assist Stand pivot transfers: Max assist                           Cognition Arousal/Alertness: Lethargic Behavior During Therapy: Flat affect Overall Cognitive Status: History of cognitive impairments - at baseline Area of Impairment: Following commands;Safety/judgement;Awareness;Attention;Orientation;Memory Orientation Level: Disoriented  to;Place;Time;Situation       Safety/Judgement: Decreased awareness of safety;Decreased awareness of deficits          Exercises  PROM for ankle; and extension of knee when sitting.         Pertinent Vitals/Pain Faces Pain Scale: Hurts little more       Prior Function     Pt in memory unit at assistive care center.        PT Goals (current goals can now be found in the care plan section) Acute Rehab PT Goals PT Goal Formulation: Patient unable to participate in goal setting Progress towards PT goals: Not progressing toward goals - comment (not following commands )    Frequency  Min 5X/week (due to only fair rehab potential )    PT Plan Frequency needs to be updated       End of Session Equipment Utilized During Treatment: Gait belt Activity Tolerance: Other (comment) (dementia will not follow commands) Patient left: in chair;with chair alarm set;with call bell/phone within reach     Time: [id number] PT Time Calculation (min) (ACUTE ONLY): 16 min  Charges:  $Therapeutic Activity: 8-22 mins                    G CodesRayetta [name], PT CLT [phone] 03/22/2016, 11:13 AM

## 2016-03-23 LAB — CBC
HCT: 30.8 % — ABNORMAL LOW (ref 36.0–46.0)
Hemoglobin: 9.9 g/dL — ABNORMAL LOW (ref 12.0–15.0)
MCH: 30 pg (ref 26.0–34.0)
MCHC: 32.1 g/dL (ref 30.0–36.0)
MCV: 93.3 fL (ref 78.0–100.0)
PLATELETS: 286 10*3/uL (ref 150–400)
RBC: 3.3 MIL/uL — AB (ref 3.87–5.11)
RDW: 13.4 % (ref 11.5–15.5)
WBC: 9 10*3/uL (ref 4.0–10.5)

## 2016-03-23 LAB — GLUCOSE, CAPILLARY: Glucose-Capillary: 83 mg/dL (ref 65–99)

## 2016-03-23 NOTE — Progress Notes (Signed)
[facility] PROGRESS NOTE  ROSSELLA SOUTH [id number] DOB: 13-May-1923 DOA: 03/19/2016 PCP: Wende Neighbors, MD  Assessment/Plan: 1. Hip Fx. S/p ORIF on 4/6. Currently on ASA for DVT prophylaxis. Continue weight bearing as tolerated. Follow post operative recs from ortho service. PT recommends SNF on discharge. 2. UTI. Pyuria and bacteriuria on UA. Afebrile. She appears confused and WBC was elevated on admission, though is now wnl.  Urine Cx showed multiple species and recollection is recommended. Continue abx. 3. HTN. Continue current meds and monitor. 4. NIDDM. Currently diet controlled. Check CBG daily. 5. CKD stage 3. At baseline. Cr wnl. Avoid nephrotoxins. Continue to monitor. 6. Hypothyroidism. Continue synthroid. 7. Dysphagia. Most likely from intubation, anesthesia, and pain meds. SPL evaluated, input appreciated. Continue supportive care. 8. HLD. Continue statins. 9. Dementia.   Code Status: DNR DVT prophylaxis: SCDs Family Communication: Discussed with patient  Disposition Plan: Discharge once improved. Will need SNF placement.   Consultants:  Ortho  PT- SNF  Procedures:  Right hip ORIF on 4/6  Antibiotics:  Rocephin 4/7 >>  HPI/Subjective: Feeling okay. Hx is limited due to baseline dementia.   Objective: Filed Vitals:   03/22/16 2203 03/23/16 0648  BP: 128/50 158/80  Pulse: 84 95  Temp: 98.3 F (36.8 C) 97.7 F (36.5 C)  Resp: 20 20    Intake/Output Summary (Last 24 hours) at 03/23/16 0805 Last data filed at 03/22/16 1801  Gross per 24 hour  Intake 871.25 ml  Output      0 ml  Net 871.25 ml   Filed Weights   03/20/16 0514  Weight: 48.399 kg (106 lb 11.2 oz)    Exam:  General: NAD, looks comfortable Cardiovascular: RRR, S1, S2  Respiratory: clear bilaterally, No wheezing, rales or rhonchi Abdomen: soft, non tender, no distention , bowel sounds normal Musculoskeletal: No edema b/l    Data Reviewed: Basic Metabolic Panel:  Recent  Labs Lab 03/20/16 0030 03/20/16 0534 03/21/16 0551 03/22/16 0604  NA 139 139 139 139  K 4.3 4.3 3.8 3.5  CL 104 105 105 106  CO2 26 22 23 25   GLUCOSE 139* 167* 123* 98  BUN 37* 34* 18 18  CREATININE 1.10* 1.03* 0.90 0.82  CALCIUM 8.8* 8.9 8.6* 8.2*   Liver Function Tests:  Recent Labs Lab 03/20/16 0030  AST 45*  ALT 22  ALKPHOS 76  BILITOT 1.0  PROT 6.5  ALBUMIN 3.7    CBC:  Recent Labs Lab 03/20/16 0030 03/20/16 0534 03/21/16 0551 03/22/16 0604 03/23/16 0546  WBC 15.8* 15.2* 14.3* 11.6* 9.0  NEUTROABS 13.3*  --   --   --   --   HGB 12.7 12.8 12.1 11.6* 9.9*  HCT 38.0 38.7 36.3 35.1* 30.8*  MCV 91.8 92.1 91.2 90.9 93.3  PLT 365 350 355 328 286    CBG:  Recent Labs Lab 03/20/16 0811 03/20/16 1040 03/20/16 1326 03/22/16 0519 03/23/16 0455  GLUCAP 136* 118* 129* 99 83    Recent Results (from the past 240 hour(s))  Urine culture     Status: None   Collection Time: 03/20/16  1:20 AM  Result Value Ref Range Status   Specimen Description URINE, CATHETERIZED  Final   Special Requests NONE  Final   Culture MULTIPLE SPECIES PRESENT, SUGGEST RECOLLECTION  Final   Report Status 03/21/2016 FINAL  Final  Surgical pcr screen     Status: None   Collection Time: 03/20/16  8:30 AM  Result Value Ref Range Status  MRSA, PCR NEGATIVE NEGATIVE Final   Staphylococcus aureus NEGATIVE NEGATIVE Final    Comment:        The Xpert SA Assay (FDA approved for NASAL specimens in patients over 77 years of age), is one component of a comprehensive surveillance program.  Test performance has been validated by [facility] for patients greater than or equal to 74 year old. It is not intended to diagnose infection nor to guide or monitor treatment.       Scheduled Meds: . amLODipine  5 mg Oral Daily  . aspirin EC  325 mg Oral Q breakfast  . cefTRIAXone (ROCEPHIN)  IV  1 g Intravenous Q24H  . docusate sodium  100 mg Oral BID  . donepezil  10 mg Oral QHS  . feeding  supplement (ENSURE ENLIVE)  237 mL Oral BID BM  . levothyroxine  25 mcg Oral QAC breakfast  . metoprolol succinate  50 mg Oral Daily  . polyethylene glycol  17 g Oral Daily  . simvastatin  40 mg Oral Daily   Continuous Infusions: . sodium chloride 75 mL/hr at 03/22/16 1754    Principal Problem:   Subcapital fracture of right hip (HCC) Active Problems:   Essential hypertension   Controlled type 2 diabetes mellitus without complication, without long-term current use of insulin (HCC)   Hypothyroidism, acquired   CKD (chronic kidney disease) stage 3, GFR 30-59 ml/min   UTI (lower urinary tract infection)   Subcapital fracture of hip (HCC)   Hip fracture ([place])    Time spent: 25 minutes    Kathie Dike, MD. [facility] Pager [phone]. If 7PM-7AM, please contact night-coverage at [email or web address], password [facility] 03/23/2016, 8:05 AM  LOS: 3 days     By signing my name below, I, Rosalie Doctor, attest that this documentation has been prepared under the direction and in the presence of [facility]. MD Electronically Signed: Rosalie Doctor, Scribe. 03/23/2016  10:13am  I, Dr. Kathie Dike, personally performed the services described in this documentaiton. All medical record entries made by the scribe were at my direction and in my presence. I have reviewed the chart and agree that the record reflects my personal performance and is accurate and complete  Kathie Dike, MD, 03/23/2016 10:25 AM

## 2016-03-24 LAB — TYPE AND SCREEN
ABO/RH(D): O POS
Antibody Screen: NEGATIVE
UNIT DIVISION: 0
Unit division: 0

## 2016-03-24 LAB — GLUCOSE, CAPILLARY: GLUCOSE-CAPILLARY: 94 mg/dL (ref 65–99)

## 2016-03-24 MED ORDER — POLYETHYLENE GLYCOL 3350 17 G PO PACK: 17.0000 g | PACK | Freq: Every day | ORAL | Status: AC

## 2016-03-24 MED ORDER — SENNOSIDES-DOCUSATE SODIUM 8.6-50 MG PO TABS: 1.0000 | ORAL_TABLET | Freq: Every evening | ORAL | Status: DC | PRN

## 2016-03-24 MED ORDER — TRAMADOL HCL 50 MG PO TABS: 50.0000 mg | ORAL_TABLET | Freq: Four times a day (QID) | ORAL | Status: DC | PRN

## 2016-03-24 MED ORDER — ASPIRIN 325 MG PO TBEC: 325.0000 mg | DELAYED_RELEASE_TABLET | Freq: Every day | ORAL | Status: DC

## 2016-03-24 NOTE — Progress Notes (Signed)
Pt's IV catheter removed and intact. Pt's IV site clean dry and intact. Pt escorted to [place] via [place] transport and [place]. Informed by Education officer, museum that all information was sent electronically. Pt in stable condition and in no acute distress at time of discharge.

## 2016-03-24 NOTE — NC FL2 (Deleted)
[place] LEVEL OF CARE SCREENING TOOL     IDENTIFICATION  Patient Name: Paula [name] Birthdate: 12/01/1923 Sex: female Admission Date (Current Location): 03/19/2016  [facility] and [place] Number:  [employer] and Address:  [place] [address], [place]      Provider Number: [id number]  Attending Physician Name and Address:  Kathie Dike, MD  Relative Name and Phone Number:       Current Level of Care: Hospital Recommended Level of Care: Other (Comment) (Secured Unit (memory care)) Prior Approval Number:    Date Approved/Denied:   PASRR Number:    Discharge Plan: Other (Comment) (return to [facility])    Current Diagnoses: Patient Active Problem List   Diagnosis Date Noted  . Essential hypertension 03/20/2016  . Controlled type 2 diabetes mellitus without complication, without long-term current use of insulin ([place]) 03/20/2016  . Hypothyroidism, acquired 03/20/2016  . Subcapital fracture of right hip ([place]) 03/20/2016  . CKD (chronic kidney disease) stage 3, GFR 30-59 ml/min 03/20/2016  . UTI (lower urinary tract infection) 03/20/2016  . Subcapital fracture of hip ([place]) 03/20/2016  . Hip fracture ([place]) 03/20/2016  . Uterovaginal prolapse, complete 06/06/2013    Orientation RESPIRATION BLADDER Height & Weight     Self  O2 (3L) Incontinent Weight: 106 lb 11.2 oz (48.399 kg) Height:  5\' 2"  (157.5 cm)  BEHAVIORAL SYMPTOMS/MOOD NEUROLOGICAL BOWEL NUTRITION STATUS  Wanderer   Incontinent Diet (pending speech)  AMBULATORY STATUS COMMUNICATION OF NEEDS Skin   Limited Assist Verbally Surgical wounds                       Personal Care Assistance Level of Assistance  Bathing, Feeding, Dressing Bathing Assistance: Limited assistance Feeding assistance: Limited assistance Dressing Assistance: Limited assistance     Functional Limitations Info  Hearing, Speech, Sight Sight Info: Adequate Hearing Info:  Adequate Speech Info: Adequate    SPECIAL CARE FACTORS FREQUENCY  PT (By licensed PT), Speech therapy (home health at facility)     PT Frequency: 3       Speech Therapy Frequency: pending      Contractures Contractures Info: Not present    Additional Factors Info  Code Status, Allergies, Psychotropic, Insulin Sliding Scale, Isolation Precautions Code Status Info: DNR Allergies Info: NKA Psychotropic Info: none Insulin Sliding Scale Info: none Isolation Precautions Info: none     Current Medications (03/24/2016):  This is the current hospital active medication list Current Facility-Administered Medications  Medication Dose Route Frequency Provider Last Rate Last Dose  . 0.9 %  sodium chloride infusion   Intravenous Continuous Edwin Dada, MD 75 mL/hr at 03/23/16 2140    . acetaminophen (TYLENOL) tablet 650 mg  650 mg Oral Q6H PRN Carole Civil, MD       Or  . acetaminophen (TYLENOL) suppository 650 mg  650 mg Rectal Q6H PRN Carole Civil, MD      . amLODipine (NORVASC) tablet 5 mg  5 mg Oral Daily Edwin Dada, MD   5 mg at 03/24/16 0849  . aspirin EC tablet 325 mg  325 mg Oral Q breakfast Carole Civil, MD   325 mg at 03/24/16 0848  . bisacodyl (DULCOLAX) suppository 10 mg  10 mg Rectal Daily PRN Carole Civil, MD      . cefTRIAXone (ROCEPHIN) 1 g in dextrose 5 % 50 mL IVPB  1 g Intravenous Q24H Edwin Dada,  MD   1 g at 03/24/16 0256  . docusate sodium (COLACE) capsule 100 mg  100 mg Oral BID Carole Civil, MD   100 mg at 03/24/16 0848  . donepezil (ARICEPT) tablet 10 mg  10 mg Oral QHS Edwin Dada, MD   10 mg at 03/23/16 2141  . feeding supplement (ENSURE ENLIVE) (ENSURE ENLIVE) liquid 237 mL  237 mL Oral BID BM Barton Dubois, MD   237 mL at 03/24/16 0847  . levothyroxine (SYNTHROID, LEVOTHROID) tablet 25 mcg  25 mcg Oral QAC breakfast Edwin Dada, MD   25 mcg at 03/24/16 0848  . menthol-cetylpyridinium  (CEPACOL) lozenge 3 mg  1 lozenge Oral PRN Carole Civil, MD       Or  . phenol (CHLORASEPTIC) mouth spray 1 spray  1 spray Mouth/Throat PRN Carole Civil, MD      . metoCLOPramide (REGLAN) tablet 5-10 mg  5-10 mg Oral Q8H PRN Carole Civil, MD       Or  . metoCLOPramide (REGLAN) injection 5-10 mg  5-10 mg Intravenous Q8H PRN Carole Civil, MD      . metoprolol succinate (TOPROL-XL) 24 hr tablet 50 mg  50 mg Oral Daily Edwin Dada, MD   50 mg at 03/24/16 0849  . morphine 2 MG/ML injection 1 mg  1 mg Intravenous Q4H PRN Barton Dubois, MD      . ondansetron [facility]) tablet 4 mg  4 mg Oral Q6H PRN Carole Civil, MD       Or  . ondansetron [facility]) injection 4 mg  4 mg Intravenous Q6H PRN Carole Civil, MD      . polyethylene glycol (MIRALAX / GLYCOLAX) packet 17 g  17 g Oral Daily Carole Civil, MD   17 g at 03/24/16 0847  . senna-docusate (Senokot-S) tablet 1 tablet  1 tablet Oral QHS PRN Edwin Dada, MD      . simvastatin (ZOCOR) tablet 40 mg  40 mg Oral Daily Edwin Dada, MD   40 mg at 03/24/16 0848  . traMADol (ULTRAM) tablet 50 mg  50 mg Oral Q6H PRN Carole Civil, MD   50 mg at 03/23/16 2141     Discharge Medications: Please see discharge summary for a list of discharge medications.  Relevant Imaging Results:  Relevant Lab Results:   Additional Information Needs to start Home Health:  PT  Armanie Ullmer, Clydene Pugh, LCSW

## 2016-03-24 NOTE — NC FL2 (Signed)
[place] LEVEL OF CARE SCREENING TOOL     IDENTIFICATION  Patient Name: Paula [name] Birthdate: Jul 26, 1923 Sex: female Admission Date (Current Location): 03/19/2016  [facility] and [place] Number:  [employer] and Address:  [place] [address] [place]      Provider Number: [id number]  Attending Physician Name and Address:  Kathie Dike, MD  Relative Name and Phone Number:       Current Level of Care: Hospital Recommended Level of Care: Other (Comment) (Secured Unit (memory care)) Prior Approval Number:    Date Approved/Denied:   PASRR Number:    Discharge Plan: Other (Comment) (return to [facility])    Current Diagnoses: Patient Active Problem List   Diagnosis Date Noted  . Essential hypertension 03/20/2016  . Controlled type 2 diabetes mellitus without complication, without long-term current use of insulin ([place]) 03/20/2016  . Hypothyroidism, acquired 03/20/2016  . Subcapital fracture of right hip ([place]) 03/20/2016  . CKD (chronic kidney disease) stage 3, GFR 30-59 ml/min 03/20/2016  . UTI (lower urinary tract infection) 03/20/2016  . Subcapital fracture of hip ([place]) 03/20/2016  . Hip fracture ([place]) 03/20/2016  . Uterovaginal prolapse, complete 06/06/2013    Orientation RESPIRATION BLADDER Height & Weight     Self  O2 (3L) Incontinent Weight: 106 lb 11.2 oz (48.399 kg) Height:  5\' 2"  (157.5 cm)  BEHAVIORAL SYMPTOMS/MOOD NEUROLOGICAL BOWEL NUTRITION STATUS  Wanderer   Incontinent Diet (pending speech)  AMBULATORY STATUS COMMUNICATION OF NEEDS Skin   Limited Assist Verbally Surgical wounds                       Personal Care Assistance Level of Assistance  Bathing, Feeding, Dressing Bathing Assistance: Limited assistance Feeding assistance: Limited assistance Dressing Assistance: Limited assistance     Functional Limitations Info  Hearing, Speech, Sight Sight Info: Adequate Hearing Info:  Adequate Speech Info: Adequate    SPECIAL CARE FACTORS FREQUENCY  Speech therapy     PT Frequency: 3       Speech Therapy Frequency: minimum 1x/week      Contractures Contractures Info: Not present    Additional Factors Info  Code Status, Allergies, Psychotropic, Insulin Sliding Scale, Isolation Precautions Code Status Info: DNR Allergies Info: NKA Psychotropic Info: none Insulin Sliding Scale Info: none Isolation Precautions Info: none     Current Medications (03/24/2016):  This is the current hospital active medication list Current Facility-Administered Medications  Medication Dose Route Frequency Provider Last Rate Last Dose  . 0.9 %  sodium chloride infusion   Intravenous Continuous Edwin Dada, MD 75 mL/hr at 03/23/16 2140    . acetaminophen (TYLENOL) tablet 650 mg  650 mg Oral Q6H PRN Carole Civil, MD       Or  . acetaminophen (TYLENOL) suppository 650 mg  650 mg Rectal Q6H PRN Carole Civil, MD      . amLODipine (NORVASC) tablet 5 mg  5 mg Oral Daily Edwin Dada, MD   5 mg at 03/24/16 0849  . aspirin EC tablet 325 mg  325 mg Oral Q breakfast Carole Civil, MD   325 mg at 03/24/16 0848  . bisacodyl (DULCOLAX) suppository 10 mg  10 mg Rectal Daily PRN Carole Civil, MD      . cefTRIAXone (ROCEPHIN) 1 g in dextrose 5 % 50 mL IVPB  1 g Intravenous Q24H Edwin Dada, MD   1 g at 03/24/16  0256  . docusate sodium (COLACE) capsule 100 mg  100 mg Oral BID Carole Civil, MD   100 mg at 03/24/16 0848  . donepezil (ARICEPT) tablet 10 mg  10 mg Oral QHS Edwin Dada, MD   10 mg at 03/23/16 2141  . feeding supplement (ENSURE ENLIVE) (ENSURE ENLIVE) liquid 237 mL  237 mL Oral BID BM Barton Dubois, MD   237 mL at 03/24/16 0847  . levothyroxine (SYNTHROID, LEVOTHROID) tablet 25 mcg  25 mcg Oral QAC breakfast Edwin Dada, MD   25 mcg at 03/24/16 0848  . menthol-cetylpyridinium (CEPACOL) lozenge 3 mg  1 lozenge Oral  PRN Carole Civil, MD       Or  . phenol (CHLORASEPTIC) mouth spray 1 spray  1 spray Mouth/Throat PRN Carole Civil, MD      . metoCLOPramide (REGLAN) tablet 5-10 mg  5-10 mg Oral Q8H PRN Carole Civil, MD       Or  . metoCLOPramide (REGLAN) injection 5-10 mg  5-10 mg Intravenous Q8H PRN Carole Civil, MD      . metoprolol succinate (TOPROL-XL) 24 hr tablet 50 mg  50 mg Oral Daily Edwin Dada, MD   50 mg at 03/24/16 0849  . morphine 2 MG/ML injection 1 mg  1 mg Intravenous Q4H PRN Barton Dubois, MD      . ondansetron [facility]) tablet 4 mg  4 mg Oral Q6H PRN Carole Civil, MD       Or  . ondansetron [facility]) injection 4 mg  4 mg Intravenous Q6H PRN Carole Civil, MD      . polyethylene glycol (MIRALAX / GLYCOLAX) packet 17 g  17 g Oral Daily Carole Civil, MD   17 g at 03/24/16 0847  . senna-docusate (Senokot-S) tablet 1 tablet  1 tablet Oral QHS PRN Edwin Dada, MD      . simvastatin (ZOCOR) tablet 40 mg  40 mg Oral Daily Edwin Dada, MD   40 mg at 03/24/16 0848  . traMADol (ULTRAM) tablet 50 mg  50 mg Oral Q6H PRN Carole Civil, MD   50 mg at 03/23/16 2141     Discharge Medications: Please see discharge summary for a list of discharge medications.  Relevant Imaging Results:  Relevant Lab Results:   Additional Information Needs to start Home Health:  PT  Everhett Bozard, Clydene Pugh, LCSW

## 2016-03-24 NOTE — Progress Notes (Signed)
Patient is a resident from memory care unit at [facility]. Patient can return to [facility] with HH/PT as they have there own providers in house. Patient would be a poor candidate for SNF due to cognition.    MD made aware of plan and need of Home Health order.  [facility] made aware of plan and accepting of plan. Nanine Means will transport back to facility.   All clinicals will be sent to facility.   Family (granddaughter made aware of plan for patient and is in agreement)  DNR signed FL2 updated.  Lane Hacker, MSW Clinical Social Work: System [employer] [phone]

## 2016-03-24 NOTE — Discharge Summary (Signed)
Physician Discharge Summary  Paula [name] [id number] DOB: 1923-09-06 DOA: 03/19/2016  PCP: Wende Neighbors, MD  Admit date: 03/19/2016 Discharge date: 03/24/2016  Time spent: 35 minutes  Recommendations for Outpatient Follow-up:  1. Follow up with PCP 1-2 weeks. 2. Discharge back to [facility] with HH/PT. 3. Per ortho: Weightbearing as tolerated. Continue physical therapy. Follow up with Dr. [name] in 2 weeks.    Discharge Diagnoses:  Principal Problem:   Subcapital fracture of right hip (HCC) Active Problems:   Essential hypertension   Controlled type 2 diabetes mellitus without complication, without long-term current use of insulin (HCC)   Hypothyroidism, acquired   CKD (chronic kidney disease) stage 3, GFR 30-59 ml/min   UTI (lower urinary tract infection)   Subcapital fracture of hip (HCC)   Hip fracture [facility])   Discharge Condition: Improved   Diet recommendation: heart healthy   Filed Weights   03/20/16 0514  Weight: 48.399 kg (106 lb 11.2 oz)    History of present illness:  82 yof with past medical history of dementia, hypothyroidism, and HTN presented with complaints of right hip pain s/p mechanical fall. In the ED, the patient was hemodynamically stable and a plain radiograph of the R hip showed a possible subcapital R femoral fracture, confirmed by CT. The case was discussed with Dr. [name] who agreed to see the patient, and [facility] were asked to admit for medical management.    Hospital Course:  Patient was admitted due to right hip fracture noted on admission CT scan. Ortho consulted and performed ORIF 4/6 without complication. Status post surgery patient was placed on ASA for DVT prophylaxis and weight bearing as tolerated. Per PT SNF was recommended on discharge.   1. UTI. Pyuria and bacteriuria on UA. Afebrile. She appears confused and WBC was elevated on admission, though is now wnl. Urine Cx showed multiple species and recollection is recommended. She has  completed a course of abx at the hospital.  2. HTN. Continue current meds and monitor. 3. NIDDM. Currently diet controlled. Check CBG daily. 4. CKD stage 3. Creatinine at baseline. Avoid nephrotoxins. Continue to monitor. 5. Hypothyroidism. Continue synthroid. 6. Dysphagia. Most likely from intubation, anesthesia, and pain meds. SPL evaluated, input appreciated. Continue supportive care. 7. HLD. Continue statins. 8. Dementia.Stable.   Procedures:  Right hip ORIF on 4/6  Consultations:  Ortho  PT- SNF  Discharge Exam: Filed Vitals:   03/24/16 0648 03/24/16 0843  BP: 182/67 166/62  Pulse: 88 89  Temp: 98.3 F (36.8 C)   Resp: 20     General: NAD, looks comfortable Cardiovascular: RRR, S1, S2  Respiratory: clear bilaterally, No wheezing, rales or rhonchi Abdomen: soft, non tender, no distention , bowel sounds normal Musculoskeletal: No edema b/l  Discharge Instructions    Current Discharge Medication List    START taking these medications   Details  aspirin EC 325 MG EC tablet Take 1 tablet (325 mg total) by mouth daily with breakfast. Qty: 30 tablet, Refills: 0    polyethylene glycol (MIRALAX / GLYCOLAX) packet Take 17 g by mouth daily. Qty: 14 each, Refills: 0    senna-docusate (SENOKOT-S) 8.6-50 MG tablet Take 1 tablet by mouth at bedtime as needed for mild constipation.    traMADol (ULTRAM) 50 MG tablet Take 1 tablet (50 mg total) by mouth every 6 (six) hours as needed for moderate pain. Qty: 30 tablet, Refills: 0      CONTINUE these medications which have NOT CHANGED   Details  amLODipine-valsartan (  EXFORGE) 5-320 MG tablet Take 1 tablet by mouth daily.    donepezil (ARICEPT) 10 MG tablet Take 10 mg by mouth at bedtime.    levothyroxine (SYNTHROID, LEVOTHROID) 25 MCG tablet Take 25 mcg by mouth daily.      metoprolol (TOPROL-XL) 50 MG 24 hr tablet Take 50 mg by mouth daily.      simvastatin (ZOCOR) 40 MG tablet Take 40 mg by mouth daily.         STOP taking these medications     amLODipine (NORVASC) 5 MG tablet        No Known Allergies    The results of significant diagnostics from this hospitalization (including imaging, microbiology, ancillary and laboratory) are listed below for reference.    Significant Diagnostic Studies: Dg Chest 1 View  03/20/2016  CLINICAL DATA:  [place] tonight. EXAM: CHEST 1 VIEW COMPARISON:  None. FINDINGS: A single AP view of the chest demonstrates no focal airspace consolidation or alveolar edema. The lungs are grossly clear. There is no large effusion or pneumothorax. Cardiac and mediastinal contours appear unremarkable. IMPRESSION: No active disease. Electronically Signed   By: Andreas Newport M.D.   On: 03/20/2016 00:08   Dg Pelvis 1-2 Views  03/20/2016  CLINICAL DATA:  [place] tonight. EXAM: RIGHT FEMUR 2 VIEWS; PELVIS - 1-2 VIEW COMPARISON:  None. FINDINGS: A single AP view the pelvis demonstrates probable subcapital right hip fracture. No dislocation. No bone lesion or bony destruction to suggest pathologic basis the fracture. Pubic symphysis and sacroiliac joints appear intact. Remainder of the femur is intact. IMPRESSION: Probable subcapital right hip fracture. No dislocation Electronically Signed   By: Andreas Newport M.D.   On: 03/20/2016 00:10   Ct Head Wo Contrast  03/20/2016  CLINICAL DATA:  Fall with altered level of consciousness. EXAM: CT HEAD WITHOUT CONTRAST CT CERVICAL SPINE WITHOUT CONTRAST TECHNIQUE: Multidetector CT imaging of the head and cervical spine was performed following the standard protocol without intravenous contrast. Multiplanar CT image reconstructions of the cervical spine were also generated. COMPARISON:  None. FINDINGS: CT HEAD FINDINGS Diffuse atrophy present as well as moderate small vessel ischemic changes in the periventricular white matter. The brain demonstrates no evidence of hemorrhage, infarction, edema, mass effect, extra-axial fluid collection, hydrocephalus or  mass lesion. The skull is unremarkable. CT CERVICAL SPINE FINDINGS The cervical spine shows normal alignment. There is no evidence of acute fracture or subluxation. No soft tissue swelling or hematoma is identified. Spondylosis present at C4-5, C5-6 and C6-7. No bony lesions identified. The visualized airway is normally patent. In the visualized left lobe of the thyroid gland, focal nodularity is seen measuring approximately 1.3 cm. IMPRESSION: 1. Small vessel disease and atrophy of the brain. No acute findings by head CT. 2. No acute cervical injury identified. Cervical spondylosis present at C4-5, C5-6 and C6-7. 3. Incidental detection of left thyroid nodule measuring approximately 1.3 cm. At the patient's age, this is likely not of significance. Elective thyroid ultrasound could be considered. Electronically Signed   By: Aletta Edouard M.D.   On: 03/20/2016 00:14   Ct Cervical Spine Wo Contrast  03/20/2016  CLINICAL DATA:  Fall with altered level of consciousness. EXAM: CT HEAD WITHOUT CONTRAST CT CERVICAL SPINE WITHOUT CONTRAST TECHNIQUE: Multidetector CT imaging of the head and cervical spine was performed following the standard protocol without intravenous contrast. Multiplanar CT image reconstructions of the cervical spine were also generated. COMPARISON:  None. FINDINGS: CT HEAD FINDINGS Diffuse atrophy present as well as  moderate small vessel ischemic changes in the periventricular white matter. The brain demonstrates no evidence of hemorrhage, infarction, edema, mass effect, extra-axial fluid collection, hydrocephalus or mass lesion. The skull is unremarkable. CT CERVICAL SPINE FINDINGS The cervical spine shows normal alignment. There is no evidence of acute fracture or subluxation. No soft tissue swelling or hematoma is identified. Spondylosis present at C4-5, C5-6 and C6-7. No bony lesions identified. The visualized airway is normally patent. In the visualized left lobe of the thyroid gland, focal  nodularity is seen measuring approximately 1.3 cm. IMPRESSION: 1. Small vessel disease and atrophy of the brain. No acute findings by head CT. 2. No acute cervical injury identified. Cervical spondylosis present at C4-5, C5-6 and C6-7. 3. Incidental detection of left thyroid nodule measuring approximately 1.3 cm. At the patient's age, this is likely not of significance. Elective thyroid ultrasound could be considered. Electronically Signed   By: Aletta Edouard M.D.   On: 03/20/2016 00:14   Ct Hip Right Wo Contrast  03/20/2016  CLINICAL DATA:  [facility] home tonight. EXAM: CT OF THE RIGHT HIP WITHOUT CONTRAST TECHNIQUE: Multidetector CT imaging of the right hip was performed according to the standard protocol. Multiplanar CT image reconstructions were also generated. COMPARISON:  Radiographs 03/19/2016 FINDINGS: There is a subcapital right hip fracture with slight impaction. There is no dislocation. No bone lesion or bony destruction. No significant hematoma or other acute soft tissue abnormality. IMPRESSION: Subcapital right hip fracture Electronically Signed   By: Andreas Newport M.D.   On: 03/20/2016 01:34   Dg Hip Operative Unilat With Pelvis Right  03/20/2016  CLINICAL DATA:  ORIF femoral neck fracture EXAM: OPERATIVE right HIP (WITH PELVIS IF PERFORMED)  VIEWS TECHNIQUE: Fluoroscopic spot image(s) were submitted for interpretation post-operatively. COMPARISON:  CT same day FINDINGS: Multiple C-arm images show placement of 3 Knowles type pins for treatment of a femoral neck fracture. Pins appear well positioned and there is no radiographically detectable complication. IMPRESSION: Placement of 3 cannulated pins for treatment of a femoral neck fracture. Electronically Signed   By: Nelson Chimes M.D.   On: 03/20/2016 13:17   Dg Femur, Min 2 Views Right  03/20/2016  CLINICAL DATA:  [place] tonight. EXAM: RIGHT FEMUR 2 VIEWS; PELVIS - 1-2 VIEW COMPARISON:  None. FINDINGS: A single AP view the pelvis  demonstrates probable subcapital right hip fracture. No dislocation. No bone lesion or bony destruction to suggest pathologic basis the fracture. Pubic symphysis and sacroiliac joints appear intact. Remainder of the femur is intact. IMPRESSION: Probable subcapital right hip fracture. No dislocation Electronically Signed   By: Andreas Newport M.D.   On: 03/20/2016 00:10    Microbiology: Recent Results (from the past 240 hour(s))  Urine culture     Status: None   Collection Time: 03/20/16  1:20 AM  Result Value Ref Range Status   Specimen Description URINE, CATHETERIZED  Final   Special Requests NONE  Final   Culture MULTIPLE SPECIES PRESENT, SUGGEST RECOLLECTION  Final   Report Status 03/21/2016 FINAL  Final  Surgical pcr screen     Status: None   Collection Time: 03/20/16  8:30 AM  Result Value Ref Range Status   MRSA, PCR NEGATIVE NEGATIVE Final   Staphylococcus aureus NEGATIVE NEGATIVE Final    Comment:        The Xpert SA Assay (FDA approved for NASAL specimens in patients over 7 years of age), is one component of a comprehensive surveillance program.  Test performance has  been validated by [facility] for patients greater than or equal to 16 year old. It is not intended to diagnose infection nor to guide or monitor treatment.      Labs: Basic Metabolic Panel:  Recent Labs Lab 03/20/16 0030 03/20/16 0534 03/21/16 0551 03/22/16 0604  NA 139 139 139 139  K 4.3 4.3 3.8 3.5  CL 104 105 105 106  CO2 26 22 23 25   GLUCOSE 139* 167* 123* 98  BUN 37* 34* 18 18  CREATININE 1.10* 1.03* 0.90 0.82  CALCIUM 8.8* 8.9 8.6* 8.2*   Liver Function Tests:  Recent Labs Lab 03/20/16 0030  AST 45*  ALT 22  ALKPHOS 76  BILITOT 1.0  PROT 6.5  ALBUMIN 3.7  CBC:  Recent Labs Lab 03/20/16 0030 03/20/16 0534 03/21/16 0551 03/22/16 0604 03/23/16 0546  WBC 15.8* 15.2* 14.3* 11.6* 9.0  NEUTROABS 13.3*  --   --   --   --   HGB 12.7 12.8 12.1 11.6* 9.9*  HCT 38.0 38.7  36.3 35.1* 30.8*  MCV 91.8 92.1 91.2 90.9 93.3  PLT 365 350 355 328 286   CBG:  Recent Labs Lab 03/20/16 1040 03/20/16 1326 03/22/16 0519 03/23/16 0455 03/24/16 0500  GLUCAP 118* 129* 99 83 94       Signed:  Kathie Dike, MD [facility] 03/24/2016, 8:51 AM   By signing my name below, I, Rennis Harding, attest that this documentation has been prepared under the direction and in the presence of Kathie Dike, MD. Electronically signed: Rennis Harding, Scribe. 03/24/2016 11:19am   I, Dr. Kathie Dike, personally performed the services described in this documentaiton. All medical record entries made by the scribe were at my direction and in my presence. I have reviewed the chart and agree that the record reflects my personal performance and is accurate and complete  Kathie Dike, MD, 03/24/2016 11:28 AM

## 2016-03-25 ENCOUNTER — Encounter (HOSPITAL_COMMUNITY): Payer: Self-pay | Admitting: Orthopedic Surgery

## 2016-03-25 DIAGNOSIS — E785 Hyperlipidemia, unspecified: Secondary | ICD-10-CM | POA: Diagnosis not present

## 2016-03-25 DIAGNOSIS — F419 Anxiety disorder, unspecified: Secondary | ICD-10-CM | POA: Diagnosis not present

## 2016-03-25 DIAGNOSIS — R2681 Unsteadiness on feet: Secondary | ICD-10-CM | POA: Diagnosis not present

## 2016-03-25 DIAGNOSIS — F0280 Dementia in other diseases classified elsewhere without behavioral disturbance: Secondary | ICD-10-CM | POA: Diagnosis not present

## 2016-03-25 DIAGNOSIS — E039 Hypothyroidism, unspecified: Secondary | ICD-10-CM | POA: Diagnosis not present

## 2016-03-25 DIAGNOSIS — I10 Essential (primary) hypertension: Secondary | ICD-10-CM | POA: Diagnosis not present

## 2016-03-25 DIAGNOSIS — E119 Type 2 diabetes mellitus without complications: Secondary | ICD-10-CM | POA: Diagnosis not present

## 2016-03-25 DIAGNOSIS — G309 Alzheimer's disease, unspecified: Secondary | ICD-10-CM | POA: Diagnosis not present

## 2016-03-25 DIAGNOSIS — M6281 Muscle weakness (generalized): Secondary | ICD-10-CM | POA: Diagnosis not present

## 2016-03-26 DIAGNOSIS — M84359A Stress fracture, hip, unspecified, initial encounter for fracture: Secondary | ICD-10-CM | POA: Diagnosis not present

## 2016-03-27 DIAGNOSIS — G309 Alzheimer's disease, unspecified: Secondary | ICD-10-CM | POA: Diagnosis not present

## 2016-03-27 DIAGNOSIS — F419 Anxiety disorder, unspecified: Secondary | ICD-10-CM | POA: Diagnosis not present

## 2016-03-28 DIAGNOSIS — I10 Essential (primary) hypertension: Secondary | ICD-10-CM | POA: Diagnosis not present

## 2016-03-28 DIAGNOSIS — F419 Anxiety disorder, unspecified: Secondary | ICD-10-CM | POA: Diagnosis not present

## 2016-03-28 DIAGNOSIS — E039 Hypothyroidism, unspecified: Secondary | ICD-10-CM | POA: Diagnosis not present

## 2016-03-28 DIAGNOSIS — M6281 Muscle weakness (generalized): Secondary | ICD-10-CM | POA: Diagnosis not present

## 2016-03-28 DIAGNOSIS — G309 Alzheimer's disease, unspecified: Secondary | ICD-10-CM | POA: Diagnosis not present

## 2016-03-28 DIAGNOSIS — E785 Hyperlipidemia, unspecified: Secondary | ICD-10-CM | POA: Diagnosis not present

## 2016-03-28 DIAGNOSIS — R2681 Unsteadiness on feet: Secondary | ICD-10-CM | POA: Diagnosis not present

## 2016-03-28 DIAGNOSIS — E119 Type 2 diabetes mellitus without complications: Secondary | ICD-10-CM | POA: Diagnosis not present

## 2016-03-28 DIAGNOSIS — F0280 Dementia in other diseases classified elsewhere without behavioral disturbance: Secondary | ICD-10-CM | POA: Diagnosis not present

## 2016-04-02 DIAGNOSIS — R2681 Unsteadiness on feet: Secondary | ICD-10-CM | POA: Diagnosis not present

## 2016-04-02 DIAGNOSIS — E039 Hypothyroidism, unspecified: Secondary | ICD-10-CM | POA: Diagnosis not present

## 2016-04-02 DIAGNOSIS — F419 Anxiety disorder, unspecified: Secondary | ICD-10-CM | POA: Diagnosis not present

## 2016-04-02 DIAGNOSIS — F0280 Dementia in other diseases classified elsewhere without behavioral disturbance: Secondary | ICD-10-CM | POA: Diagnosis not present

## 2016-04-02 DIAGNOSIS — M6281 Muscle weakness (generalized): Secondary | ICD-10-CM | POA: Diagnosis not present

## 2016-04-02 DIAGNOSIS — I10 Essential (primary) hypertension: Secondary | ICD-10-CM | POA: Diagnosis not present

## 2016-04-02 DIAGNOSIS — E785 Hyperlipidemia, unspecified: Secondary | ICD-10-CM | POA: Diagnosis not present

## 2016-04-02 DIAGNOSIS — E119 Type 2 diabetes mellitus without complications: Secondary | ICD-10-CM | POA: Diagnosis not present

## 2016-04-02 DIAGNOSIS — G309 Alzheimer's disease, unspecified: Secondary | ICD-10-CM | POA: Diagnosis not present

## 2016-04-10 ENCOUNTER — Ambulatory Visit: Payer: PPO | Admitting: Orthopedic Surgery

## 2016-04-10 ENCOUNTER — Ambulatory Visit: Payer: PPO

## 2016-04-10 ENCOUNTER — Other Ambulatory Visit: Payer: Self-pay | Admitting: Orthopedic Surgery

## 2016-04-10 ENCOUNTER — Ambulatory Visit (HOSPITAL_COMMUNITY)
Admission: RE | Admit: 2016-04-10 | Discharge: 2016-04-10 | Disposition: A | Discharge status: Home / Self Care | Payer: PPO | Source: Ambulatory Visit | Attending: Orthopedic Surgery | Admitting: Orthopedic Surgery

## 2016-04-10 VITALS — BP 151/73 | HR 86 | Ht 62.0 in | Wt 106.0 lb

## 2016-04-10 DIAGNOSIS — S72001D Fracture of unspecified part of neck of right femur, subsequent encounter for closed fracture with routine healing: Secondary | ICD-10-CM

## 2016-04-10 DIAGNOSIS — X58XXXD Exposure to other specified factors, subsequent encounter: Secondary | ICD-10-CM | POA: Diagnosis not present

## 2016-04-10 DIAGNOSIS — F419 Anxiety disorder, unspecified: Secondary | ICD-10-CM | POA: Diagnosis not present

## 2016-04-10 DIAGNOSIS — M160 Bilateral primary osteoarthritis of hip: Secondary | ICD-10-CM | POA: Diagnosis not present

## 2016-04-10 DIAGNOSIS — R2681 Unsteadiness on feet: Secondary | ICD-10-CM | POA: Diagnosis not present

## 2016-04-10 DIAGNOSIS — G309 Alzheimer's disease, unspecified: Secondary | ICD-10-CM | POA: Diagnosis not present

## 2016-04-10 DIAGNOSIS — F0280 Dementia in other diseases classified elsewhere without behavioral disturbance: Secondary | ICD-10-CM | POA: Diagnosis not present

## 2016-04-10 DIAGNOSIS — E039 Hypothyroidism, unspecified: Secondary | ICD-10-CM | POA: Diagnosis not present

## 2016-04-10 DIAGNOSIS — I10 Essential (primary) hypertension: Secondary | ICD-10-CM | POA: Diagnosis not present

## 2016-04-10 DIAGNOSIS — E785 Hyperlipidemia, unspecified: Secondary | ICD-10-CM | POA: Diagnosis not present

## 2016-04-10 DIAGNOSIS — M6281 Muscle weakness (generalized): Secondary | ICD-10-CM | POA: Diagnosis not present

## 2016-04-10 DIAGNOSIS — E119 Type 2 diabetes mellitus without complications: Secondary | ICD-10-CM | POA: Diagnosis not present

## 2016-04-10 DIAGNOSIS — S72002D Fracture of unspecified part of neck of left femur, subsequent encounter for closed fracture with routine healing: Secondary | ICD-10-CM

## 2016-04-10 NOTE — Progress Notes (Signed)
Chief Complaint  Patient presents with  . Follow-up    Right hip   Follow-up right hip fracture status post internal fixation the surgery 03/20/2016 BP 151/73 mmHg  Pulse 86  Ht 5\' 2"  (1.575 m)  Wt 106 lb (48.081 kg)  BMI 19.38 kg/m2  Staples were left and even though we told to take them out at 2 weeks suture line looks good however. Leg lengths are equal. No pain with flexion of the hip.  Recommend x-ray in 4 weeks  Patients weightbearing as tolerated  Physical therapy should be instituted to progress with gait training.

## 2016-04-10 NOTE — Patient Instructions (Signed)
WBAT   PT GAIT TRAINING

## 2016-04-16 DIAGNOSIS — I10 Essential (primary) hypertension: Secondary | ICD-10-CM | POA: Diagnosis not present

## 2016-04-16 DIAGNOSIS — E785 Hyperlipidemia, unspecified: Secondary | ICD-10-CM | POA: Diagnosis not present

## 2016-04-16 DIAGNOSIS — M84359A Stress fracture, hip, unspecified, initial encounter for fracture: Secondary | ICD-10-CM | POA: Diagnosis not present

## 2016-04-16 DIAGNOSIS — G309 Alzheimer's disease, unspecified: Secondary | ICD-10-CM | POA: Diagnosis not present

## 2016-04-16 DIAGNOSIS — F419 Anxiety disorder, unspecified: Secondary | ICD-10-CM | POA: Diagnosis not present

## 2016-04-16 DIAGNOSIS — E038 Other specified hypothyroidism: Secondary | ICD-10-CM | POA: Diagnosis not present

## 2016-04-17 DIAGNOSIS — I10 Essential (primary) hypertension: Secondary | ICD-10-CM | POA: Diagnosis not present

## 2016-04-17 DIAGNOSIS — R2681 Unsteadiness on feet: Secondary | ICD-10-CM | POA: Diagnosis not present

## 2016-04-17 DIAGNOSIS — E039 Hypothyroidism, unspecified: Secondary | ICD-10-CM | POA: Diagnosis not present

## 2016-04-17 DIAGNOSIS — F0280 Dementia in other diseases classified elsewhere without behavioral disturbance: Secondary | ICD-10-CM | POA: Diagnosis not present

## 2016-04-17 DIAGNOSIS — E785 Hyperlipidemia, unspecified: Secondary | ICD-10-CM | POA: Diagnosis not present

## 2016-04-17 DIAGNOSIS — G309 Alzheimer's disease, unspecified: Secondary | ICD-10-CM | POA: Diagnosis not present

## 2016-04-17 DIAGNOSIS — F419 Anxiety disorder, unspecified: Secondary | ICD-10-CM | POA: Diagnosis not present

## 2016-04-17 DIAGNOSIS — M6281 Muscle weakness (generalized): Secondary | ICD-10-CM | POA: Diagnosis not present

## 2016-04-17 DIAGNOSIS — E119 Type 2 diabetes mellitus without complications: Secondary | ICD-10-CM | POA: Diagnosis not present

## 2016-04-23 DIAGNOSIS — F419 Anxiety disorder, unspecified: Secondary | ICD-10-CM | POA: Diagnosis not present

## 2016-04-23 DIAGNOSIS — I10 Essential (primary) hypertension: Secondary | ICD-10-CM | POA: Diagnosis not present

## 2016-04-23 DIAGNOSIS — E785 Hyperlipidemia, unspecified: Secondary | ICD-10-CM | POA: Diagnosis not present

## 2016-04-23 DIAGNOSIS — F0280 Dementia in other diseases classified elsewhere without behavioral disturbance: Secondary | ICD-10-CM | POA: Diagnosis not present

## 2016-04-23 DIAGNOSIS — R2681 Unsteadiness on feet: Secondary | ICD-10-CM | POA: Diagnosis not present

## 2016-04-23 DIAGNOSIS — E119 Type 2 diabetes mellitus without complications: Secondary | ICD-10-CM | POA: Diagnosis not present

## 2016-04-23 DIAGNOSIS — M6281 Muscle weakness (generalized): Secondary | ICD-10-CM | POA: Diagnosis not present

## 2016-04-23 DIAGNOSIS — G309 Alzheimer's disease, unspecified: Secondary | ICD-10-CM | POA: Diagnosis not present

## 2016-04-23 DIAGNOSIS — E039 Hypothyroidism, unspecified: Secondary | ICD-10-CM | POA: Diagnosis not present

## 2016-04-30 DIAGNOSIS — I10 Essential (primary) hypertension: Secondary | ICD-10-CM | POA: Diagnosis not present

## 2016-04-30 DIAGNOSIS — M79674 Pain in right toe(s): Secondary | ICD-10-CM | POA: Diagnosis not present

## 2016-04-30 DIAGNOSIS — E785 Hyperlipidemia, unspecified: Secondary | ICD-10-CM | POA: Diagnosis not present

## 2016-04-30 DIAGNOSIS — E119 Type 2 diabetes mellitus without complications: Secondary | ICD-10-CM | POA: Diagnosis not present

## 2016-04-30 DIAGNOSIS — F0280 Dementia in other diseases classified elsewhere without behavioral disturbance: Secondary | ICD-10-CM | POA: Diagnosis not present

## 2016-04-30 DIAGNOSIS — M6281 Muscle weakness (generalized): Secondary | ICD-10-CM | POA: Diagnosis not present

## 2016-04-30 DIAGNOSIS — G309 Alzheimer's disease, unspecified: Secondary | ICD-10-CM | POA: Diagnosis not present

## 2016-04-30 DIAGNOSIS — M79675 Pain in left toe(s): Secondary | ICD-10-CM | POA: Diagnosis not present

## 2016-04-30 DIAGNOSIS — L600 Ingrowing nail: Secondary | ICD-10-CM | POA: Diagnosis not present

## 2016-04-30 DIAGNOSIS — E039 Hypothyroidism, unspecified: Secondary | ICD-10-CM | POA: Diagnosis not present

## 2016-04-30 DIAGNOSIS — R2681 Unsteadiness on feet: Secondary | ICD-10-CM | POA: Diagnosis not present

## 2016-04-30 DIAGNOSIS — F419 Anxiety disorder, unspecified: Secondary | ICD-10-CM | POA: Diagnosis not present

## 2016-05-07 DIAGNOSIS — R21 Rash and other nonspecific skin eruption: Secondary | ICD-10-CM | POA: Diagnosis not present

## 2016-05-08 DIAGNOSIS — G309 Alzheimer's disease, unspecified: Secondary | ICD-10-CM | POA: Diagnosis not present

## 2016-05-08 DIAGNOSIS — F419 Anxiety disorder, unspecified: Secondary | ICD-10-CM | POA: Diagnosis not present

## 2016-05-13 ENCOUNTER — Ambulatory Visit (HOSPITAL_COMMUNITY)
Admission: RE | Admit: 2016-05-13 | Discharge: 2016-05-13 | Disposition: A | Discharge status: Home / Self Care | Payer: PPO | Source: Ambulatory Visit | Attending: Orthopedic Surgery | Admitting: Orthopedic Surgery

## 2016-05-13 ENCOUNTER — Ambulatory Visit (INDEPENDENT_AMBULATORY_CARE_PROVIDER_SITE_OTHER): Payer: PPO | Admitting: Orthopedic Surgery

## 2016-05-13 VITALS — BP 149/62 | HR 82 | Ht 62.0 in | Wt 95.2 lb

## 2016-05-13 DIAGNOSIS — S72001D Fracture of unspecified part of neck of right femur, subsequent encounter for closed fracture with routine healing: Secondary | ICD-10-CM

## 2016-05-13 DIAGNOSIS — X58XXXD Exposure to other specified factors, subsequent encounter: Secondary | ICD-10-CM | POA: Diagnosis not present

## 2016-05-13 DIAGNOSIS — S72001A Fracture of unspecified part of neck of right femur, initial encounter for closed fracture: Secondary | ICD-10-CM | POA: Diagnosis not present

## 2016-05-13 NOTE — Progress Notes (Signed)
Chief Complaint  Patient presents with  . Follow-up    CANNULATED HIP PINNING DOS 03/20/16   BP 149/62 mmHg  Pulse 82  Ht 5\' 2"  (1.575 m)  Wt 95 lb 3.2 oz (43.182 kg)  BMI 17.41 kg/m2  XRAYS SHOW FRACTURE HEALING AND IMPLANTS IN APPROPRIATE POSITION  RETURN IN 4 WEEKS FOR LAST XRAY

## 2016-05-14 DIAGNOSIS — G309 Alzheimer's disease, unspecified: Secondary | ICD-10-CM | POA: Diagnosis not present

## 2016-05-14 DIAGNOSIS — I10 Essential (primary) hypertension: Secondary | ICD-10-CM | POA: Diagnosis not present

## 2016-05-14 DIAGNOSIS — E038 Other specified hypothyroidism: Secondary | ICD-10-CM | POA: Diagnosis not present

## 2016-05-14 DIAGNOSIS — R21 Rash and other nonspecific skin eruption: Secondary | ICD-10-CM | POA: Diagnosis not present

## 2016-05-14 DIAGNOSIS — M84359A Stress fracture, hip, unspecified, initial encounter for fracture: Secondary | ICD-10-CM | POA: Diagnosis not present

## 2016-05-14 DIAGNOSIS — E785 Hyperlipidemia, unspecified: Secondary | ICD-10-CM | POA: Diagnosis not present

## 2016-05-14 DIAGNOSIS — F419 Anxiety disorder, unspecified: Secondary | ICD-10-CM | POA: Diagnosis not present

## 2016-06-05 DIAGNOSIS — F419 Anxiety disorder, unspecified: Secondary | ICD-10-CM | POA: Diagnosis not present

## 2016-06-05 DIAGNOSIS — G309 Alzheimer's disease, unspecified: Secondary | ICD-10-CM | POA: Diagnosis not present

## 2016-06-11 DIAGNOSIS — F419 Anxiety disorder, unspecified: Secondary | ICD-10-CM | POA: Diagnosis not present

## 2016-06-11 DIAGNOSIS — R21 Rash and other nonspecific skin eruption: Secondary | ICD-10-CM | POA: Diagnosis not present

## 2016-06-11 DIAGNOSIS — E038 Other specified hypothyroidism: Secondary | ICD-10-CM | POA: Diagnosis not present

## 2016-06-11 DIAGNOSIS — G309 Alzheimer's disease, unspecified: Secondary | ICD-10-CM | POA: Diagnosis not present

## 2016-06-11 DIAGNOSIS — E785 Hyperlipidemia, unspecified: Secondary | ICD-10-CM | POA: Diagnosis not present

## 2016-06-11 DIAGNOSIS — I10 Essential (primary) hypertension: Secondary | ICD-10-CM | POA: Diagnosis not present

## 2016-06-11 DIAGNOSIS — M84359A Stress fracture, hip, unspecified, initial encounter for fracture: Secondary | ICD-10-CM | POA: Diagnosis not present

## 2016-06-25 ENCOUNTER — Ambulatory Visit (INDEPENDENT_AMBULATORY_CARE_PROVIDER_SITE_OTHER): Payer: PPO | Admitting: Orthopaedic Surgery

## 2016-06-25 ENCOUNTER — Ambulatory Visit (INDEPENDENT_AMBULATORY_CARE_PROVIDER_SITE_OTHER): Payer: PPO

## 2016-06-25 ENCOUNTER — Encounter: Payer: Self-pay | Admitting: Orthopaedic Surgery

## 2016-06-25 VITALS — BP 117/88 | HR 95 | Ht 62.0 in | Wt 95.0 lb

## 2016-06-25 DIAGNOSIS — M25551 Pain in right hip: Secondary | ICD-10-CM | POA: Diagnosis not present

## 2016-06-25 DIAGNOSIS — M25561 Pain in right knee: Secondary | ICD-10-CM

## 2016-06-25 NOTE — Progress Notes (Signed)
Patient [name], female DOB:[birth year], 82 y.o. [id number]  Chief Complaint  Patient presents with  . Hip Pain    right upper leg pain, s/p surgery 03/20/16 hip fx    HPI  Paula [name] is a 82 y.o. female who had femoral neck surgery on 03-20-16 with cannulated screws placed.  She has done well until this last week when she had pain of the right mid thigh and proximal thigh.  She uses a walker.  There has been no trauma or untoward event.  She has no redness, no numbness.  She has some pain of the right knee at times also.  She has no swelling of the knee.  Her granddaughter brings her in today.  Patient is hard of hearing.  HPI  Body mass index is 17.37 kg/(m^2).  ROS  Review of Systems  HENT: Negative for congestion.   Respiratory: Positive for shortness of breath. Negative for cough.   Cardiovascular: Negative for chest pain and leg swelling.  Endocrine: Positive for cold intolerance.  Musculoskeletal: Positive for arthralgias and gait problem.  Allergic/Immunologic: Positive for environmental allergies.    Past Medical History  Diagnosis Date  . Diabetes mellitus   . Hypertension   . Diagnosis unknown   . High cholesterol     Past Surgical History  Procedure Laterality Date  . Eye surgery    . Hip pinning,cannulated Right 03/20/2016    Procedure: INSERT SCREWS RIGHT HIP-CANNULATED PINNING RIGHT HIP;  Surgeon: Carole Civil, MD;  Location: [facility];  Service: Orthopedics;  Laterality: Right;    Family History  Problem Relation Age of Onset  . Hypertension Son   . Hypertension Grandchild     Social History Social History  Substance Use Topics  . Smoking status: Never Smoker   . Smokeless tobacco: Never Used  . Alcohol Use: No    No Known Allergies  Current Outpatient Prescriptions  Medication Sig Dispense Refill  . acetaminophen (TYLENOL) 500 MG chewable tablet Chew 500 mg by mouth every 6 (six) hours as needed for pain.    Marland Kitchen  amLODipine-valsartan (EXFORGE) 5-320 MG tablet Take 1 tablet by mouth daily.    Marland Kitchen aspirin EC 325 MG EC tablet Take 1 tablet (325 mg total) by mouth daily with breakfast. 30 tablet 0  . donepezil (ARICEPT) 10 MG tablet Take 10 mg by mouth at bedtime.    Marland Kitchen levothyroxine (SYNTHROID, LEVOTHROID) 25 MCG tablet Take 25 mcg by mouth daily.      Marland Kitchen LORazepam (ATIVAN) 0.5 MG tablet Take 0.5 mg by mouth every 8 (eight) hours.    . metoprolol (TOPROL-XL) 50 MG 24 hr tablet Take 50 mg by mouth daily.      . polyethylene glycol (MIRALAX / GLYCOLAX) packet Take 17 g by mouth daily. 14 each 0  . senna-docusate (SENOKOT-S) 8.6-50 MG tablet Take 1 tablet by mouth at bedtime as needed for mild constipation.    . simvastatin (ZOCOR) 40 MG tablet Take 40 mg by mouth daily.      . traMADol (ULTRAM) 50 MG tablet Take 1 tablet (50 mg total) by mouth every 6 (six) hours as needed for moderate pain. 30 tablet 0   No current facility-administered medications for this visit.     Physical Exam  Blood pressure 117/88, pulse 95, height 5\' 2"  (1.575 m), weight 95 lb (43.092 kg).  Constitutional: overall normal hygiene, normal nutrition, well developed, normal grooming, normal body habitus. Assistive device:walker  Musculoskeletal: gait and station  Limp right, muscle tone and strength are normal, no tremors or atrophy is present.  .  Neurological: coordination overall normal.  Deep tendon reflex/nerve stretch intact.  Sensation normal.  Cranial nerves II-XII intact.   Skin:   normal overall no scars, lesions, ulcers or rashes. No psoriasis.  Psychiatric: Alert and oriented x 3.  Recent memory intact, remote memory unclear.  Normal mood and affect. Well groomed.  Good eye contact.  Cardiovascular: overall no swelling, no varicosities, no edema bilaterally, normal temperatures of the legs and arms, no clubbing, cyanosis and good capillary refill.  Lymphatic: palpation is normal.  The right knee has slight crepitus.   ROM is 0 to 100 with slight tenderness.  She has slight effusion.  The knee is stable.  There is no redness.  Strength and tone normal.  Right hip has good motion, well healed scar laterally.  She can stand and walk with walker well.  Left hip and left knee negative.  X-rays were done of the right hip and right knee, reported separately.  Hip pins are placed well with no signs of avascular necrosis.  The patient has been educated about the nature of the problem(s) and counseled on treatment options.  The patient appeared to understand what I have discussed and is in agreement with it.  Encounter Diagnoses  Name Primary?  . Right hip pain Yes  . Right knee pain     PLAN Call if any problems.  Precautions discussed.  Continue current medications.   Return to clinic 1 week   Keep regular appointment with Dr. [name].  I suggested using Aspercreme to the areas of tenderness.  Keep active.  Electronically Signed Sanjuana Kava, MD 7/12/20173:57 PM

## 2016-07-01 ENCOUNTER — Ambulatory Visit: Payer: PPO | Admitting: Orthopedic Surgery

## 2016-07-03 ENCOUNTER — Ambulatory Visit (INDEPENDENT_AMBULATORY_CARE_PROVIDER_SITE_OTHER): Payer: PPO | Admitting: Orthopedic Surgery

## 2016-07-03 VITALS — BP 139/76 | HR 90 | Ht 62.0 in | Wt 96.4 lb

## 2016-07-03 DIAGNOSIS — M25561 Pain in right knee: Secondary | ICD-10-CM | POA: Diagnosis not present

## 2016-07-03 DIAGNOSIS — S72001D Fracture of unspecified part of neck of right femur, subsequent encounter for closed fracture with routine healing: Secondary | ICD-10-CM

## 2016-07-03 NOTE — Progress Notes (Signed)
Chief Complaint  Patient presents with  . Routine Post Op    Right hip DOS 03/20/16  Follow-up visit  Follow-up status post pinning right hip femoral neck fracture cannulated screws are also complained of some pain in the right knee thought to be arthritis  Review of systems she has dementia. No swelling in the right knee  BP 139/76 mmHg  Pulse 90  Ht 5\' 2"  (1.575 m)  Wt 96 lb 6.4 oz (43.727 kg)  BMI 17.63 kg/m2  She has dementia she seems to understand some verbal cues. Oriented to person only. Mood pleasant. Gait slight limp right leg.  Right hip incision clean dry and intact normal range of motion right hip no pain no instability. Skin incision clean dry and intact  Right knee no effusion full passive range of motion without pain he stable strength normal muscle tone normal  X-ray was taken last time showed mild arthritis in the knee   Status post hip pinning on the right stable doing well Osteoarthritis right knee recommend brace 12 hours a day  Follow-up as needed

## 2016-07-09 DIAGNOSIS — F419 Anxiety disorder, unspecified: Secondary | ICD-10-CM | POA: Diagnosis not present

## 2016-07-09 DIAGNOSIS — M84359A Stress fracture, hip, unspecified, initial encounter for fracture: Secondary | ICD-10-CM | POA: Diagnosis not present

## 2016-07-09 DIAGNOSIS — R21 Rash and other nonspecific skin eruption: Secondary | ICD-10-CM | POA: Diagnosis not present

## 2016-07-09 DIAGNOSIS — E038 Other specified hypothyroidism: Secondary | ICD-10-CM | POA: Diagnosis not present

## 2016-07-09 DIAGNOSIS — E785 Hyperlipidemia, unspecified: Secondary | ICD-10-CM | POA: Diagnosis not present

## 2016-07-09 DIAGNOSIS — G309 Alzheimer's disease, unspecified: Secondary | ICD-10-CM | POA: Diagnosis not present

## 2016-07-09 DIAGNOSIS — I10 Essential (primary) hypertension: Secondary | ICD-10-CM | POA: Diagnosis not present

## 2016-07-17 DIAGNOSIS — F419 Anxiety disorder, unspecified: Secondary | ICD-10-CM | POA: Diagnosis not present

## 2016-07-17 DIAGNOSIS — G309 Alzheimer's disease, unspecified: Secondary | ICD-10-CM | POA: Diagnosis not present

## 2016-08-06 DIAGNOSIS — G309 Alzheimer's disease, unspecified: Secondary | ICD-10-CM | POA: Diagnosis not present

## 2016-08-06 DIAGNOSIS — M84359A Stress fracture, hip, unspecified, initial encounter for fracture: Secondary | ICD-10-CM | POA: Diagnosis not present

## 2016-08-06 DIAGNOSIS — I10 Essential (primary) hypertension: Secondary | ICD-10-CM | POA: Diagnosis not present

## 2016-08-06 DIAGNOSIS — F419 Anxiety disorder, unspecified: Secondary | ICD-10-CM | POA: Diagnosis not present

## 2016-08-06 DIAGNOSIS — R21 Rash and other nonspecific skin eruption: Secondary | ICD-10-CM | POA: Diagnosis not present

## 2016-08-06 DIAGNOSIS — E038 Other specified hypothyroidism: Secondary | ICD-10-CM | POA: Diagnosis not present

## 2016-08-06 DIAGNOSIS — E785 Hyperlipidemia, unspecified: Secondary | ICD-10-CM | POA: Diagnosis not present

## 2016-08-14 DIAGNOSIS — F419 Anxiety disorder, unspecified: Secondary | ICD-10-CM | POA: Diagnosis not present

## 2016-08-14 DIAGNOSIS — G309 Alzheimer's disease, unspecified: Secondary | ICD-10-CM | POA: Diagnosis not present

## 2016-08-18 DIAGNOSIS — E785 Hyperlipidemia, unspecified: Secondary | ICD-10-CM | POA: Diagnosis not present

## 2016-08-18 DIAGNOSIS — G309 Alzheimer's disease, unspecified: Secondary | ICD-10-CM | POA: Diagnosis not present

## 2016-08-18 DIAGNOSIS — I10 Essential (primary) hypertension: Secondary | ICD-10-CM | POA: Diagnosis not present

## 2016-08-18 DIAGNOSIS — F419 Anxiety disorder, unspecified: Secondary | ICD-10-CM | POA: Diagnosis not present

## 2016-08-18 DIAGNOSIS — R21 Rash and other nonspecific skin eruption: Secondary | ICD-10-CM | POA: Diagnosis not present

## 2016-08-18 DIAGNOSIS — M84359A Stress fracture, hip, unspecified, initial encounter for fracture: Secondary | ICD-10-CM | POA: Diagnosis not present

## 2016-08-18 DIAGNOSIS — E038 Other specified hypothyroidism: Secondary | ICD-10-CM | POA: Diagnosis not present

## 2016-08-27 DIAGNOSIS — M84359A Stress fracture, hip, unspecified, initial encounter for fracture: Secondary | ICD-10-CM | POA: Diagnosis not present

## 2016-08-27 DIAGNOSIS — E785 Hyperlipidemia, unspecified: Secondary | ICD-10-CM | POA: Diagnosis not present

## 2016-08-27 DIAGNOSIS — R21 Rash and other nonspecific skin eruption: Secondary | ICD-10-CM | POA: Diagnosis not present

## 2016-08-27 DIAGNOSIS — I10 Essential (primary) hypertension: Secondary | ICD-10-CM | POA: Diagnosis not present

## 2016-08-27 DIAGNOSIS — G309 Alzheimer's disease, unspecified: Secondary | ICD-10-CM | POA: Diagnosis not present

## 2016-08-27 DIAGNOSIS — F419 Anxiety disorder, unspecified: Secondary | ICD-10-CM | POA: Diagnosis not present

## 2016-08-27 DIAGNOSIS — E038 Other specified hypothyroidism: Secondary | ICD-10-CM | POA: Diagnosis not present

## 2016-09-11 DIAGNOSIS — F419 Anxiety disorder, unspecified: Secondary | ICD-10-CM | POA: Diagnosis not present

## 2016-09-11 DIAGNOSIS — G309 Alzheimer's disease, unspecified: Secondary | ICD-10-CM | POA: Diagnosis not present

## 2016-10-01 DIAGNOSIS — D72829 Elevated white blood cell count, unspecified: Secondary | ICD-10-CM | POA: Diagnosis not present

## 2016-10-01 DIAGNOSIS — E785 Hyperlipidemia, unspecified: Secondary | ICD-10-CM | POA: Diagnosis not present

## 2016-10-01 DIAGNOSIS — F419 Anxiety disorder, unspecified: Secondary | ICD-10-CM | POA: Diagnosis not present

## 2016-10-01 DIAGNOSIS — E038 Other specified hypothyroidism: Secondary | ICD-10-CM | POA: Diagnosis not present

## 2016-10-01 DIAGNOSIS — G309 Alzheimer's disease, unspecified: Secondary | ICD-10-CM | POA: Diagnosis not present

## 2016-10-01 DIAGNOSIS — I10 Essential (primary) hypertension: Secondary | ICD-10-CM | POA: Diagnosis not present

## 2016-10-01 DIAGNOSIS — M84359A Stress fracture, hip, unspecified, initial encounter for fracture: Secondary | ICD-10-CM | POA: Diagnosis not present

## 2016-10-07 DIAGNOSIS — M79675 Pain in left toe(s): Secondary | ICD-10-CM | POA: Diagnosis not present

## 2016-10-07 DIAGNOSIS — B351 Tinea unguium: Secondary | ICD-10-CM | POA: Diagnosis not present

## 2016-10-07 DIAGNOSIS — M79674 Pain in right toe(s): Secondary | ICD-10-CM | POA: Diagnosis not present

## 2016-10-09 DIAGNOSIS — F419 Anxiety disorder, unspecified: Secondary | ICD-10-CM | POA: Diagnosis not present

## 2016-10-09 DIAGNOSIS — G309 Alzheimer's disease, unspecified: Secondary | ICD-10-CM | POA: Diagnosis not present

## 2016-10-22 DIAGNOSIS — F419 Anxiety disorder, unspecified: Secondary | ICD-10-CM | POA: Diagnosis not present

## 2016-10-22 DIAGNOSIS — I10 Essential (primary) hypertension: Secondary | ICD-10-CM | POA: Diagnosis not present

## 2016-10-22 DIAGNOSIS — D72829 Elevated white blood cell count, unspecified: Secondary | ICD-10-CM | POA: Diagnosis not present

## 2016-10-22 DIAGNOSIS — G309 Alzheimer's disease, unspecified: Secondary | ICD-10-CM | POA: Diagnosis not present

## 2016-10-22 DIAGNOSIS — E785 Hyperlipidemia, unspecified: Secondary | ICD-10-CM | POA: Diagnosis not present

## 2016-10-22 DIAGNOSIS — M84359A Stress fracture, hip, unspecified, initial encounter for fracture: Secondary | ICD-10-CM | POA: Diagnosis not present

## 2016-10-22 DIAGNOSIS — R21 Rash and other nonspecific skin eruption: Secondary | ICD-10-CM | POA: Diagnosis not present

## 2016-10-22 DIAGNOSIS — E038 Other specified hypothyroidism: Secondary | ICD-10-CM | POA: Diagnosis not present

## 2016-11-13 DIAGNOSIS — G309 Alzheimer's disease, unspecified: Secondary | ICD-10-CM | POA: Diagnosis not present

## 2016-11-13 DIAGNOSIS — F419 Anxiety disorder, unspecified: Secondary | ICD-10-CM | POA: Diagnosis not present

## 2016-11-25 DIAGNOSIS — M25552 Pain in left hip: Secondary | ICD-10-CM | POA: Diagnosis not present

## 2016-11-25 DIAGNOSIS — M79652 Pain in left thigh: Secondary | ICD-10-CM | POA: Diagnosis not present

## 2016-11-25 DIAGNOSIS — M79605 Pain in left leg: Secondary | ICD-10-CM | POA: Diagnosis not present

## 2016-11-26 ENCOUNTER — Emergency Department (HOSPITAL_COMMUNITY): Payer: PPO

## 2016-11-26 ENCOUNTER — Encounter (HOSPITAL_COMMUNITY): Payer: Self-pay | Admitting: *Deleted

## 2016-11-26 ENCOUNTER — Inpatient Hospital Stay (HOSPITAL_COMMUNITY)
Admission: EM | Admit: 2016-11-26 | Discharge: 2016-12-01 | DRG: 482 | Disposition: A | Discharge status: Skilled Nursing Facility | Payer: PPO | Attending: Internal Medicine | Admitting: Internal Medicine

## 2016-11-26 DIAGNOSIS — S72042A Displaced fracture of base of neck of left femur, initial encounter for closed fracture: Secondary | ICD-10-CM | POA: Diagnosis not present

## 2016-11-26 DIAGNOSIS — N898 Other specified noninflammatory disorders of vagina: Secondary | ICD-10-CM | POA: Diagnosis present

## 2016-11-26 DIAGNOSIS — Z8249 Family history of ischemic heart disease and other diseases of the circulatory system: Secondary | ICD-10-CM | POA: Diagnosis not present

## 2016-11-26 DIAGNOSIS — W1830XA Fall on same level, unspecified, initial encounter: Secondary | ICD-10-CM | POA: Diagnosis not present

## 2016-11-26 DIAGNOSIS — N760 Acute vaginitis: Secondary | ICD-10-CM | POA: Diagnosis not present

## 2016-11-26 DIAGNOSIS — E039 Hypothyroidism, unspecified: Secondary | ICD-10-CM | POA: Diagnosis not present

## 2016-11-26 DIAGNOSIS — Y92129 Unspecified place in nursing home as the place of occurrence of the external cause: Secondary | ICD-10-CM | POA: Diagnosis not present

## 2016-11-26 DIAGNOSIS — E7800 Pure hypercholesterolemia, unspecified: Secondary | ICD-10-CM | POA: Diagnosis not present

## 2016-11-26 DIAGNOSIS — Z66 Do not resuscitate: Secondary | ICD-10-CM | POA: Diagnosis not present

## 2016-11-26 DIAGNOSIS — S7292XA Unspecified fracture of left femur, initial encounter for closed fracture: Secondary | ICD-10-CM | POA: Diagnosis not present

## 2016-11-26 DIAGNOSIS — F0390 Unspecified dementia without behavioral disturbance: Secondary | ICD-10-CM | POA: Diagnosis not present

## 2016-11-26 DIAGNOSIS — I10 Essential (primary) hypertension: Secondary | ICD-10-CM | POA: Diagnosis not present

## 2016-11-26 DIAGNOSIS — H9190 Unspecified hearing loss, unspecified ear: Secondary | ICD-10-CM | POA: Diagnosis present

## 2016-11-26 DIAGNOSIS — S72009A Fracture of unspecified part of neck of unspecified femur, initial encounter for closed fracture: Secondary | ICD-10-CM

## 2016-11-26 DIAGNOSIS — Y9301 Activity, walking, marching and hiking: Secondary | ICD-10-CM | POA: Diagnosis present

## 2016-11-26 DIAGNOSIS — S299XXA Unspecified injury of thorax, initial encounter: Secondary | ICD-10-CM | POA: Diagnosis not present

## 2016-11-26 DIAGNOSIS — M25552 Pain in left hip: Secondary | ICD-10-CM | POA: Diagnosis not present

## 2016-11-26 DIAGNOSIS — E119 Type 2 diabetes mellitus without complications: Secondary | ICD-10-CM | POA: Diagnosis present

## 2016-11-26 DIAGNOSIS — E876 Hypokalemia: Secondary | ICD-10-CM | POA: Diagnosis not present

## 2016-11-26 DIAGNOSIS — S72002B Fracture of unspecified part of neck of left femur, initial encounter for open fracture type I or II: Secondary | ICD-10-CM

## 2016-11-26 DIAGNOSIS — F0280 Dementia in other diseases classified elsewhere without behavioral disturbance: Secondary | ICD-10-CM

## 2016-11-26 DIAGNOSIS — S72012A Unspecified intracapsular fracture of left femur, initial encounter for closed fracture: Principal | ICD-10-CM | POA: Diagnosis present

## 2016-11-26 DIAGNOSIS — S72002A Fracture of unspecified part of neck of left femur, initial encounter for closed fracture: Secondary | ICD-10-CM | POA: Diagnosis not present

## 2016-11-26 DIAGNOSIS — G309 Alzheimer's disease, unspecified: Secondary | ICD-10-CM | POA: Diagnosis not present

## 2016-11-26 DIAGNOSIS — M79605 Pain in left leg: Secondary | ICD-10-CM | POA: Diagnosis not present

## 2016-11-26 DIAGNOSIS — R079 Chest pain, unspecified: Secondary | ICD-10-CM | POA: Diagnosis not present

## 2016-11-26 DIAGNOSIS — S7290XA Unspecified fracture of unspecified femur, initial encounter for closed fracture: Secondary | ICD-10-CM | POA: Diagnosis present

## 2016-11-26 DIAGNOSIS — R2689 Other abnormalities of gait and mobility: Secondary | ICD-10-CM

## 2016-11-26 LAB — CBC WITH DIFFERENTIAL/PLATELET
BASOS ABS: 0 10*3/uL (ref 0.0–0.1)
BASOS PCT: 0 %
EOS ABS: 0 10*3/uL (ref 0.0–0.7)
EOS PCT: 0 %
HCT: 32.4 % — ABNORMAL LOW (ref 36.0–46.0)
Hemoglobin: 10.6 g/dL — ABNORMAL LOW (ref 12.0–15.0)
Lymphocytes Relative: 7 %
Lymphs Abs: 1.1 10*3/uL (ref 0.7–4.0)
MCH: 26 pg (ref 26.0–34.0)
MCHC: 32.7 g/dL (ref 30.0–36.0)
MCV: 79.4 fL (ref 78.0–100.0)
MONO ABS: 0.9 10*3/uL (ref 0.1–1.0)
Monocytes Relative: 6 %
Neutro Abs: 13 10*3/uL — ABNORMAL HIGH (ref 1.7–7.7)
Neutrophils Relative %: 87 %
PLATELETS: 554 10*3/uL — AB (ref 150–400)
RBC: 4.08 MIL/uL (ref 3.87–5.11)
RDW: 13.9 % (ref 11.5–15.5)
WBC: 15 10*3/uL — ABNORMAL HIGH (ref 4.0–10.5)

## 2016-11-26 LAB — CBC
HCT: 33.3 % — ABNORMAL LOW (ref 36.0–46.0)
Hemoglobin: 10.9 g/dL — ABNORMAL LOW (ref 12.0–15.0)
MCH: 26.1 pg (ref 26.0–34.0)
MCHC: 32.7 g/dL (ref 30.0–36.0)
MCV: 79.7 fL (ref 78.0–100.0)
PLATELETS: 523 10*3/uL — AB (ref 150–400)
RBC: 4.18 MIL/uL (ref 3.87–5.11)
RDW: 14 % (ref 11.5–15.5)
WBC: 11.7 10*3/uL — ABNORMAL HIGH (ref 4.0–10.5)

## 2016-11-26 LAB — BASIC METABOLIC PANEL
ANION GAP: 9 (ref 5–15)
BUN: 16 mg/dL (ref 6–20)
CALCIUM: 9 mg/dL (ref 8.9–10.3)
CO2: 24 mmol/L (ref 22–32)
CREATININE: 0.72 mg/dL (ref 0.44–1.00)
Chloride: 99 mmol/L — ABNORMAL LOW (ref 101–111)
GFR calc non Af Amer: >60 mL/min (ref >60)
GLUCOSE: 134 mg/dL — AB (ref 65–99)
Potassium: 3.7 mmol/L (ref 3.5–5.1)
Sodium: 132 mmol/L — ABNORMAL LOW (ref 135–145)

## 2016-11-26 LAB — TSH: TSH: 5.739 u[IU]/mL — ABNORMAL HIGH (ref 0.350–4.500)

## 2016-11-26 LAB — CREATININE, SERUM
Creatinine, Ser: 0.76 mg/dL (ref 0.44–1.00)
GFR calc Af Amer: >60 mL/min (ref >60)
GFR calc non Af Amer: >60 mL/min (ref >60)

## 2016-11-26 LAB — SURGICAL PCR SCREEN
MRSA, PCR: NEGATIVE
STAPHYLOCOCCUS AUREUS: NEGATIVE

## 2016-11-26 LAB — PREPARE RBC (CROSSMATCH)

## 2016-11-26 MED ORDER — ASPIRIN EC 325 MG PO TBEC: 325.0000 mg | DELAYED_RELEASE_TABLET | Freq: Every day | ORAL | Status: DC

## 2016-11-26 MED ORDER — CHLORHEXIDINE GLUCONATE 4 % EX LIQD
60.0000 mL | Freq: Once | CUTANEOUS | Status: AC
  Administered 2016-11-27: 4 via TOPICAL
  Filled 2016-11-26: qty 15

## 2016-11-26 MED ORDER — CEFAZOLIN SODIUM-DEXTROSE 2-4 GM/100ML-% IV SOLN
2.0000 g | INTRAVENOUS | Status: DC
  Filled 2016-11-26: qty 100

## 2016-11-26 MED ORDER — HYDROCODONE-ACETAMINOPHEN 5-325 MG PO TABS: 1.0000 | ORAL_TABLET | Freq: Four times a day (QID) | ORAL | Status: DC | PRN

## 2016-11-26 MED ORDER — POVIDONE-IODINE 10 % EX SWAB: 2.0000 "application " | Freq: Once | CUTANEOUS | Status: DC

## 2016-11-26 MED ORDER — SENNOSIDES-DOCUSATE SODIUM 8.6-50 MG PO TABS: 1.0000 | ORAL_TABLET | Freq: Every evening | ORAL | Status: DC | PRN

## 2016-11-26 MED ORDER — DONEPEZIL HCL 5 MG PO TABS
10.0000 mg | ORAL_TABLET | Freq: Every day | ORAL | Status: DC
  Administered 2016-11-26 – 2016-11-30 (×5): 10 mg via ORAL
  Filled 2016-11-26 (×5): qty 2

## 2016-11-26 MED ORDER — MORPHINE SULFATE (PF) 2 MG/ML IV SOLN
0.5000 mg | INTRAVENOUS | Status: DC | PRN
  Administered 2016-11-27 (×2): 0.5 mg via INTRAVENOUS
  Filled 2016-11-26 (×2): qty 1

## 2016-11-26 MED ORDER — SODIUM CHLORIDE 0.9 % IV SOLN
INTRAVENOUS | Status: DC
  Administered 2016-11-27 – 2016-11-28 (×3): via INTRAVENOUS

## 2016-11-26 MED ORDER — HEPARIN SODIUM (PORCINE) 5000 UNIT/ML IJ SOLN
5000.0000 [IU] | Freq: Three times a day (TID) | INTRAMUSCULAR | Status: DC
  Administered 2016-11-26: 5000 [IU] via SUBCUTANEOUS
  Filled 2016-11-26: qty 1

## 2016-11-26 MED ORDER — SODIUM CHLORIDE 0.9 % IV BOLUS (SEPSIS)
500.0000 mL | Freq: Once | INTRAVENOUS | Status: AC
  Administered 2016-11-26: 500 mL via INTRAVENOUS

## 2016-11-26 MED ORDER — POLYETHYLENE GLYCOL 3350 17 G PO PACK
17.0000 g | PACK | Freq: Every day | ORAL | Status: DC
  Administered 2016-11-28 – 2016-12-01 (×3): 17 g via ORAL
  Filled 2016-11-26 (×4): qty 1

## 2016-11-26 MED ORDER — LORAZEPAM 0.5 MG PO TABS
0.5000 mg | ORAL_TABLET | Freq: Two times a day (BID) | ORAL | Status: DC
  Administered 2016-11-26 – 2016-11-28 (×3): 0.5 mg via ORAL
  Filled 2016-11-26 (×3): qty 1

## 2016-11-26 MED ORDER — SODIUM CHLORIDE 0.9 % IV SOLN
Freq: Once | INTRAVENOUS | Status: AC
  Administered 2016-11-26: 19:00:00 via INTRAVENOUS

## 2016-11-26 NOTE — ED Triage Notes (Signed)
Pt comes in by EMS from [facility] for a left femur fracture. Pt was walking with her husband yesterday when they fell. They did a xray which showed a "nondisplaced transcervical subcapital fracture of the left femur." Pt has dementia. Pt alert.

## 2016-11-26 NOTE — H&P (Signed)
History and Physical    Paula [name] [id number] DOB: October 04, 1923 DOA: 11/26/2016  PCP: Wende Neighbors, MD  Patient coming from: [facility]  Chief Complaint: fall  HPI: Paula [name] is a 82 y.o. female with medical history significant of dementia, hypothyroidism who is a resident of assisted living facility. Patient was walking with her husband yesterday who is also a resident of the facility when both fell. Patient is unable to provide significant history due to dementia and no family is present in the room. Patient had x-ray done at the facility which showed nondisplaced subcapital fracture of the left femur. Currently she complained of hip pain.  ED Course: In the ER she was evaluated where she was noted to have elevated blood pressure. Lab Work is relatively unrevealing. Due to femoral neck fracture, she's been referred for admission  Review of Systems: unable to assess due to dementia   Past Medical History:  Diagnosis Date  . Diabetes mellitus   . Diagnosis unknown   . High cholesterol   . Hypertension     Past Surgical History:  Procedure Laterality Date  . EYE SURGERY    . HIP PINNING,CANNULATED Right 03/20/2016   Procedure: INSERT SCREWS RIGHT HIP-CANNULATED PINNING RIGHT HIP;  Surgeon: Carole Civil, MD;  Location: [facility];  Service: Orthopedics;  Laterality: Right;     reports that she has never smoked. She has never used smokeless tobacco. She reports that she does not drink alcohol or use drugs.  No Known Allergies  Family History  Problem Relation Age of Onset  . Hypertension Son   . Hypertension Grandchild      Prior to Admission medications   Medication Sig Start Date End Date Taking? Authorizing Provider  acetaminophen (TYLENOL) 500 MG chewable tablet Chew 500 mg by mouth 2 (two) times daily.    Yes Historical Provider, MD  donepezil (ARICEPT) 10 MG tablet Take 10 mg by mouth at bedtime.   Yes Historical Provider, MD  LORazepam (ATIVAN) 0.5 MG  tablet Take 0.5 mg by mouth 2 (two) times daily.    Yes Historical Provider, MD  polyethylene glycol (MIRALAX / GLYCOLAX) packet Take 17 g by mouth daily. 03/24/16  Yes Kathie Dike, MD  senna-docusate (SENOKOT-S) 8.6-50 MG tablet Take 1 tablet by mouth at bedtime as needed for mild constipation. 03/24/16  Yes Kathie Dike, MD  aspirin EC 325 MG EC tablet Take 1 tablet (325 mg total) by mouth daily with breakfast. 03/24/16   Kathie Dike, MD    Physical Exam: Vitals:   11/26/16 1130 11/26/16 1200 11/26/16 1230 11/26/16 1300  BP: 170/73 183/89 193/88 (!) 185/78  Pulse: 87 89 90 91  Resp: 15 14 12 14   Temp:    98.2 F (36.8 C)  TempSrc:    Oral  SpO2: 96% 98% 98% 98%  Weight:    45.2 kg (99 lb 10.4 oz)  Height:    5\' 2"  (1.575 m)      Constitutional: NAD, calm, comfortable Vitals:   11/26/16 1130 11/26/16 1200 11/26/16 1230 11/26/16 1300  BP: 170/73 183/89 193/88 (!) 185/78  Pulse: 87 89 90 91  Resp: 15 14 12 14   Temp:    98.2 F (36.8 C)  TempSrc:    Oral  SpO2: 96% 98% 98% 98%  Weight:    45.2 kg (99 lb 10.4 oz)  Height:    5\' 2"  (1.575 m)   Eyes: PERRL, lids and conjunctivae normal ENMT: Mucous membranes are moist.  Posterior pharynx clear of any exudate or lesions.Normal dentition.  Neck: normal, supple, no masses, no thyromegaly Respiratory: clear to auscultation bilaterally, no wheezing, no crackles. Normal respiratory effort. No accessory muscle use.  Cardiovascular: Regular rate and rhythm, no murmurs / rubs / gallops. No extremity edema. 2+ pedal pulses. No carotid bruits.  Abdomen: no tenderness, no masses palpated. No hepatosplenomegaly. Bowel sounds positive.  Musculoskeletal: no clubbing / cyanosis. Pain over left hip.  Skin: no rashes, lesions, ulcers. No induration Neurologic: CN 2-12 grossly intact. Sensation intact, DTR normal. Strength 5/5 in all 4.  Psychiatric: confused     Labs on Admission: I have personally reviewed following labs and imaging  studies  CBC:  Recent Labs Lab 11/26/16 0906  WBC 15.0*  NEUTROABS 13.0*  HGB 10.6*  HCT 32.4*  MCV 79.4  PLT [id number]*   Basic Metabolic Panel:  Recent Labs Lab 11/26/16 0906  NA 132*  K 3.7  CL 99*  CO2 24  GLUCOSE 134*  BUN 16  CREATININE 0.72  CALCIUM 9.0   GFR: Estimated Creatinine Clearance: 32.7 mL/min (by C-G formula based on SCr of 0.72 mg/dL). Liver Function Tests: No results for input(s): AST, ALT, ALKPHOS, BILITOT, PROT, ALBUMIN in the last 168 hours. No results for input(s): LIPASE, AMYLASE in the last 168 hours. No results for input(s): AMMONIA in the last 168 hours. Coagulation Profile: No results for input(s): INR, PROTIME in the last 168 hours. Cardiac Enzymes: No results for input(s): CKTOTAL, CKMB, CKMBINDEX, TROPONINI in the last 168 hours. BNP (last 3 results) No results for input(s): PROBNP in the last 8760 hours. HbA1C: No results for input(s): HGBA1C in the last 72 hours. CBG: No results for input(s): GLUCAP in the last 168 hours. Lipid Profile: No results for input(s): CHOL, HDL, LDLCALC, TRIG, CHOLHDL, LDLDIRECT in the last 72 hours. Thyroid Function Tests:  Recent Labs  11/26/16 1025  TSH 5.739*   Anemia Panel: No results for input(s): VITAMINB12, FOLATE, FERRITIN, TIBC, IRON, RETICCTPCT in the last 72 hours. Urine analysis:    Component Value Date/Time   COLORURINE YELLOW 03/20/2016 0120   APPEARANCEUR CLEAR 03/20/2016 0120   LABSPEC 1.020 03/20/2016 0120   PHURINE 6.0 03/20/2016 0120   GLUCOSEU NEGATIVE 03/20/2016 0120   HGBUR SMALL (A) 03/20/2016 0120   BILIRUBINUR NEGATIVE 03/20/2016 0120   KETONESUR 15 (A) 03/20/2016 0120   PROTEINUR TRACE (A) 03/20/2016 0120   UROBILINOGEN 0.2 11/14/2011 1305   NITRITE NEGATIVE 03/20/2016 0120   LEUKOCYTESUR SMALL (A) 03/20/2016 0120   Sepsis Labs: !!!!!!!!!!!!!!!!!!!!!!!!!!!!!!!!!!!!!!!!!!!! @LABRCNTIP (procalcitonin:4,lacticidven:4) )No results found for this or any previous visit  (from the past 240 hour(s)).   Radiological Exams on Admission: Dg Chest 1 View  Result Date: 11/26/2016 CLINICAL DATA:  Pain following fall EXAM: CHEST 1 VIEW COMPARISON:  March 19, 2016 FINDINGS: Lungs are clear. Heart size and pulmonary vascularity are normal. No adenopathy. There is atherosclerotic calcification in the aorta. No evident bone lesions. No pneumothorax. IMPRESSION: Aortic atherosclerosis.  No edema or consolidation. Electronically Signed   By: Lowella Grip III M.D.   On: 11/26/2016 09:41   Dg Hip Unilat W Or Wo Pelvis 2-3 Views Left  Result Date: 11/26/2016 CLINICAL DATA:  Pain following fall EXAM: DG HIP (WITH OR WITHOUT PELVIS) 2-3V LEFT COMPARISON:  None. FINDINGS: Frontal pelvis as well as lateral left hip images were obtained. There is an impacted subcapital femoral neck fracture on the left. The patient has had previous surgery on the right with screws transfixing the  proximal right femur. The tips of the screws are in the proximal right femoral head. There is an old healed fracture in the subcapital right femoral region. No other fractures. No dislocations. There is mild narrowing of each hip joint. Bones are osteoporotic. IMPRESSION: Impacted subcapital femoral neck fracture on the left. Postoperative change right. No dislocation. Bones osteoporotic. Electronically Signed   By: Lowella Grip III M.D.   On: 11/26/2016 09:40    EKG: Independently reviewed. Sinus rhythm without acute changes  Assessment/Plan Active Problems:   Essential hypertension   Hypothyroidism, acquired   Fracture of femoral neck, left, closed ([place])   Dementia   Femur fracture ([place])   1. Femur fracture. Orthopedics has been consulted.  2. Hypothyroidism. Continue Synthroid  3. Elevated blood pressure. Patient is not on any antihypertensives as an outpatient. Will use when necessary hydralazine for now. Blood pressure not be further elevated due to pain.  4. Advanced dementia.  Continue Ativan for agitation   DVT prophylaxis: heparin [place] Code Status: DNR Family Communication: tried calling grand daughter, but unable to reach her Disposition Plan: probable SNF placement on discharge Consults called: orthopedics Admission status: inpatient   [facility] MD [facility] Pager [phone]  If 7PM-7AM, please contact night-coverage [email or web address] Password [email or web address]  11/26/2016, 6:02 PM

## 2016-11-26 NOTE — ED Provider Notes (Signed)
[facility] DEPT Provider Note   CSN: [id number] Arrival date & time: 11/26/16  [id number]  By signing my name below, I, Hansel Feinstein, attest that this documentation has been prepared under the direction and in the presence of Nat Christen, MD. Electronically Signed: Hansel Feinstein, ED Scribe. 11/26/16. 8:39 AM.     History   Chief Complaint Chief Complaint  Patient presents with  . Fall   LEVEL 5 CAVEAT: HPI and ROS limited due to dementia    HPI Paula [name] is a 82 y.o. female brought in by ambulance who presents to the Emergency Department from [facility] for evaluation of injuries s/p fall that occurred yesterday. Per nursing, she was walking with her husband yesterday when they both fell. Pt had XR at the facility showing a nondisplaced transcervical subcapital fracture of the left femur. Pt currently complains of left hip pain.   The history is provided by the patient and the nursing home. History limited by: dementia. No language interpreter was used.    Past Medical History:  Diagnosis Date  . Diabetes mellitus   . Diagnosis unknown   . High cholesterol   . Hypertension     Patient Active Problem List   Diagnosis Date Noted  . Fracture of femoral neck, left, closed ([place]) 11/26/2016  . Essential hypertension 03/20/2016  . Controlled type 2 diabetes mellitus without complication, without long-term current use of insulin ([place]) 03/20/2016  . Hypothyroidism, acquired 03/20/2016  . Subcapital fracture of right hip ([place]) 03/20/2016  . CKD (chronic kidney disease) stage 3, GFR 30-59 ml/min 03/20/2016  . UTI (lower urinary tract infection) 03/20/2016  . Subcapital fracture of hip ([place]) 03/20/2016  . Hip fracture ([place]) 03/20/2016  . Uterovaginal prolapse, complete 06/06/2013    Past Surgical History:  Procedure Laterality Date  . EYE SURGERY    . HIP PINNING,CANNULATED Right 03/20/2016   Procedure: INSERT SCREWS RIGHT HIP-CANNULATED PINNING RIGHT HIP;  Surgeon: Carole Civil, MD;  Location: [facility];  Service: Orthopedics;  Laterality: Right;    OB History    No data available       Home Medications    Prior to Admission medications   Medication Sig Start Date End Date Taking? Authorizing Provider  acetaminophen (TYLENOL) 500 MG chewable tablet Chew 500 mg by mouth 2 (two) times daily.    Yes Historical Provider, MD  donepezil (ARICEPT) 10 MG tablet Take 10 mg by mouth at bedtime.   Yes Historical Provider, MD  LORazepam (ATIVAN) 0.5 MG tablet Take 0.5 mg by mouth 2 (two) times daily.    Yes Historical Provider, MD  polyethylene glycol (MIRALAX / GLYCOLAX) packet Take 17 g by mouth daily. 03/24/16  Yes Kathie Dike, MD  senna-docusate (SENOKOT-S) 8.6-50 MG tablet Take 1 tablet by mouth at bedtime as needed for mild constipation. 03/24/16  Yes Kathie Dike, MD  aspirin EC 325 MG EC tablet Take 1 tablet (325 mg total) by mouth daily with breakfast. 03/24/16   Kathie Dike, MD    Family History Family History  Problem Relation Age of Onset  . Hypertension Son   . Hypertension Grandchild     Social History Social History  Substance Use Topics  . Smoking status: Never Smoker  . Smokeless tobacco: Never Used  . Alcohol use No     Allergies   Patient has no known allergies.   Review of Systems Review of Systems  Unable to perform ROS: Dementia     Physical Exam Updated  Vital Signs BP 170/73   Pulse 87   Temp 98.7 F (37.1 C) (Rectal)   Resp 15   Ht 5\' 2"  (1.575 m)   Wt 96 lb (43.5 kg)   SpO2 96%   BMI 17.56 kg/m   Physical Exam  Constitutional: She appears well-developed and well-nourished.  HENT:  Head: Normocephalic and atraumatic.  Eyes: Conjunctivae are normal.  Neck: Neck supple.  Cardiovascular: Normal rate and regular rhythm.   Pulmonary/Chest: Effort normal and breath sounds normal.  Abdominal: Soft. Bowel sounds are normal.  Musculoskeletal: Normal range of motion.  Neurological: She is alert.  Skin: Skin  is warm and dry.  Nursing note and vitals reviewed.    ED Treatments / Results   DIAGNOSTIC STUDIES: Oxygen Saturation is 96% on RA, adequate by my interpretation.    COORDINATION OF CARE: 8:38 AM Will order labs, repeat XR, consult orthopedics.     Labs (all labs ordered are listed, but only abnormal results are displayed) Labs Reviewed  CBC WITH DIFFERENTIAL/PLATELET - Abnormal; Notable for the following:       Result Value   WBC 15.0 (*)    Hemoglobin 10.6 (*)    HCT 32.4 (*)    Platelets 554 (*)    Neutro Abs 13.0 (*)    All other components within normal limits  BASIC METABOLIC PANEL - Abnormal; Notable for the following:    Sodium 132 (*)    Chloride 99 (*)    Glucose, Bld 134 (*)    All other components within normal limits  TSH - Abnormal; Notable for the following:    TSH 5.739 (*)    All other components within normal limits    EKG  EKG Interpretation  Date/Time:  Wednesday November 26 2016 08:18:35 EST Ventricular Rate:  95 PR Interval:    QRS Duration: 96 QT Interval:  390 QTC Calculation: 491 R Axis:   -178 Text Interpretation:  Right and left arm electrode reversal, interpretation assumes no reversal Sinus rhythm Prolonged PR interval Lateral infarct, old Confirmed by Khalee Mazo  MD, Altan Kraai ([id number]) on 11/26/2016 8:26:47 AM       Radiology Dg Chest 1 View  Result Date: 11/26/2016 CLINICAL DATA:  Pain following fall EXAM: CHEST 1 VIEW COMPARISON:  March 19, 2016 FINDINGS: Lungs are clear. Heart size and pulmonary vascularity are normal. No adenopathy. There is atherosclerotic calcification in the aorta. No evident bone lesions. No pneumothorax. IMPRESSION: Aortic atherosclerosis.  No edema or consolidation. Electronically Signed   By: Lowella Grip III M.D.   On: 11/26/2016 09:41   Dg Hip Unilat W Or Wo Pelvis 2-3 Views Left  Result Date: 11/26/2016 CLINICAL DATA:  Pain following fall EXAM: DG HIP (WITH OR WITHOUT PELVIS) 2-3V LEFT COMPARISON:  None.  FINDINGS: Frontal pelvis as well as lateral left hip images were obtained. There is an impacted subcapital femoral neck fracture on the left. The patient has had previous surgery on the right with screws transfixing the proximal right femur. The tips of the screws are in the proximal right femoral head. There is an old healed fracture in the subcapital right femoral region. No other fractures. No dislocations. There is mild narrowing of each hip joint. Bones are osteoporotic. IMPRESSION: Impacted subcapital femoral neck fracture on the left. Postoperative change right. No dislocation. Bones osteoporotic. Electronically Signed   By: Lowella Grip III M.D.   On: 11/26/2016 09:40    Procedures Procedures (including critical care time)  Medications Ordered in ED  Medications  sodium chloride 0.9 % bolus 500 mL (0 mLs Intravenous Stopped 11/26/16 1038)  sodium chloride 0.9 % bolus 500 mL (500 mLs Intravenous New Bag/Given 11/26/16 1148)     Initial Impression / Assessment and Plan / ED Course  I have reviewed the triage vital signs and the nursing notes.  Pertinent labs & imaging results that were available during my care of the patient were reviewed by me and considered in my medical decision making (see chart for details).  Clinical Course    Demented patient with a subcapital femoral neck fracture. Discussed with Dr. [name]. Admit to general medicine.   Final Clinical Impressions(s) / ED Diagnoses   Final diagnoses:  Closed fracture of neck of left femur, initial encounter [facility])    New Prescriptions New Prescriptions   No medications on file   I personally performed the services described in this documentation, which was scribed in my presence. The recorded information has been reviewed and is accurate.     Nat Christen, MD 11/26/16 1213

## 2016-11-27 ENCOUNTER — Inpatient Hospital Stay (HOSPITAL_COMMUNITY): Payer: PPO | Admitting: Anesthesiology

## 2016-11-27 ENCOUNTER — Encounter (HOSPITAL_COMMUNITY)
Admission: EM | Disposition: A | Discharge status: Skilled Nursing Facility | Payer: Self-pay | Source: Home / Self Care | Attending: Internal Medicine

## 2016-11-27 ENCOUNTER — Encounter (HOSPITAL_COMMUNITY): Payer: Self-pay | Admitting: *Deleted

## 2016-11-27 ENCOUNTER — Inpatient Hospital Stay (HOSPITAL_COMMUNITY): Payer: PPO

## 2016-11-27 DIAGNOSIS — N760 Acute vaginitis: Secondary | ICD-10-CM | POA: Diagnosis not present

## 2016-11-27 DIAGNOSIS — E039 Hypothyroidism, unspecified: Secondary | ICD-10-CM | POA: Diagnosis not present

## 2016-11-27 DIAGNOSIS — S72012A Unspecified intracapsular fracture of left femur, initial encounter for closed fracture: Secondary | ICD-10-CM | POA: Diagnosis not present

## 2016-11-27 DIAGNOSIS — Z66 Do not resuscitate: Secondary | ICD-10-CM | POA: Diagnosis not present

## 2016-11-27 DIAGNOSIS — E7800 Pure hypercholesterolemia, unspecified: Secondary | ICD-10-CM | POA: Diagnosis not present

## 2016-11-27 DIAGNOSIS — H9190 Unspecified hearing loss, unspecified ear: Secondary | ICD-10-CM | POA: Diagnosis not present

## 2016-11-27 DIAGNOSIS — F0390 Unspecified dementia without behavioral disturbance: Secondary | ICD-10-CM | POA: Diagnosis not present

## 2016-11-27 DIAGNOSIS — I10 Essential (primary) hypertension: Secondary | ICD-10-CM | POA: Diagnosis not present

## 2016-11-27 DIAGNOSIS — E119 Type 2 diabetes mellitus without complications: Secondary | ICD-10-CM | POA: Diagnosis not present

## 2016-11-27 DIAGNOSIS — S72002A Fracture of unspecified part of neck of left femur, initial encounter for closed fracture: Secondary | ICD-10-CM | POA: Diagnosis not present

## 2016-11-27 DIAGNOSIS — E876 Hypokalemia: Secondary | ICD-10-CM | POA: Diagnosis not present

## 2016-11-27 DIAGNOSIS — N898 Other specified noninflammatory disorders of vagina: Secondary | ICD-10-CM | POA: Diagnosis not present

## 2016-11-27 DIAGNOSIS — Z8249 Family history of ischemic heart disease and other diseases of the circulatory system: Secondary | ICD-10-CM | POA: Diagnosis not present

## 2016-11-27 HISTORY — PX: HIP PINNING,CANNULATED: SHX1758

## 2016-11-27 LAB — CBC
HCT: 30.5 % — ABNORMAL LOW (ref 36.0–46.0)
Hemoglobin: 10.1 g/dL — ABNORMAL LOW (ref 12.0–15.0)
MCH: 26.3 pg (ref 26.0–34.0)
MCHC: 33.1 g/dL (ref 30.0–36.0)
MCV: 79.4 fL (ref 78.0–100.0)
PLATELETS: 503 10*3/uL — AB (ref 150–400)
RBC: 3.84 MIL/uL — AB (ref 3.87–5.11)
RDW: 14 % (ref 11.5–15.5)
WBC: 11.9 10*3/uL — ABNORMAL HIGH (ref 4.0–10.5)

## 2016-11-27 LAB — BASIC METABOLIC PANEL
Anion gap: 11 (ref 5–15)
BUN: 20 mg/dL (ref 6–20)
CHLORIDE: 102 mmol/L (ref 101–111)
CO2: 20 mmol/L — ABNORMAL LOW (ref 22–32)
CREATININE: 0.84 mg/dL (ref 0.44–1.00)
Calcium: 8.4 mg/dL — ABNORMAL LOW (ref 8.9–10.3)
GFR, EST NON AFRICAN AMERICAN: 59 mL/min — AB (ref >60)
Glucose, Bld: 124 mg/dL — ABNORMAL HIGH (ref 65–99)
POTASSIUM: 3.2 mmol/L — AB (ref 3.5–5.1)
SODIUM: 133 mmol/L — AB (ref 135–145)

## 2016-11-27 LAB — GLUCOSE, CAPILLARY
GLUCOSE-CAPILLARY: 83 mg/dL (ref 65–99)
Glucose-Capillary: 113 mg/dL — ABNORMAL HIGH (ref 65–99)

## 2016-11-27 SURGERY — FIXATION, FEMUR, NECK, PERCUTANEOUS, USING SCREW
Anesthesia: Spinal | Site: Hip | Laterality: Left

## 2016-11-27 MED ORDER — FENTANYL CITRATE (PF) 100 MCG/2ML IJ SOLN
INTRAMUSCULAR | Status: AC
  Filled 2016-11-27: qty 2

## 2016-11-27 MED ORDER — MIDAZOLAM HCL 2 MG/2ML IJ SOLN
INTRAMUSCULAR | Status: AC
  Filled 2016-11-27: qty 2

## 2016-11-27 MED ORDER — PROPOFOL 10 MG/ML IV BOLUS
INTRAVENOUS | Status: AC
  Filled 2016-11-27: qty 20

## 2016-11-27 MED ORDER — LACTATED RINGERS IV SOLN: INTRAVENOUS | Status: DC

## 2016-11-27 MED ORDER — CEFAZOLIN IN D5W 1 GM/50ML IV SOLN
1.0000 g | Freq: Four times a day (QID) | INTRAVENOUS | Status: AC
  Administered 2016-11-27 – 2016-11-28 (×2): 1 g via INTRAVENOUS
  Filled 2016-11-27 (×2): qty 50

## 2016-11-27 MED ORDER — CEFAZOLIN IN D5W 1 GM/50ML IV SOLN
INTRAVENOUS | Status: AC
  Filled 2016-11-27: qty 100

## 2016-11-27 MED ORDER — BUPIVACAINE IN DEXTROSE 0.75-8.25 % IT SOLN
INTRATHECAL | Status: DC | PRN
  Administered 2016-11-27: 12.5 mg via INTRATHECAL

## 2016-11-27 MED ORDER — METOPROLOL TARTRATE 25 MG PO TABS
12.5000 mg | ORAL_TABLET | Freq: Two times a day (BID) | ORAL | Status: DC
  Administered 2016-11-27 – 2016-11-29 (×4): 12.5 mg via ORAL
  Filled 2016-11-27 (×5): qty 1

## 2016-11-27 MED ORDER — METOCLOPRAMIDE HCL 10 MG PO TABS: 5.0000 mg | ORAL_TABLET | Freq: Three times a day (TID) | ORAL | Status: DC | PRN

## 2016-11-27 MED ORDER — ACETAMINOPHEN 650 MG RE SUPP: 650.0000 mg | Freq: Four times a day (QID) | RECTAL | Status: DC | PRN

## 2016-11-27 MED ORDER — PROPOFOL 500 MG/50ML IV EMUL
INTRAVENOUS | Status: DC | PRN
  Administered 2016-11-27: 15 ug/kg/min via INTRAVENOUS

## 2016-11-27 MED ORDER — SODIUM CHLORIDE 0.9 % IR SOLN
Status: DC | PRN
  Administered 2016-11-27: 1000 mL

## 2016-11-27 MED ORDER — ONDANSETRON HCL 4 MG/2ML IJ SOLN: 4.0000 mg | Freq: Four times a day (QID) | INTRAMUSCULAR | Status: DC | PRN

## 2016-11-27 MED ORDER — MIDAZOLAM HCL 2 MG/2ML IJ SOLN
1.0000 mg | INTRAMUSCULAR | Status: DC | PRN
  Administered 2016-11-27: 1 mg via INTRAVENOUS

## 2016-11-27 MED ORDER — ONDANSETRON HCL 4 MG PO TABS: 4.0000 mg | ORAL_TABLET | Freq: Four times a day (QID) | ORAL | Status: DC | PRN

## 2016-11-27 MED ORDER — BUPIVACAINE-EPINEPHRINE (PF) 0.25% -1:200000 IJ SOLN
INTRAMUSCULAR | Status: AC
  Filled 2016-11-27: qty 60

## 2016-11-27 MED ORDER — METOCLOPRAMIDE HCL 5 MG/ML IJ SOLN: 5.0000 mg | Freq: Three times a day (TID) | INTRAMUSCULAR | Status: DC | PRN

## 2016-11-27 MED ORDER — FENTANYL CITRATE (PF) 100 MCG/2ML IJ SOLN: 25.0000 ug | INTRAMUSCULAR | Status: DC | PRN

## 2016-11-27 MED ORDER — SODIUM CHLORIDE 0.9 % IJ SOLN
INTRAMUSCULAR | Status: AC
  Filled 2016-11-27: qty 10

## 2016-11-27 MED ORDER — HYDRALAZINE HCL 20 MG/ML IJ SOLN
25.0000 mg | Freq: Four times a day (QID) | INTRAMUSCULAR | Status: DC | PRN
  Administered 2016-11-27: 25 mg via INTRAVENOUS
  Filled 2016-11-27: qty 2

## 2016-11-27 MED ORDER — FENTANYL CITRATE (PF) 100 MCG/2ML IJ SOLN
INTRAMUSCULAR | Status: DC | PRN
  Administered 2016-11-27: 20 ug via INTRAVENOUS

## 2016-11-27 MED ORDER — MENTHOL 3 MG MT LOZG: 1.0000 | LOZENGE | OROMUCOSAL | Status: DC | PRN

## 2016-11-27 MED ORDER — POTASSIUM CHLORIDE 2 MEQ/ML IV SOLN: 30.0000 meq | Freq: Once | INTRAVENOUS | Status: DC

## 2016-11-27 MED ORDER — BUPIVACAINE-EPINEPHRINE (PF) 0.25% -1:200000 IJ SOLN
INTRAMUSCULAR | Status: DC | PRN
  Administered 2016-11-27: 60 mL via PERINEURAL

## 2016-11-27 MED ORDER — DEXTROSE 50 % IV SOLN
12.5000 g | Freq: Once | INTRAVENOUS | Status: AC
  Administered 2016-11-27: 12.5 g via INTRAVENOUS

## 2016-11-27 MED ORDER — PHENOL 1.4 % MT LIQD: 1.0000 | OROMUCOSAL | Status: DC | PRN

## 2016-11-27 MED ORDER — BUPIVACAINE IN DEXTROSE 0.75-8.25 % IT SOLN
INTRATHECAL | Status: AC
  Filled 2016-11-27: qty 2

## 2016-11-27 MED ORDER — ASPIRIN EC 325 MG PO TBEC
325.0000 mg | DELAYED_RELEASE_TABLET | Freq: Every day | ORAL | Status: DC
  Administered 2016-11-28 – 2016-12-01 (×3): 325 mg via ORAL
  Filled 2016-11-27 (×3): qty 1

## 2016-11-27 MED ORDER — DEXTROSE 50 % IV SOLN
INTRAVENOUS | Status: AC
  Filled 2016-11-27: qty 50

## 2016-11-27 MED ORDER — POTASSIUM CHLORIDE 10 MEQ/100ML IV SOLN
10.0000 meq | INTRAVENOUS | Status: AC
  Administered 2016-11-27 (×3): 10 meq via INTRAVENOUS
  Filled 2016-11-27 (×3): qty 100

## 2016-11-27 MED ORDER — PHENYLEPHRINE HCL 10 MG/ML IJ SOLN
INTRAMUSCULAR | Status: DC | PRN
  Administered 2016-11-27 (×3): 40 ug via INTRAVENOUS

## 2016-11-27 MED ORDER — CEFAZOLIN SODIUM-DEXTROSE 2-4 GM/100ML-% IV SOLN
2.0000 g | INTRAVENOUS | Status: AC
  Administered 2016-11-27: 2 g via INTRAVENOUS
  Filled 2016-11-27: qty 100

## 2016-11-27 MED ORDER — TRAMADOL HCL 50 MG PO TABS
50.0000 mg | ORAL_TABLET | Freq: Four times a day (QID) | ORAL | Status: DC
  Administered 2016-11-27 – 2016-12-01 (×11): 50 mg via ORAL
  Filled 2016-11-27 (×11): qty 1

## 2016-11-27 MED ORDER — ACETAMINOPHEN 325 MG PO TABS: 650.0000 mg | ORAL_TABLET | Freq: Four times a day (QID) | ORAL | Status: DC | PRN

## 2016-11-27 MED ORDER — EPHEDRINE SULFATE 50 MG/ML IJ SOLN
INTRAMUSCULAR | Status: AC
  Filled 2016-11-27: qty 1

## 2016-11-27 MED ORDER — FENTANYL CITRATE (PF) 100 MCG/2ML IJ SOLN
25.0000 ug | Freq: Once | INTRAMUSCULAR | Status: AC
  Administered 2016-11-27: 25 ug via INTRAVENOUS

## 2016-11-27 MED ORDER — POTASSIUM CHLORIDE CRYS ER 20 MEQ PO TBCR: 40.0000 meq | EXTENDED_RELEASE_TABLET | Freq: Once | ORAL | Status: DC

## 2016-11-27 SURGICAL SUPPLY — 50 items
BAG HAMPER (MISCELLANEOUS) ×3 IMPLANT
BIT DRILL 5 ACE CANN QC (BIT) ×3 IMPLANT
BLADE HEX COATED 2.75 (ELECTRODE) ×3 IMPLANT
BLADE SURG SZ10 CARB STEEL (BLADE) ×6 IMPLANT
BNDG GAUZE ELAST 4 BULKY (GAUZE/BANDAGES/DRESSINGS) ×3 IMPLANT
CHLORAPREP W/TINT 26ML (MISCELLANEOUS) ×3 IMPLANT
CLOTH BEACON ORANGE TIMEOUT ST (SAFETY) ×3 IMPLANT
COVER LIGHT HANDLE STERIS (MISCELLANEOUS) ×12 IMPLANT
COVER MAYO STAND XLG (DRAPE) IMPLANT
DECANTER SPIKE VIAL GLASS SM (MISCELLANEOUS) ×6 IMPLANT
DRAPE STERI IOBAN 125X83 (DRAPES) ×3 IMPLANT
DRESSING MEPILEX BORDER 6X8 (GAUZE/BANDAGES/DRESSINGS) ×1 IMPLANT
DRSG MEPILEX BORDER 6X8 (GAUZE/BANDAGES/DRESSINGS) ×3
GLOVE BIOGEL PI IND STRL 7.0 (GLOVE) ×2 IMPLANT
GLOVE BIOGEL PI INDICATOR 7.0 (GLOVE) ×4
GLOVE SKINSENSE NS SZ8.0 LF (GLOVE) ×2
GLOVE SKINSENSE STRL SZ8.0 LF (GLOVE) ×1 IMPLANT
GLOVE SS N UNI LF 8.5 STRL (GLOVE) ×3 IMPLANT
GOWN STRL REUS W/TWL LRG LVL3 (GOWN DISPOSABLE) ×12 IMPLANT
GOWN STRL REUS W/TWL XL LVL3 (GOWN DISPOSABLE) ×3 IMPLANT
INST SET MAJOR BONE (KITS) ×3 IMPLANT
KIT BLADEGUARD II DBL (SET/KITS/TRAYS/PACK) ×3 IMPLANT
KIT ROOM TURNOVER APOR (KITS) ×3 IMPLANT
MANIFOLD NEPTUNE II (INSTRUMENTS) ×3 IMPLANT
MARKER SKIN DUAL TIP RULER LAB (MISCELLANEOUS) ×3 IMPLANT
NEEDLE HYPO 21X1.5 SAFETY (NEEDLE) ×3 IMPLANT
NEEDLE SPNL 18GX3.5 QUINCKE PK (NEEDLE) ×3 IMPLANT
NS IRRIG 1000ML POUR BTL (IV SOLUTION) ×3 IMPLANT
PACK BASIC III (CUSTOM PROCEDURE TRAY) ×2
PACK SRG BSC III STRL LF ECLPS (CUSTOM PROCEDURE TRAY) ×1 IMPLANT
PAD ABD 5X9 TENDERSORB (GAUZE/BANDAGES/DRESSINGS) IMPLANT
PENCIL HANDSWITCHING (ELECTRODE) ×3 IMPLANT
PIN THREADED GUIDE ACE (PIN) ×9 IMPLANT
SCREW CANN 6.5 65MM (Screw) ×3 IMPLANT
SCREW CANN 6.5 70MM (Screw) ×2 IMPLANT
SCREW CANN 6.5 75MM (Screw) ×2 IMPLANT
SCREW CANN LG 6.5 FLT 70X22 (Screw) ×1 IMPLANT
SCREW CANN LG 6.5 FLT 75X22 (Screw) ×1 IMPLANT
SET BASIN LINEN APH (SET/KITS/TRAYS/PACK) ×3 IMPLANT
SPONGE LAP 18X18 X RAY DECT (DISPOSABLE) ×3 IMPLANT
STAPLER VISISTAT 35W (STAPLE) ×3 IMPLANT
SUT BRALON NAB BRD #1 30IN (SUTURE) IMPLANT
SUT MNCRL 0 VIOLET CTX 36 (SUTURE) ×1 IMPLANT
SUT MON AB 2-0 CT1 36 (SUTURE) ×3 IMPLANT
SUT MONOCRYL 0 CTX 36 (SUTURE) ×2
SYR 30ML LL (SYRINGE) ×3 IMPLANT
SYR BULB IRRIGATION 50ML (SYRINGE) ×6 IMPLANT
TOWEL OR 17X26 4PK STRL BLUE (TOWEL DISPOSABLE) ×3 IMPLANT
WASHER ACECAN 6.5 (Washer) ×9 IMPLANT
YANKAUER SUCT BULB TIP 10FT TU (MISCELLANEOUS) ×3 IMPLANT

## 2016-11-27 NOTE — Care Management Note (Signed)
Case Management Note  Patient Details  Name: Paula [name] MRN: [id number] Date of Birth: [birth year]  Subjective/Objective:                  Pt admitted with femoral fracture. Pt has dementia and is from [facility]'s memory care unit. Pt planned for surgery today.   Action/Plan: Anticipate DC to SNF or back to ALF. CSW is aware of DC plans and following to make arrangements. No CM needs anticipated.   Expected Discharge Date:     11/29/2016             Expected Discharge Plan:  [facility] (VS return to ALF)  In-House Referral:  Clinical Social Work  Discharge planning Services  CM Consult  Post Acute Care Choice:  NA Choice offered to:  NA  Status of Service:  Completed, signed off  Sherald Barge, RN 11/27/2016, 11:37 AM

## 2016-11-27 NOTE — Anesthesia Procedure Notes (Signed)
Spinal  Patient location during procedure: OR Start time: 11/27/2016 4:08 PM Staffing Resident/CRNA: Bernette Redbird J Preanesthetic Checklist Completed: patient identified, site marked, surgical consent, pre-op evaluation, timeout performed, IV checked, risks and benefits discussed and monitors and equipment checked Spinal Block Patient position: left lateral decubitus Prep: Betadine Patient monitoring: heart rate, cardiac monitor, continuous pulse ox and blood pressure Approach: midline Location: L3-4 Injection technique: single-shot Needle Needle type: Spinocan  Needle gauge: 22 G Needle length: 9 cm Assessment Sensory level: T6 Additional Notes CSF slow and clear          [id number]          11/2017

## 2016-11-27 NOTE — Brief Op Note (Signed)
11/26/2016 - 11/27/2016  5:24 PM  PATIENT:  Paula [name]  82 y.o. female  PRE-OPERATIVE DIAGNOSIS:  left femoral neck fracture  POST-OPERATIVE DIAGNOSIS:  left femoral neck fracture  Left hip was pinned with 3 cannulated screws from the [place] screw set using 3 washers with an inverted triangle configuration  PROCEDURE:  Procedure(s): LEFT HIP PINNING (Left)  SURGEON:  Surgeon(s) and Role:    * Carole Civil, MD - Primary  PHYSICIAN ASSISTANT:   ASSISTANTS: none   ANESTHESIA:   spinal  EBL:  Total I/O In: 500 [I.V.:500] Out: 650 [Urine:600; Blood:50]  BLOOD ADMINISTERED:none  DRAINS: none   LOCAL MEDICATIONS USED:  MARCAINE     SPECIMEN:  No Specimen  DISPOSITION OF SPECIMEN:  N/A  COUNTS:  YES  TOURNIQUET:  * No tourniquets in log *  DICTATION: .Dragon Dictation  PLAN OF CARE: Admit to inpatient   PATIENT DISPOSITION:  PACU - hemodynamically stable.   Delay start of Pharmacological VTE agent (>24hrs) due to surgical blood loss or risk of bleeding: yes  Surgery was done as follows patient was identified in the preop area surgical site confirmed left hip marked as such. Patient taken to surgery for spinal anesthesia. After successful spinal anesthesia she was placed on the fracture table with a well-leg padded in a well leg holder and the left leg in traction  The C-arm was brought in and the leg was internally rotated until the femoral neck was parallel with the ground and the x-ray showed a stable reduction on both views  After sterile prep and drape and timeout the surgery began with an incision over the greater trochanter. This was taken distally for approximate 5 cm. The next layer of subcutaneous tissue was divided down to fascia. The fascia was split in line with the skin incision and the vastus lateralis was split as well until we got down to bone and this was further elevated using an elevator to remove muscle tissue  A single threaded tip  guidewire was placed in the inferior neck and confirmed in position on the lateral x-ray and after adjustment it was in the center position  A multihole threaded pin guide was then placed over the guide pin and then 2 additional threaded guide pins were placed superiorly  Each pin was measured, the cortex was drilled and then each screw was placed with a washer  Final x-rays confirmed reduction and hardware position  The wound was irrigated with saline and then closed in layered fashion with #1 Surgilon 0 Monocryl 2-0 Monocryl and skin staples. We did inject 30 mL of Marcaine with epinephrine  Sterile dressing was applied  Postoperative plan Weightbearing as tolerated 28 days of aspirin Staples out on postop day 12 or 14 Follow-up visits at 2 weeks 6 weeks and 12 weeks for x-rays  [id number]

## 2016-11-27 NOTE — Progress Notes (Signed)
PROGRESS NOTE    Paula [name]  [id number] DOB: 1923-03-18 DOA: 11/26/2016 PCP: Wende Neighbors, MD    Brief Narrative:  Paula [name] is a 82 y.o. female with medical history significant of dementia, hypothyroidism who is a resident of assisted living facility. Patient was walking with her husband yesterday who is also a resident of the facility when both fell.  Patient had x-ray done at the facility which showed nondisplaced subcapital fracture of the left femur. She was brought to the ER for evaluation where she was admitted and seen by orthopedics. Dr. Koleen Nimrod performed operative management on 12/14. She will likely need skilled nursing facility placement.    Assessment & Plan:   Active Problems:   Essential hypertension   Hypothyroidism, acquired   Fracture of femoral neck, left, closed ([place])   Dementia   Femur fracture ([place])   1. Femur fracture. Orthopedics has been consulted. Patient underwent operative management on 12/14. Physical therapy consult.  2. Hypothyroidism. Continue Synthroid  3. Elevated blood pressure. Patient is not on any antihypertensives as an outpatient. Will add low-dose Lopressor. Will use when necessary hydralazine for now. Blood pressure could be further elevated due to pain.  4. Advanced dementia. Continue Ativan for agitation  5. Hypokalemia. Replace     DVT prophylaxis: Aspirin Code Status: DO NOT RESUSCITATE Family Communication: No family present Disposition Plan: Probable nursing facility placement   Consultants:   Orthopedics  Procedures:  Left hip was pinned with 3 cannulated screws from the Asnis screw set using 3 washers with an inverted triangle configuration  Antimicrobials:      Subjective: Patient is seen in PACU postoperatively. She appears comfortable  Objective: Vitals:   11/27/16 1500 11/27/16 1515 11/27/16 1530 11/27/16 1545  BP:      Pulse:      Resp: 17 17 18 16   Temp:      TempSrc:      SpO2: 98%  98% 99% 98%  Weight:      Height:        Intake/Output Summary (Last 24 hours) at 11/27/16 1817 Last data filed at 11/27/16 1715  Gross per 24 hour  Intake              500 ml  Output              650 ml  Net             -150 ml   Filed Weights   11/26/16 0816 11/26/16 1300  Weight: 43.5 kg (96 lb) 45.2 kg (99 lb 10.4 oz)    Examination:  General exam: Appears calm and comfortable  Respiratory system: Clear to auscultation. Respiratory effort normal. Cardiovascular system: S1 & S2 heard, RRR. No JVD, murmurs, rubs, gallops or clicks. No pedal edema. Gastrointestinal system: Abdomen is nondistended, soft and nontender. No organomegaly or masses felt. Normal bowel sounds heard. Central nervous system: No focal neurological deficits. Extremities: Symmetric 5 x 5 power. Skin: No rashes, lesions or ulcers Psychiatry: confused    Data Reviewed: I have personally reviewed following labs and imaging studies  CBC:  Recent Labs Lab 11/26/16 0906 11/26/16 1919 11/27/16 0543  WBC 15.0* 11.7* 11.9*  NEUTROABS 13.0*  --   --   HGB 10.6* 10.9* 10.1*  HCT 32.4* 33.3* 30.5*  MCV 79.4 79.7 79.4  PLT 554* 523* [id number]*   Basic Metabolic Panel:  Recent Labs Lab 11/26/16 0906 11/26/16 1919 11/27/16 0543  NA 132*  --  133*  K 3.7  --  3.2*  CL 99*  --  102  CO2 24  --  20*  GLUCOSE 134*  --  124*  BUN 16  --  20  CREATININE 0.72 0.76 0.84  CALCIUM 9.0  --  8.4*   GFR: Estimated Creatinine Clearance: 31.1 mL/min (by C-G formula based on SCr of 0.84 mg/dL). Liver Function Tests: No results for input(s): AST, ALT, ALKPHOS, BILITOT, PROT, ALBUMIN in the last 168 hours. No results for input(s): LIPASE, AMYLASE in the last 168 hours. No results for input(s): AMMONIA in the last 168 hours. Coagulation Profile: No results for input(s): INR, PROTIME in the last 168 hours. Cardiac Enzymes: No results for input(s): CKTOTAL, CKMB, CKMBINDEX, TROPONINI in the last 168 hours. BNP  (last 3 results) No results for input(s): PROBNP in the last 8760 hours. HbA1C: No results for input(s): HGBA1C in the last 72 hours. CBG:  Recent Labs Lab 11/27/16 1416 11/27/16 1738  GLUCAP 83 113*   Lipid Profile: No results for input(s): CHOL, HDL, LDLCALC, TRIG, CHOLHDL, LDLDIRECT in the last 72 hours. Thyroid Function Tests:  Recent Labs  11/26/16 1025  TSH 5.739*   Anemia Panel: No results for input(s): VITAMINB12, FOLATE, FERRITIN, TIBC, IRON, RETICCTPCT in the last 72 hours. Sepsis Labs: No results for input(s): PROCALCITON, LATICACIDVEN in the last 168 hours.  Recent Results (from the past 240 hour(s))  Surgical pcr screen     Status: None   Collection Time: 11/26/16  5:03 PM  Result Value Ref Range Status   MRSA, PCR NEGATIVE NEGATIVE Final   Staphylococcus aureus NEGATIVE NEGATIVE Final    Comment:        The Xpert SA Assay (FDA approved for NASAL specimens in patients over 2 years of age), is one component of a comprehensive surveillance program.  Test performance has been validated by [facility] for patients greater than or equal to 43 year old. It is not intended to diagnose infection nor to guide or monitor treatment.          Radiology Studies: Dg Chest 1 View  Result Date: 11/26/2016 CLINICAL DATA:  Pain following fall EXAM: CHEST 1 VIEW COMPARISON:  March 19, 2016 FINDINGS: Lungs are clear. Heart size and pulmonary vascularity are normal. No adenopathy. There is atherosclerotic calcification in the aorta. No evident bone lesions. No pneumothorax. IMPRESSION: Aortic atherosclerosis.  No edema or consolidation. Electronically Signed   By: Lowella Grip III M.D.   On: 11/26/2016 09:41   Dg Hip Unilat W Or Wo Pelvis 2-3 Views Left  Result Date: 11/26/2016 CLINICAL DATA:  Pain following fall EXAM: DG HIP (WITH OR WITHOUT PELVIS) 2-3V LEFT COMPARISON:  None. FINDINGS: Frontal pelvis as well as lateral left hip images were obtained. There  is an impacted subcapital femoral neck fracture on the left. The patient has had previous surgery on the right with screws transfixing the proximal right femur. The tips of the screws are in the proximal right femoral head. There is an old healed fracture in the subcapital right femoral region. No other fractures. No dislocations. There is mild narrowing of each hip joint. Bones are osteoporotic. IMPRESSION: Impacted subcapital femoral neck fracture on the left. Postoperative change right. No dislocation. Bones osteoporotic. Electronically Signed   By: Lowella Grip III M.D.   On: 11/26/2016 09:40        Scheduled Meds: . [MAR Hold] aspirin  325 mg Oral Q breakfast  . [MAR Hold] donepezil  10 mg  Oral QHS  . [MAR Hold] LORazepam  0.5 mg Oral BID  . [MAR Hold] polyethylene glycol  17 g Oral Daily   Continuous Infusions: . sodium chloride 75 mL/hr at 11/27/16 0848     LOS: 1 day    Time spent: 44mins    Deandrea Vanpelt, MD [facility] Pager [phone]  If 7PM-7AM, please contact night-coverage [email or web address] Password [facility] 11/27/2016, 6:17 PM

## 2016-11-27 NOTE — Anesthesia Postprocedure Evaluation (Signed)
Anesthesia Post Note  Patient: Paula [name]  Procedure(s) Performed: Procedure(s) (LRB): LEFT HIP PINNING (Left)  Patient location during evaluation: PACU Anesthesia Type: Spinal Level of consciousness: confused Pain management: pain level controlled Vital Signs Assessment: post-procedure vital signs reviewed and stable Respiratory status: spontaneous breathing, nonlabored ventilation and respiratory function stable Cardiovascular status: blood pressure returned to baseline and stable Postop Assessment: no signs of nausea or vomiting Anesthetic complications: no    Last Vitals:  Vitals:   11/27/16 0524 11/27/16 1329  BP: (!) 125/50   Pulse: 99 90  Resp: 16 16  Temp: 37 C 36.8 C    Last Pain:  Vitals:   11/27/16 0524  TempSrc: Oral                 Sae Handrich J

## 2016-11-27 NOTE — Op Note (Signed)
11/26/2016 - 11/27/2016  5:24 PM  PATIENT:  Paula [name]  82 y.o. female  PRE-OPERATIVE DIAGNOSIS:  left femoral neck fracture  POST-OPERATIVE DIAGNOSIS:  left femoral neck fracture  Left hip was pinned with 3 cannulated screws from the Asnis screw set using 3 washers with an inverted triangle configuration  PROCEDURE:  Procedure(s): LEFT HIP PINNING (Left)  SURGEON:  Surgeon(s) and Role:    * Carole Civil, MD - Primary  PHYSICIAN ASSISTANT:   ASSISTANTS: none   ANESTHESIA:   spinal  EBL:  Total I/O In: 500 [I.V.:500] Out: 650 [Urine:600; Blood:50]  BLOOD ADMINISTERED:none  DRAINS: none   LOCAL MEDICATIONS USED:  MARCAINE     SPECIMEN:  No Specimen  DISPOSITION OF SPECIMEN:  N/A  COUNTS:  YES  TOURNIQUET:  * No tourniquets in log *  DICTATION: .Dragon Dictation  PLAN OF CARE: Admit to inpatient   PATIENT DISPOSITION:  PACU - hemodynamically stable.   Delay start of Pharmacological VTE agent (>24hrs) due to surgical blood loss or risk of bleeding: yes  Surgery was done as follows patient was identified in the preop area surgical site confirmed left hip marked as such. Patient taken to surgery for spinal anesthesia. After successful spinal anesthesia she was placed on the fracture table with a well-leg padded in a well leg holder and the left leg in traction  The C-arm was brought in and the leg was internally rotated until the femoral neck was parallel with the ground and the x-ray showed a stable reduction on both views  After sterile prep and drape and timeout the surgery began with an incision over the greater trochanter. This was taken distally for approximate 5 cm. The next layer of subcutaneous tissue was divided down to fascia. The fascia was split in line with the skin incision and the vastus lateralis was split as well until we got down to bone and this was further elevated using an elevator to remove muscle tissue  A single threaded tip  guidewire was placed in the inferior neck and confirmed in position on the lateral x-ray and after adjustment it was in the center position  A multihole threaded pin guide was then placed over the guide pin and then 2 additional threaded guide pins were placed superiorly  Each pin was measured, the cortex was drilled and then each screw was placed with a washer  Final x-rays confirmed reduction and hardware position  The wound was irrigated with saline and then closed in layered fashion with #1 Surgilon 0 Monocryl 2-0 Monocryl and skin staples. We did inject 30 mL of Marcaine with epinephrine  Sterile dressing was applied  Postoperative plan Weightbearing as tolerated 28 days of aspirin Staples out on postop day 12 or 14 Follow-up visits at 2 weeks 6 weeks and 12 weeks for x-rays  [id number]

## 2016-11-27 NOTE — Transfer of Care (Signed)
Immediate Anesthesia Transfer of Care Note  Patient: Paula [name]  Procedure(s) Performed: Procedure(s): LEFT HIP PINNING (Left)  Patient Location: PACU  Anesthesia Type:Spinal  Level of Consciousness: patient cooperative  Airway & Oxygen Therapy: Patient Spontanous Breathing and Patient connected to face mask oxygen  Post-op Assessment: Report given to RN and Post -op Vital signs reviewed and stable  Post vital signs: Reviewed and stable  Last Vitals:  Vitals:   11/27/16 0524 11/27/16 1329  BP: (!) 125/50   Pulse: 99 90  Resp: 16 16  Temp: 37 C 36.8 C    Last Pain:  Vitals:   11/27/16 0524  TempSrc: Oral      Patients Stated Pain Goal: 5 ([id number])  Complications: No apparent anesthesia complications

## 2016-11-27 NOTE — Anesthesia Preprocedure Evaluation (Addendum)
Anesthesia Evaluation  Patient identified by MRN, date of birth, ID band Patient confused  General Assessment Comment:Follows commands very slowly.  Reviewed: Allergy & Precautions, NPO status , Patient's Chart, lab work & pertinent test results, reviewed documented beta blocker date and time , Unable to perform ROS - Chart review only  Airway Mallampati: II  TM Distance: <3 FB     Dental  (+) Poor Dentition   Pulmonary neg pulmonary ROS,    breath sounds clear to auscultation       Cardiovascular hypertension, Pt. on medications and Pt. on home beta blockers  Rhythm:Regular Rate:Normal     Neuro/Psych PSYCHIATRIC DISORDERS (dementia Ox0)    GI/Hepatic   Endo/Other  diabetes, Type 2Hypothyroidism   Renal/GU Renal disease     Musculoskeletal   Abdominal   Peds  Hematology   Anesthesia Other Findings   Reproductive/Obstetrics                            Anesthesia Physical Anesthesia Plan  ASA: III  Anesthesia Plan: Spinal   Post-op Pain Management:    Induction:   Airway Management Planned: Simple Face Mask  Additional Equipment:   Intra-op Plan:   Post-operative Plan:   Informed Consent: I have reviewed the patients History and Physical, chart, labs and discussed the procedure including the risks, benefits and alternatives for the proposed anesthesia with the patient or authorized representative who has indicated his/her understanding and acceptance.     Plan Discussed with:   Anesthesia Plan Comments: (CBG=83, 1/2 amp D50 given in preop.)       Anesthesia Quick Evaluation

## 2016-11-27 NOTE — Clinical Social Work Note (Signed)
Clinical Social Work Assessment  Patient Details  Name: Paula [name] MRN: [id number] Date of Birth: 1923/09/08  Date of referral:  11/27/16               Reason for consult:  Discharge Planning                Permission sought to share information with:  Chartered certified accountant granted to share information::  Yes, Verbal Permission Granted  Name::        Agency::  [facility]  Relationship::     Contact Information:     Housing/Transportation Living arrangements for the past 2 months:  [place] of Information:  Other (Comment Required), Facility (Granddaughter) Patient Interpreter Needed:  None Criminal Activity/Legal Involvement Pertinent to Current Situation/Hospitalization:  No - Comment as needed Significant Relationships:  Other Family Members Lives with:  Facility Resident Do you feel safe going back to the place where you live?  Yes Need for family participation in patient care:  Yes (Comment)  Care giving concerns:  Pt is from ALF. Admitted with hip fracture. May require higher level of care.    Social Worker assessment / plan:  CSW spoke with pt's granddaughter, Rhiannon on phone. She reports that she and her brother share HCPOA. Pt admitted yesterday due to hip fracture. She is a resident at [facility] on memory care unit. Pt was ambulating in hallway at facility with her husband and both fell and fractured left hip. Pt had right hip fracture earlier this year. She was able to return to [place] with in house home health. Family request to see how pt does with PT after surgery before making decision. Pt has dementia and is oriented to self only. Discussed plan with Nanine Means and they are in agreement to assess after surgery for possible return.   Employment status:  Retired Nurse, adult PT Recommendations:  Not assessed at this time Information / Referral to community resources:   Other (Comment Required) (to be determined)  Patient/Family's Response to care:  Pt's granddaughter requests to see how pt progresses after surgery to determine if higher level of care is needed.   Patient/Family's Understanding of and Emotional Response to Diagnosis, Current Treatment, and Prognosis:  Granddaughter is aware of admission diagnosis and plan for surgery this afternoon. Will continue to follow.   Emotional Assessment Appearance:  Appears stated age Attitude/Demeanor/Rapport:  Unable to Assess Affect (typically observed):  Unable to Assess Orientation:  Oriented to Self Alcohol / Substance use:  Not Applicable Psych involvement (Current and /or in the community):  No (Comment)  Discharge Needs  Concerns to be addressed:  Discharge Planning Concerns Readmission within the last 30 days:  No Current discharge risk:  Physical Impairment, Cognitively Impaired Barriers to Discharge:  Continued Medical Work up   Salome Arnt, [place] 11/27/2016, 8:36 AM [phone]

## 2016-11-27 NOTE — Consult Note (Signed)
HOSPITAL CONSULT (HIP FRACTURE )  MDM= MODERATE COMPLEXITY COMP HISTORY COMP EXAM  Patient ID: Paula [name], female   DOB: 11/23/1923, 82 y.o.   MRN: [id number]  New patient  Requested by: Dr. Pearletha Forge  Reason for: Left femoral neck fracture  Chief Complaint  Patient presents with  . Fall   Chief complaint pain left hip  Paula [name] is a 82 y.o. female.   HPI 82 year old female was walking with her husband at the nursing home and they both fell in both broke their hips. She has severe dementia and history had be taken from the medical record family members and what was reported from the nursing home.  We do note that the location of the pain is the left hip it's been present since actually December 12. It seems to be severe. We cannot tell quality it is modified by movement  Review of Systems (all) Review of Systems  Unable to perform ROS: Dementia    Past Medical History:  Diagnosis Date  . Diabetes mellitus   . Diagnosis unknown   . High cholesterol   . Hypertension      Past Surgical History:  Procedure Laterality Date  . EYE SURGERY    . HIP PINNING,CANNULATED Right 03/20/2016   Procedure: INSERT SCREWS RIGHT HIP-CANNULATED PINNING RIGHT HIP;  Surgeon: Carole Civil, MD;  Location: [facility];  Service: Orthopedics;  Laterality: Right;     Family History  Problem Relation Age of Onset  . Hypertension Son   . Hypertension Grandchild    the family history was reviewed and there was no history of osteoporotic fracture or family history of anesthesia risk factors   Social History Social History  Substance Use Topics  . Smoking status: Never Smoker  . Smokeless tobacco: Never Used  . Alcohol use No     No Known Allergies  Current Facility-Administered Medications  Medication Dose Route Frequency Provider Last Rate Last Dose  . 0.9 %  sodium chloride infusion   Intravenous Continuous Kathie Dike, MD      . aspirin EC tablet 325 mg  325 mg Oral Q  breakfast Kathie Dike, MD      . donepezil (ARICEPT) tablet 10 mg  10 mg Oral QHS Kathie Dike, MD   10 mg at 11/26/16 2200  . hydrALAZINE (APRESOLINE) injection 25 mg  25 mg Intravenous Q6H PRN Oswald Hillock, MD   25 mg at 11/27/16 0131  . HYDROcodone-acetaminophen (NORCO/VICODIN) 5-325 MG per tablet 1-2 tablet  1-2 tablet Oral Q6H PRN Kathie Dike, MD      . LORazepam (ATIVAN) tablet 0.5 mg  0.5 mg Oral BID Kathie Dike, MD   0.5 mg at 11/26/16 2200  . morphine 2 MG/ML injection 0.5 mg  0.5 mg Intravenous Q2H PRN Kathie Dike, MD   0.5 mg at 11/27/16 0542  . polyethylene glycol (MIRALAX / GLYCOLAX) packet 17 g  17 g Oral Daily Kathie Dike, MD      . potassium chloride 30 mEq in sodium chloride 0.9 % 265 mL (KCL MULTIRUN) IVPB  30 mEq Intravenous Once Carole Civil, MD      . povidone-iodine 10 % swab 2 application  2 application Topical Once Carole Civil, MD      . senna-docusate (Senokot-S) tablet 1 tablet  1 tablet Oral QHS PRN Kathie Dike, MD         Physical Exam(=30) Blood pressure (!) 125/50, pulse 99, temperature 98.6 F (37 C),  temperature source Oral, resp. rate 16, height 5\' 2"  (1.575 m), weight 99 lb 10.4 oz (45.2 kg), SpO2 97 %. Gen. Appearance Frail Peripheral vascular system normal dorsalis pedis pulses bilaterally and normal radial pulse bilaterally normal color and capillary refill in all 4 extremities Lymph nodes normal left and right groin Gait inability to walk secondary to fracture  Left Upper extremity  Inspection revealed no malalignment or asymmetry  Assessment of range of motion: Full range of motion was recorded  Assessment of stability: Elbow wrist and hand and shoulder were stable  Assessment of muscle strength and tone revealed  normal muscle tone  Skin was normal without rash lesion or ulceration  Right upper extremity  Inspection revealed no malalignment or asymmetry  Assessment of range of motion: Full range of motion was  recorded  Assessment of stability: Elbow wrist and hand and shoulder were stable  Assessment of muscle strength and tone  and normal muscle tone  Skin was normal without rash lesion or ulceration  Right Lower extremity  Inspection revealed no malalignment or asymmetry  Assessment of range of motion: Full range of motion was recorded  Assessment of stability: All joints was reduced without subluxation  Assessment of muscle strength and tone  normal muscle tone  Skin was normal without rash lesion or ulceration  Left lower extremity  Inspection revealed slight external rotation of the left lower extremity  Assessment of range of motion: Full range of motion was recorded  Assessment of stability: Hip knee and ankle were reduced without subluxation  Assessment of muscle strength and tone revealed  normal muscle tone  Skin was normal without rash lesion or ulceration   Coordination was not tested secondary to patient being drowsy and unable to cooperate with exam Deep tendon reflexes were 2+ in the upper extremities and 2+ in the lower extremities Examination of sensation by touch was normal in the upper and lower extremities  Mental status  The patient was not oriented to time or place but oriented to person  Mood and affect indeterminate due to patient being drowsy Dx:   Data Reviewed  I reviewed the images and the reports and my independent interpretation is left femoral neck fracture valgus impacted   Assessment  Left femoral neck fracture valgus impacted CBC Latest Ref Rng & Units 11/27/2016 11/26/2016 11/26/2016  WBC 4.0 - 10.5 K/uL 11.9(H) 11.7(H) 15.0(H)  Hemoglobin 12.0 - 15.0 g/dL 10.1(L) 10.9(L) 10.6(L)  Hematocrit 36.0 - 46.0 % 30.5(L) 33.3(L) 32.4(L)  Platelets 150 - 400 K/uL 503(H) 523(H) 554(H)   BMP Latest Ref Rng & Units 11/27/2016 11/26/2016 11/26/2016  Glucose 65 - 99 mg/dL 124(H) - 134(H)  BUN 6 - 20 mg/dL 20 - 16  Creatinine 0.44 - 1.00 mg/dL 0.84 0.76  0.72  Sodium 135 - 145 mmol/L 133(L) - 132(L)  Potassium 3.5 - 5.1 mmol/L 3.2(L) - 3.7  Chloride 101 - 111 mmol/L 102 - 99(L)  CO2 22 - 32 mmol/L 20(L) - 24  Calcium 8.9 - 10.3 mg/dL 8.4(L) - 9.0      Plan  Replace potassium  I was able to speak to the granddaughter who has consented to 53 and she agrees to internal fixation left hip. The patient would not do well with out treatment   Open treatment internal fixation left hip with 3 cannulated screws   Carole Civil MD

## 2016-11-28 LAB — BASIC METABOLIC PANEL
ANION GAP: 8 (ref 5–15)
BUN: 20 mg/dL (ref 6–20)
CO2: 20 mmol/L — AB (ref 22–32)
Calcium: 8.3 mg/dL — ABNORMAL LOW (ref 8.9–10.3)
Chloride: 109 mmol/L (ref 101–111)
Creatinine, Ser: 0.72 mg/dL (ref 0.44–1.00)
GLUCOSE: 113 mg/dL — AB (ref 65–99)
POTASSIUM: 3.5 mmol/L (ref 3.5–5.1)
Sodium: 137 mmol/L (ref 135–145)

## 2016-11-28 LAB — URINALYSIS, ROUTINE W REFLEX MICROSCOPIC
Bilirubin Urine: NEGATIVE
GLUCOSE, UA: NEGATIVE mg/dL
Ketones, ur: 15 mg/dL — AB
LEUKOCYTES UA: NEGATIVE
NITRITE: NEGATIVE
PH: 5.5 (ref 5.0–8.0)
Protein, ur: NEGATIVE mg/dL
SPECIFIC GRAVITY, URINE: 1.025 (ref 1.005–1.030)

## 2016-11-28 LAB — URINALYSIS, MICROSCOPIC (REFLEX)
SQUAMOUS EPITHELIAL / LPF: NONE SEEN
WBC UA: NONE SEEN WBC/hpf (ref 0–5)

## 2016-11-28 LAB — CBC
HEMATOCRIT: 28.2 % — AB (ref 36.0–46.0)
Hemoglobin: 9.1 g/dL — ABNORMAL LOW (ref 12.0–15.0)
MCH: 26.1 pg (ref 26.0–34.0)
MCHC: 32.3 g/dL (ref 30.0–36.0)
MCV: 80.8 fL (ref 78.0–100.0)
Platelets: 494 10*3/uL — ABNORMAL HIGH (ref 150–400)
RBC: 3.49 MIL/uL — AB (ref 3.87–5.11)
RDW: 14.3 % (ref 11.5–15.5)
WBC: 12.1 10*3/uL — AB (ref 4.0–10.5)

## 2016-11-28 MED ORDER — ENSURE ENLIVE PO LIQD: 237.0000 mL | Freq: Two times a day (BID) | ORAL | Status: DC

## 2016-11-28 MED ORDER — PRO-STAT SUGAR FREE PO LIQD
30.0000 mL | Freq: Two times a day (BID) | ORAL | Status: DC
  Administered 2016-11-28 – 2016-11-30 (×4): 30 mL via ORAL
  Filled 2016-11-28 (×6): qty 30

## 2016-11-28 MED ORDER — ENSURE ENLIVE PO LIQD
237.0000 mL | Freq: Three times a day (TID) | ORAL | Status: DC
  Administered 2016-11-28: 237 mL via ORAL

## 2016-11-28 MED ORDER — LORAZEPAM 0.5 MG PO TABS: 0.5000 mg | ORAL_TABLET | Freq: Two times a day (BID) | ORAL | Status: DC | PRN

## 2016-11-28 NOTE — Progress Notes (Signed)
PROGRESS NOTE    Paula [name]  [id number] DOB: Jan 03, 1923 DOA: 11/26/2016 PCP: Wende Neighbors, MD    Brief Narrative:  Paula [name] is a 82 y.o. female with medical history significant of dementia, hypothyroidism who is a resident of assisted living facility. Patient was walking with her husband yesterday who is also a resident of the facility when both fell.  Patient had x-ray done at the facility which showed nondisplaced subcapital fracture of the left femur. She was brought to the ER for evaluation where she was admitted and seen by orthopedics. Dr. Koleen Nimrod performed operative management on 12/14. She will likely need skilled nursing facility placement.    Assessment & Plan:   Active Problems:   Essential hypertension   Hypothyroidism, acquired   Fracture of femoral neck, left, closed (HCC)   Dementia   Femur fracture ([place])   Closed fracture of neck of left femur ([place])   1. Femur fracture. Orthopedics has been consulted. Patient underwent operative management on 12/14. Physical therapy consulted and she will need SNF.  2. Hypothyroidism. Continue Synthroid  3. Elevated blood pressure. Patient is not on any antihypertensives as an outpatient. Will add low-dose Lopressor. Will use when necessary hydralazine for now. Blood pressure could be further elevated due to pain.  4. Advanced dementia. Continue Ativan for agitation  5. Hypokalemia. Replace     DVT prophylaxis: Aspirin Code Status: DO NOT RESUSCITATE Family Communication: No family present Disposition Plan: discharge to SNF   Consultants:   Orthopedics  Procedures:  Left hip was pinned with 3 cannulated screws from the [place] screw set using 3 washers with an inverted triangle configuration  Antimicrobials:      Subjective: Patient is resting in bed, opens eyes to voice  Objective: Vitals:   11/28/16 0030 11/28/16 0200 11/28/16 0600 11/28/16 1400  BP: (!) 160/72 (!) 146/52 (!) 154/63 (!)  174/59  Pulse:  84 93 82  Resp:  16 17 18   Temp:  98.8 F (37.1 C) 98.7 F (37.1 C) 98.4 F (36.9 C)  TempSrc:  Axillary Axillary Axillary  SpO2:  97% 97% 99%  Weight:      Height:        Intake/Output Summary (Last 24 hours) at 11/28/16 1837 Last data filed at 11/28/16 1500  Gross per 24 hour  Intake          2031.25 ml  Output              350 ml  Net          1681.25 ml   Filed Weights   11/26/16 0816 11/26/16 1300  Weight: 43.5 kg (96 lb) 45.2 kg (99 lb 10.4 oz)    Examination:  General exam: Appears calm and comfortable  Respiratory system: Clear to auscultation. Respiratory effort normal. Cardiovascular system: S1 & S2 heard, RRR. No JVD, murmurs, rubs, gallops or clicks. No pedal edema. Gastrointestinal system: Abdomen is nondistended, soft and nontender. No organomegaly or masses felt. Normal bowel sounds heard. Central nervous system: No focal neurological deficits. Extremities: Symmetric 5 x 5 power. Skin: No rashes, lesions or ulcers Psychiatry: confused    Data Reviewed: I have personally reviewed following labs and imaging studies  CBC:  Recent Labs Lab 11/26/16 0906 11/26/16 1919 11/27/16 0543 11/28/16 0536  WBC 15.0* 11.7* 11.9* 12.1*  NEUTROABS 13.0*  --   --   --   HGB 10.6* 10.9* 10.1* 9.1*  HCT 32.4* 33.3* 30.5* 28.2*  MCV 79.4 79.7  79.4 80.8  PLT 554* 523* 503* [id number]*   Basic Metabolic Panel:  Recent Labs Lab 11/26/16 0906 11/26/16 1919 11/27/16 0543 11/28/16 0536  NA 132*  --  133* 137  K 3.7  --  3.2* 3.5  CL 99*  --  102 109  CO2 24  --  20* 20*  GLUCOSE 134*  --  124* 113*  BUN 16  --  20 20  CREATININE 0.72 0.76 0.84 0.72  CALCIUM 9.0  --  8.4* 8.3*   GFR: Estimated Creatinine Clearance: 32.7 mL/min (by C-G formula based on SCr of 0.72 mg/dL). Liver Function Tests: No results for input(s): AST, ALT, ALKPHOS, BILITOT, PROT, ALBUMIN in the last 168 hours. No results for input(s): LIPASE, AMYLASE in the last 168  hours. No results for input(s): AMMONIA in the last 168 hours. Coagulation Profile: No results for input(s): INR, PROTIME in the last 168 hours. Cardiac Enzymes: No results for input(s): CKTOTAL, CKMB, CKMBINDEX, TROPONINI in the last 168 hours. BNP (last 3 results) No results for input(s): PROBNP in the last 8760 hours. HbA1C: No results for input(s): HGBA1C in the last 72 hours. CBG:  Recent Labs Lab 11/27/16 1416 11/27/16 1738  GLUCAP 83 113*   Lipid Profile: No results for input(s): CHOL, HDL, LDLCALC, TRIG, CHOLHDL, LDLDIRECT in the last 72 hours. Thyroid Function Tests:  Recent Labs  11/26/16 1025  TSH 5.739*   Anemia Panel: No results for input(s): VITAMINB12, FOLATE, FERRITIN, TIBC, IRON, RETICCTPCT in the last 72 hours. Sepsis Labs: No results for input(s): PROCALCITON, LATICACIDVEN in the last 168 hours.  Recent Results (from the past 240 hour(s))  Surgical pcr screen     Status: None   Collection Time: 11/26/16  5:03 PM  Result Value Ref Range Status   MRSA, PCR NEGATIVE NEGATIVE Final   Staphylococcus aureus NEGATIVE NEGATIVE Final    Comment:        The Xpert SA Assay (FDA approved for NASAL specimens in patients over 55 years of age), is one component of a comprehensive surveillance program.  Test performance has been validated by [facility] for patients greater than or equal to 31 year old. It is not intended to diagnose infection nor to guide or monitor treatment.          Radiology Studies: Dg Hip Operative Unilat With Pelvis Left  Result Date: 11/28/2016 CLINICAL DATA:  Left hip fracture. EXAM: OPERATIVE left HIP (WITH PELVIS IF PERFORMED) 6 VIEWS TECHNIQUE: Fluoroscopic spot image(s) were submitted for interpretation post-operatively. FLUOROSCOPY TIME:  1 minutes 21 seconds. Radiation exposure index:  10.92 mGy. COMPARISON:  Radiographs of November 26, 2016. FINDINGS: Six intraoperative fluoroscopic images of the left hip demonstrate  surgical fixation of left femoral neck fracture. Good alignment of fracture components is noted. IMPRESSION: Status post surgical internal fixation of left femoral neck fracture. Electronically Signed   By: Marijo Conception, M.D.   On: 11/28/2016 08:13        Scheduled Meds: . aspirin  325 mg Oral Q breakfast  . donepezil  10 mg Oral QHS  . feeding supplement (ENSURE ENLIVE)  237 mL Oral TID BM  . feeding supplement (PRO-STAT SUGAR FREE 64)  30 mL Oral BID  . metoprolol tartrate  12.5 mg Oral BID  . polyethylene glycol  17 g Oral Daily  . traMADol  50 mg Oral Q6H   Continuous Infusions:    LOS: 2 days    Time spent: 16mins    Asberry Lascola,  MD [facility] Pager [phone]  If 7PM-7AM, please contact night-coverage [email or web address] Password [facility] 11/28/2016, 6:37 PM

## 2016-11-28 NOTE — NC FL2 (Signed)
[place] LEVEL OF CARE SCREENING TOOL     IDENTIFICATION  Patient Name: Paula [name] Birthdate: 07/06/1923 Sex: female Admission Date (Current Location): 11/26/2016  [facility] and [place] Number:  [employer] and Address:  [facility] [address] [place]      Provider Number: [id number]  Attending Physician Name and Address:  Kathie Dike, MD  Relative Name and Phone Number:       Current Level of Care: Hospital Recommended Level of Care: [place] Prior Approval Number:    Date Approved/Denied:   PASRR Number: [id number]  Discharge Plan: SNF    Current Diagnoses: Patient Active Problem List   Diagnosis Date Noted  . Closed fracture of neck of left femur ([place])   . Fracture of femoral neck, left, closed ([place]) 11/26/2016  . Dementia 11/26/2016  . Femur fracture ([place]) 11/26/2016  . Essential hypertension 03/20/2016  . Controlled type 2 diabetes mellitus without complication, without long-term current use of insulin ([place]) 03/20/2016  . Hypothyroidism, acquired 03/20/2016  . Subcapital fracture of right hip ([place]) 03/20/2016  . CKD (chronic kidney disease) stage 3, GFR 30-59 ml/min 03/20/2016  . UTI (lower urinary tract infection) 03/20/2016  . Subcapital fracture of hip ([place]) 03/20/2016  . Hip fracture ([place]) 03/20/2016  . Uterovaginal prolapse, complete 06/06/2013    Orientation RESPIRATION BLADDER Height & Weight     Self  Normal Incontinent Weight: 99 lb 10.4 oz (45.2 kg) Height:  5\' 2"  (157.5 cm)  BEHAVIORAL SYMPTOMS/MOOD NEUROLOGICAL BOWEL NUTRITION STATUS  Other (Comment) (Pt is on memory care unit, but is not a wanderer per facility)  (n/a) Incontinent Diet (Clear liquid. See d/c summary for updates)  AMBULATORY STATUS COMMUNICATION OF NEEDS Skin   Total Care Verbally Surgical wounds                       Personal Care Assistance Level of Assistance  Bathing, Feeding, Dressing  Bathing Assistance: Maximum assistance Feeding assistance: Maximum assistance Dressing Assistance: Maximum assistance     Functional Limitations Info  Sight, Hearing, Speech Sight Info: Adequate Hearing Info: Impaired Speech Info: Adequate    SPECIAL CARE FACTORS FREQUENCY  PT (By licensed PT)     PT Frequency: 5              Contractures      Additional Factors Info  Psychotropic Code Status Info: DNR Allergies Info: No known allergies Psychotropic Info: Ativan         Current Medications (11/28/2016):  This is the current hospital active medication list Current Facility-Administered Medications  Medication Dose Route Frequency Provider Last Rate Last Dose  . 0.9 %  sodium chloride infusion   Intravenous Continuous Kathie Dike, MD 75 mL/hr at 11/27/16 2238    . acetaminophen (TYLENOL) tablet 650 mg  650 mg Oral Q6H PRN Carole Civil, MD       Or  . acetaminophen (TYLENOL) suppository 650 mg  650 mg Rectal Q6H PRN Carole Civil, MD      . aspirin EC tablet 325 mg  325 mg Oral Q breakfast Carole Civil, MD      . donepezil (ARICEPT) tablet 10 mg  10 mg Oral QHS Kathie Dike, MD   10 mg at 11/27/16 2121  . hydrALAZINE (APRESOLINE) injection 25 mg  25 mg Intravenous Q6H PRN Oswald Hillock, MD   25 mg at 11/27/16 0131  . LORazepam (  ATIVAN) tablet 0.5 mg  0.5 mg Oral BID Kathie Dike, MD   0.5 mg at 11/27/16 2121  . menthol-cetylpyridinium (CEPACOL) lozenge 3 mg  1 lozenge Oral PRN Carole Civil, MD       Or  . phenol (CHLORASEPTIC) mouth spray 1 spray  1 spray Mouth/Throat PRN Carole Civil, MD      . metoCLOPramide (REGLAN) tablet 5-10 mg  5-10 mg Oral Q8H PRN Carole Civil, MD       Or  . metoCLOPramide (REGLAN) injection 5-10 mg  5-10 mg Intravenous Q8H PRN Carole Civil, MD      . metoprolol tartrate (LOPRESSOR) tablet 12.5 mg  12.5 mg Oral BID Kathie Dike, MD   12.5 mg at 11/27/16 2121  . morphine 2 MG/ML injection 0.5 mg   0.5 mg Intravenous Q2H PRN Kathie Dike, MD   0.5 mg at 11/27/16 0542  . ondansetron (ZOFRAN) tablet 4 mg  4 mg Oral Q6H PRN Carole Civil, MD       Or  . ondansetron [facility]) injection 4 mg  4 mg Intravenous Q6H PRN Carole Civil, MD      . polyethylene glycol (MIRALAX / GLYCOLAX) packet 17 g  17 g Oral Daily Kathie Dike, MD      . senna-docusate (Senokot-S) tablet 1 tablet  1 tablet Oral QHS PRN Kathie Dike, MD      . traMADol Veatrice Bourbon) tablet 50 mg  50 mg Oral Q6H Carole Civil, MD   50 mg at 11/28/16 0600     Discharge Medications: Please see discharge summary for a list of discharge medications.  Relevant Imaging Results:  Relevant Lab Results:   Additional Information SSN: [id number]  Salome Arnt, LCSW

## 2016-11-28 NOTE — Addendum Note (Signed)
Addendum  created 11/28/16 1039 by Charmaine [place], CRNA   Sign clinical note

## 2016-11-28 NOTE — Evaluation (Signed)
Physical Therapy Evaluation Patient Details Name: ZANI CASHIN MRN: [id number] DOB: [birth year] Today's Date: 11/28/2016   History of Present Illness  82 y.o. female with medical history significant of dementia, hypothyroidism who is a resident of assisted living facility. Patient was walking with her husband yesterday who is also a resident of the facility when both fell. Patient is unable to provide significant history due to dementia and no family is present in the room. Patient had x-ray done at the facility which showed nondisplaced subcapital fracture of the left femur. Currently she complained of hip pain.  Pt is now s/p L hip pinning on 11/27/2016, WBAT.    Clinical Impression  Pt received in bed, she is confused, and there is no family present.  Uncertain of her mobility status prior to arrival.  During PT evaluation today, she does not follow commands, and requires Total A +2 for supine<>sit.  She is greatly limited due to her cognitive status, as well as pain today.  She also required total A for sit<>stand trial.  She was not able to place any weight through either LE, therefore, she was assisted back into the bed.  She is recommended for SNF due to increased level of assistance that she is requiring.      Follow Up Recommendations SNF    Equipment Recommendations  None recommended by PT    Recommendations for Other Services       Precautions / Restrictions Precautions Precautions: Fall Restrictions Weight Bearing Restrictions: Yes LLE Weight Bearing: Weight bearing as tolerated      Mobility  Bed Mobility Overal bed mobility: Needs Assistance Bed Mobility: Supine to Sit;Sit to Supine     Supine to sit: Total assist;+2 for physical assistance;+2 for safety/equipment;HOB elevated Sit to supine: Total assist;+2 for safety/equipment;+2 for physical assistance   General bed mobility comments: supine scoot with total Assist.   Transfers Overall transfer level: Needs  assistance Equipment used: Rolling walker (2 wheeled) Transfers: Sit to/from Stand Sit to Stand: Total assist;+2 physical assistance;+2 safety/equipment         General transfer comment: Pt does not put weight on either LE with attempt to stand, therefore it was total A from PT and OT for upright standing posture.  Pt is not appropriate to transfer to a chair at this point due to cognitive status, and mobiltiy status.    Ambulation/Gait                Stairs            Wheelchair Mobility    Modified Rankin (Stroke Patients Only)       Balance Overall balance assessment: History of Falls;Needs assistance Sitting-balance support: Bilateral upper extremity supported;Feet supported Sitting balance-Leahy Scale: Poor     Standing balance support: Bilateral upper extremity supported Standing balance-Leahy Scale: Zero                               Pertinent Vitals/Pain Pain Assessment: Faces Faces Pain Scale: Hurts whole lot Pain Location: L hip Pain Descriptors / Indicators: Grimacing Pain Intervention(s): Limited activity within patient's tolerance;Monitored during session;Repositioned    Home Living Family/patient expects to be discharged to:: Private residence     Type of Home: Assisted living [facility] memory care unit)                Prior Function Level of Independence: Needs assistance  Comments: Pt is unable to give any information regarding PLOF due to cognition.      Hand Dominance        Extremity/Trunk Assessment   Upper Extremity Assessment Upper Extremity Assessment: Generalized weakness    Lower Extremity Assessment Lower Extremity Assessment: Generalized weakness       Communication      Cognition Arousal/Alertness: Awake/alert Behavior During Therapy: Agitated Overall Cognitive Status: History of cognitive impairments - at baseline (No family present to determine if this is pt's baseline. )                       General Comments      Exercises     Assessment/Plan    PT Assessment Patient needs continued PT services  PT Problem List Decreased strength;Decreased range of motion;Decreased activity tolerance;Decreased balance;Decreased mobility;Decreased cognition;Decreased knowledge of use of DME;Decreased safety awareness;Decreased knowledge of precautions;Decreased skin integrity;Pain          PT Treatment Interventions DME instruction;Gait training;Functional mobility training;Therapeutic activities;Therapeutic exercise;Balance training;Patient/family education    PT Goals (Current goals can be found in the Care Plan section)  Acute Rehab PT Goals PT Goal Formulation: Patient unable to participate in goal setting Time For Goal Achievement: 12/05/16 Potential to Achieve Goals: Poor    Frequency Min 5X/week   Barriers to discharge        Co-evaluation PT/OT/SLP Co-Evaluation/Treatment: Yes Reason for Co-Treatment: Necessary to address cognition/behavior during functional activity;For patient/therapist safety;Other (comment) (cognition) PT goals addressed during session: Mobility/safety with mobility;Balance         End of Session Equipment Utilized During Treatment: Gait belt Activity Tolerance: Other (comment);Patient limited by pain (Cognitive status is limiting pt's mobility and ability to follow commands) Patient left: in bed;with call bell/phone within reach;with bed alarm set Nurse Communication: Mobility status;Need for lift equipment (Maxi move for transfers out of the bed)    Functional Assessment Tool Used: [place] "6-clicks"  Functional Limitation: Mobility: Walking and moving around Mobility: Walking and Moving Around Current Status [phone]): At least 80 percent but less than 100 percent impaired, limited or restricted Mobility: Walking and Moving Around Goal Status [phone]): At least 60 percent but less than 80 percent  impaired, limited or restricted    Time: 0900-0920 PT Time Calculation (min) (ACUTE ONLY): 20 min   Charges:   PT Evaluation $PT Eval Low Complexity: 1 Procedure     PT G Codes:   PT G-Codes **NOT FOR INPATIENT CLASS** Functional Assessment Tool Used: [employer] "6-clicks"  Functional Limitation: Mobility: Walking and moving around Mobility: Walking and Moving Around Current Status [phone]): At least 80 percent but less than 100 percent impaired, limited or restricted Mobility: Walking and Moving Around Goal Status [phone]): At least 60 percent but less than 80 percent impaired, limited or restricted    Beth Milliana Reddoch, PT, DPT X: [phone]

## 2016-11-28 NOTE — Progress Notes (Signed)
Patient ID: Paula [name], female   DOB: [birth year], 82 y.o.   MRN: [id number] Postop day 1 left cannulated hip pinning doing well the most part  She's completely knocked out.  CBC Latest Ref Rng & Units 11/28/2016 11/27/2016 11/26/2016  WBC 4.0 - 10.5 K/uL 12.1(H) 11.9(H) 11.7(H)  Hemoglobin 12.0 - 15.0 g/dL 9.1(L) 10.1(L) 10.9(L)  Hematocrit 36.0 - 46.0 % 28.2(L) 30.5(L) 33.3(L)  Platelets 150 - 400 K/uL 494(H) 503(H) 523(H)   BMP Latest Ref Rng & Units 11/28/2016 11/27/2016 11/26/2016  Glucose 65 - 99 mg/dL 113(H) 124(H) -  BUN 6 - 20 mg/dL 20 20 -  Creatinine 0.44 - 1.00 mg/dL 0.72 0.84 0.76  Sodium 135 - 145 mmol/L 137 133(L) -  Potassium 3.5 - 5.1 mmol/L 3.5 3.2(L) -  Chloride 101 - 111 mmol/L 109 102 -  CO2 22 - 32 mmol/L 20(L) 20(L) -  Calcium 8.9 - 10.3 mg/dL 8.3(L) 8.4(L) -    Weightbearing as tolerated No hip precautions Staples out postop day 12-14 at the nursing home office will be closed X-rays postop day 14, week #6, week #12 Follow-up 3 weeks

## 2016-11-28 NOTE — Clinical Social Work Placement (Signed)
Notified [facility] that pt will require SNF and also notified [facility] ([employer]) of probable weekend d/c to [facility].   CLINICAL SOCIAL WORK PLACEMENT  NOTE  Date:  11/28/2016  Patient Details  Name: Paula [name] MRN: [id number] Date of Birth: 01-May-1923  Clinical Social Work is seeking post-discharge placement for this patient at the [place] level of care (*CSW will initial, date and re-position this form in  chart as items are completed):  Yes   Patient/family provided with [place] Work Department's list of facilities offering this level of care within the geographic area requested by the patient (or if unable, by the patient's family).  Yes   Patient/family informed of their freedom to choose among providers that offer the needed level of care, that participate in Medicare, Medicaid or managed care program needed by the patient, have an available bed and are willing to accept the patient.  Yes   Patient/family informed of [facility]'s ownership interest in [facility] and [facility], as well as of the fact that they are under no obligation to receive care at these facilities.  PASRR submitted to EDS on 11/28/16     PASRR number received on 11/28/16     Existing PASRR number confirmed on       FL2 transmitted to all facilities in geographic area requested by pt/family on 11/28/16     FL2 transmitted to all facilities within larger geographic area on       Patient informed that his/her managed care company has contracts with or will negotiate with certain facilities, including the following:        Yes   Patient/family informed of bed offers received.  Patient chooses bed at [facility]     Physician recommends and patient chooses bed at      Patient to be transferred to [facility] on  .  Patient to be transferred to facility by       Patient family notified on   of transfer.  Name of family  member notified:        PHYSICIAN       Additional Comment:    _______________________________________________ Salome Arnt, [place] 11/28/2016, 11:02 AM [phone]

## 2016-11-28 NOTE — Evaluation (Signed)
Occupational Therapy Evaluation Patient Details Name: Paula [name] MRN: [id number] DOB: [birth year] Today's Date: 11/28/2016    History of Present Illness 82 y.o. female with medical history significant of dementia, hypothyroidism who is a resident of assisted living facility. Patient was walking with her husband yesterday who is also a resident of the facility when both fell. Patient is unable to provide significant history due to dementia and no family is present in the room. Patient had x-ray done at the facility which showed nondisplaced subcapital fracture of the left femur. Currently she complained of hip pain.  Pt is now s/p L hip pinning on 11/27/2016, WBAT.     Clinical Impression   Pt awake, alert, confused this am. No family present to provide PLOF. Pt is resident at [facility], in memory care unit. Pt requires total assist +2 for bed mobility and sit to stand transfer trial. Pt did not put any weight through either LE during trial. Pt agitated during evaluation, and was not able to follow commands.  Recommend SNF on discharge due to increased need for assistance during ADL tasks, functional mobility and transfer tasks. All further OT needs can be met at next venue of care.     Follow Up Recommendations  SNF;Supervision/Assistance - 24 hour    Equipment Recommendations  None recommended by OT       Precautions / Restrictions Precautions Precautions: Fall Restrictions Weight Bearing Restrictions: Yes LLE Weight Bearing: Weight bearing as tolerated      Mobility Bed Mobility Overal bed mobility: Needs Assistance Bed Mobility: Supine to Sit;Sit to Supine     Supine to sit: Total assist;+2 for physical assistance;+2 for safety/equipment;HOB elevated Sit to supine: Total assist;+2 for safety/equipment;+2 for physical assistance   General bed mobility comments: supine scoot with total Assist.   Transfers Overall transfer level: Needs assistance Equipment used:  Rolling walker (2 wheeled) Transfers: Sit to/from Stand Sit to Stand: Total assist;+2 physical assistance;+2 safety/equipment         General transfer comment: Pt does not put weight on either LE with attempt to stand, therefore it was total A from PT and OT for upright standing posture.  Pt is not appropriate to transfer to a chair at this point due to cognitive status, and mobility status.           ADL Overall ADL's : Needs assistance/impaired                                       General ADL Comments: Pt is currently max-total assistance for ADL completion               Pertinent Vitals/Pain Pain Assessment: Faces Faces Pain Scale: Hurts whole lot Pain Location: L hip Pain Descriptors / Indicators: Grimacing Pain Intervention(s): Limited activity within patient's tolerance;Monitored during session;Repositioned     Hand Dominance     Extremity/Trunk Assessment Upper Extremity Assessment Upper Extremity Assessment: Generalized weakness   Lower Extremity Assessment Lower Extremity Assessment: Defer to PT evaluation          Cognition Arousal/Alertness: Awake/alert Behavior During Therapy: Agitated Overall Cognitive Status: History of cognitive impairments - at baseline (No family present to provide PLOF)                                Home Living Family/patient expects to  be discharged to:: Private residence     Type of Home: Assisted living ([facility])                                  Prior Functioning/Environment Level of Independence: Needs assistance        Comments: Pt is unable to give any information regarding PLOF due to cognition.         OT Problem List: Pain;Decreased cognition                    Co-evaluation PT/OT/SLP Co-Evaluation/Treatment: Yes Reason for Co-Treatment: Necessary to address cognition/behavior during functional activity;For patient/therapist safety;Other  (comment) (cognition) PT goals addressed during session: Mobility/safety with mobility;Balance OT goals addressed during session: ADL's and self-care;Strengthening/ROM      End of Session Equipment Utilized During Treatment: Gait belt;Rolling walker  Activity Tolerance: Patient limited by pain (limited by cognition) Patient left: in bed;with call bell/phone within reach;with bed alarm set   Time: 0900-0920 OT Time Calculation (min): 20 min Charges:  OT General Charges $OT Visit: 1 Procedure OT Evaluation $OT Eval Moderate Complexity: 1 Procedure Guadelupe Sabin, OTR/L  [phone] 11/28/2016, 10:52 AM

## 2016-11-28 NOTE — Anesthesia Postprocedure Evaluation (Signed)
Anesthesia Post Note  Patient: Paula [name]  Procedure(s) Performed: Procedure(s) (LRB): LEFT HIP PINNING (Left)  Patient location during evaluation: Nursing Unit Anesthesia Type: Spinal Level of consciousness: confused Pain management: pain level controlled Vital Signs Assessment: post-procedure vital signs reviewed and stable Respiratory status: spontaneous breathing, nonlabored ventilation and respiratory function stable Cardiovascular status: blood pressure returned to baseline Postop Assessment: no signs of nausea or vomiting Anesthetic complications: no    Last Vitals:  Vitals:   11/28/16 0200 11/28/16 0600  BP: (!) 146/52 (!) 154/63  Pulse: 84 93  Resp: 16 17  Temp: 37.1 C 37.1 C    Last Pain:  Vitals:   11/28/16 0600  TempSrc: Axillary                 [name]

## 2016-11-29 DIAGNOSIS — N760 Acute vaginitis: Secondary | ICD-10-CM

## 2016-11-29 DIAGNOSIS — N898 Other specified noninflammatory disorders of vagina: Secondary | ICD-10-CM | POA: Diagnosis not present

## 2016-11-29 LAB — BASIC METABOLIC PANEL
ANION GAP: 8 (ref 5–15)
BUN: 27 mg/dL — ABNORMAL HIGH (ref 6–20)
CALCIUM: 8.7 mg/dL — AB (ref 8.9–10.3)
CHLORIDE: 106 mmol/L (ref 101–111)
CO2: 22 mmol/L (ref 22–32)
CREATININE: 0.68 mg/dL (ref 0.44–1.00)
GFR calc non Af Amer: >60 mL/min (ref >60)
Glucose, Bld: 134 mg/dL — ABNORMAL HIGH (ref 65–99)
Potassium: 3.4 mmol/L — ABNORMAL LOW (ref 3.5–5.1)
SODIUM: 136 mmol/L (ref 135–145)

## 2016-11-29 LAB — CBC
HCT: 31.2 % — ABNORMAL LOW (ref 36.0–46.0)
HEMOGLOBIN: 10.1 g/dL — AB (ref 12.0–15.0)
MCH: 26.2 pg (ref 26.0–34.0)
MCHC: 32.4 g/dL (ref 30.0–36.0)
MCV: 81 fL (ref 78.0–100.0)
Platelets: 489 10*3/uL — ABNORMAL HIGH (ref 150–400)
RBC: 3.85 MIL/uL — ABNORMAL LOW (ref 3.87–5.11)
RDW: 14.4 % (ref 11.5–15.5)
WBC: 14.5 10*3/uL — AB (ref 4.0–10.5)

## 2016-11-29 MED ORDER — POTASSIUM CHLORIDE IN NACL 20-0.9 MEQ/L-% IV SOLN
INTRAVENOUS | Status: DC
  Administered 2016-11-29 – 2016-11-30 (×2): via INTRAVENOUS
  Administered 2016-11-30 – 2016-12-01 (×2): 75 mL/h via INTRAVENOUS

## 2016-11-29 MED ORDER — METOPROLOL TARTRATE 25 MG PO TABS
25.0000 mg | ORAL_TABLET | Freq: Two times a day (BID) | ORAL | Status: DC
  Administered 2016-11-29: 25 mg via ORAL
  Filled 2016-11-29 (×2): qty 1

## 2016-11-29 MED ORDER — HALOPERIDOL LACTATE 5 MG/ML IJ SOLN: 2.0000 mg | Freq: Two times a day (BID) | INTRAMUSCULAR | Status: DC | PRN

## 2016-11-29 MED ORDER — SODIUM CHLORIDE 0.9 % IV SOLN: INTRAVENOUS | Status: DC

## 2016-11-29 MED ORDER — METRONIDAZOLE 0.75 % VA GEL
1.0000 | VAGINAL | Status: DC
  Administered 2016-11-29: 1 via VAGINAL
  Filled 2016-11-29: qty 70

## 2016-11-29 NOTE — Progress Notes (Signed)
PROGRESS NOTE    Paula [name]  [id number] DOB: [birth year] DOA: 11/26/2016 PCP: Wende Neighbors, MD    Brief Narrative:  Paula [name] is a 82 y.o. female with medical history significant of dementia, hypothyroidism who is a resident of assisted living facility. Patient was walking with her husband yesterday who is also a resident of the facility when both fell.  Patient had x-ray done at the facility which showed nondisplaced subcapital fracture of the left femur. She was brought to the ER for evaluation where she was admitted and seen by orthopedics. Dr. Koleen Nimrod performed operative management on 12/14. She will likely need skilled nursing facility placement.    Assessment & Plan:   Active Problems:   Essential hypertension   Hypothyroidism, acquired   Fracture of femoral neck, left, closed (HCC)   Dementia   Femur fracture ([place])   Closed fracture of neck of left femur ([place])   1. Femur fracture. Orthopedics has been consulted. Patient underwent operative management on 12/14. Physical therapy consulted and she will need SNF.  2. Hypothyroidism. Continue Synthroid  3. Elevated blood pressure. Patient is not on any antihypertensives as an outpatient. She has been started on low dose lopressor, but often refused to take medications. Will use when necessary hydralazine for now.   4. Advanced dementia. Continue Ativan for agitation  5. Hypokalemia. Replace  6. Vaginal discharge. Noted by staff to have vaginal, malodorous discharge. Seen by GYN who noted that patient had a retained pessary. This was removed and she has been started on metrogel.     DVT prophylaxis: Aspirin Code Status: DO NOT RESUSCITATE Family Communication: No family present Disposition Plan: discharge to SNF   Consultants:   Orthopedics  Procedures:  Left hip was pinned with 3 cannulated screws from the [place] screw set using 3 washers with an inverted triangle configuration  Antimicrobials:       Subjective: Patient wakes up to voice. Pleasant, confused.  Objective: Vitals:   11/28/16 1400 11/28/16 2115 11/29/16 0553 11/29/16 1526  BP: (!) 174/59 (!) 176/66 (!) 178/72 (!) 185/74  Pulse: 82 88 90 95  Resp: 18 18 (!) 22 20  Temp: 98.4 F (36.9 C) 97.4 F (36.3 C) 98.9 F (37.2 C) 98.6 F (37 C)  TempSrc: Axillary Axillary Axillary Axillary  SpO2: 99% 98% 97% 98%  Weight:      Height:        Intake/Output Summary (Last 24 hours) at 11/29/16 1808 Last data filed at 11/29/16 1700  Gross per 24 hour  Intake           288.75 ml  Output                0 ml  Net           288.75 ml   Filed Weights   11/26/16 0816 11/26/16 1300  Weight: 43.5 kg (96 lb) 45.2 kg (99 lb 10.4 oz)    Examination:  General exam: Appears calm and comfortable  Respiratory system: CTA B. Respiratory effort normal. Cardiovascular system: S1 & S2 heard, RRR. No JVD, murmurs, rubs, gallops or clicks. No pedal edema. Gastrointestinal system: Abdomen is nondistended, soft and nontender. No organomegaly or masses felt. Normal bowel sounds heard. Central nervous system: No focal neurological deficits. Extremities: Symmetric 5 x 5 power. Skin: No rashes, lesions or ulcers Psychiatry: confused    Data Reviewed: I have personally reviewed following labs and imaging studies  CBC:  Recent Labs Lab 11/26/16 0906  11/26/16 1919 11/27/16 0543 11/28/16 0536 11/29/16 0757  WBC 15.0* 11.7* 11.9* 12.1* 14.5*  NEUTROABS 13.0*  --   --   --   --   HGB 10.6* 10.9* 10.1* 9.1* 10.1*  HCT 32.4* 33.3* 30.5* 28.2* 31.2*  MCV 79.4 79.7 79.4 80.8 81.0  PLT 554* 523* 503* 494* 0000000*   Basic Metabolic Panel:  Recent Labs Lab 11/26/16 0906 11/26/16 1919 11/27/16 0543 11/28/16 0536 11/29/16 0757  NA 132*  --  133* 137 136  K 3.7  --  3.2* 3.5 3.4*  CL 99*  --  102 109 106  CO2 24  --  20* 20* 22  GLUCOSE 134*  --  124* 113* 134*  BUN 16  --  20 20 27*  CREATININE 0.72 0.76 0.84 0.72 0.68   CALCIUM 9.0  --  8.4* 8.3* 8.7*   GFR: Estimated Creatinine Clearance: 32.7 mL/min (by C-G formula based on SCr of 0.68 mg/dL). Liver Function Tests: No results for input(s): AST, ALT, ALKPHOS, BILITOT, PROT, ALBUMIN in the last 168 hours. No results for input(s): LIPASE, AMYLASE in the last 168 hours. No results for input(s): AMMONIA in the last 168 hours. Coagulation Profile: No results for input(s): INR, PROTIME in the last 168 hours. Cardiac Enzymes: No results for input(s): CKTOTAL, CKMB, CKMBINDEX, TROPONINI in the last 168 hours. BNP (last 3 results) No results for input(s): PROBNP in the last 8760 hours. HbA1C: No results for input(s): HGBA1C in the last 72 hours. CBG:  Recent Labs Lab 11/27/16 1416 11/27/16 1738  GLUCAP 83 113*   Lipid Profile: No results for input(s): CHOL, HDL, LDLCALC, TRIG, CHOLHDL, LDLDIRECT in the last 72 hours. Thyroid Function Tests: No results for input(s): TSH, T4TOTAL, FREET4, T3FREE, THYROIDAB in the last 72 hours. Anemia Panel: No results for input(s): VITAMINB12, FOLATE, FERRITIN, TIBC, IRON, RETICCTPCT in the last 72 hours. Sepsis Labs: No results for input(s): PROCALCITON, LATICACIDVEN in the last 168 hours.  Recent Results (from the past 240 hour(s))  Surgical pcr screen     Status: None   Collection Time: 11/26/16  5:03 PM  Result Value Ref Range Status   MRSA, PCR NEGATIVE NEGATIVE Final   Staphylococcus aureus NEGATIVE NEGATIVE Final    Comment:        The Xpert SA Assay (FDA approved for NASAL specimens in patients over 17 years of age), is one component of a comprehensive surveillance program.  Test performance has been validated by [facility] for patients greater than or equal to 73 year old. It is not intended to diagnose infection nor to guide or monitor treatment.          Radiology Studies: No results found.      Scheduled Meds: . aspirin  325 mg Oral Q breakfast  . donepezil  10 mg Oral QHS  .  feeding supplement (ENSURE ENLIVE)  237 mL Oral TID BM  . feeding supplement (PRO-STAT SUGAR FREE 64)  30 mL Oral BID  . metoprolol tartrate  25 mg Oral BID  . metroNIDAZOLE  1 Applicatorful Vaginal [id number]  . polyethylene glycol  17 g Oral Daily  . traMADol  50 mg Oral Q6H   Continuous Infusions: . 0.9 % NaCl with KCl 20 mEq / L 75 mL/hr at 11/29/16 1221     LOS: 3 days    Time spent: 75mins    MEMON,JEHANZEB, MD [facility] Pager [phone]  If 7PM-7AM, please contact night-coverage [email or web address] Password [email or web address] 11/29/2016, 6:08 PM

## 2016-11-29 NOTE — Progress Notes (Signed)
Patient uncooperative this morning.  Spitting food and medications out.  Will not open mouth at times to take in PO.  Attempted to swab mouth as well, but pushes away.

## 2016-11-29 NOTE — Final Consult Note (Signed)
Reason for Consult:vaginal discharge Referring Physician: [name] MD  Paula [name] is an 82 y.o. female. Noted to have a vaginal odor by support staff Pertinent Gynecological History: Menses: post-menopausal Bleeding:  Contraception: post menopausal status DES exposure:  Blood transfusions:  Sexually transmitted diseases:  Previous GYN Procedures: pessary check 2014 [place] Last mammogram:  Date:  Last pap:  Date:  OB History: G P   Menstrual History: Menarche age:  No LMP recorded. Patient is postmenopausal.    Past Medical History:  Diagnosis Date  . Diabetes mellitus   . Diagnosis unknown   . High cholesterol   . Hypertension     Past Surgical History:  Procedure Laterality Date  . EYE SURGERY    . HIP PINNING,CANNULATED Right 03/20/2016   Procedure: INSERT SCREWS RIGHT HIP-CANNULATED PINNING RIGHT HIP;  Surgeon: Carole Civil, MD;  Location: [facility];  Service: Orthopedics;  Laterality: Right;    Family History  Problem Relation Age of Onset  . Hypertension Son   . Hypertension Grandchild     Social History:  reports that she has never smoked. She has never used smokeless tobacco. She reports that she does not drink alcohol or use drugs.  Allergies: No Known Allergies  Medications: I have reviewed the patient's current medications.  ROS  Blood pressure (!) 178/72, pulse 90, temperature 98.9 F (37.2 C), temperature source Axillary, resp. rate (!) 22, height _0  (1.575 m), weight 45.2 kg (99 lb 10.4 oz), SpO2 97 %. Physical Exam  Genitourinary: Vaginal discharge found.  Genitourinary Comments: Digital exam with RN Megan to assist, identified pessary is in place in vagina, and after obtaining a clamp, were able to extract the pessary  Skin: Skin is warm and dry.    Results for orders placed or performed during the hospital encounter of 11/26/16 (from the past 48 hour(s))  Glucose, capillary     Status: None   Collection Time: 11/27/16  2:16 PM   Result Value Ref Range   Glucose-Capillary 83 65 - 99 mg/dL  Glucose, capillary     Status: Abnormal   Collection Time: 11/27/16  5:38 PM  Result Value Ref Range   Glucose-Capillary 113 (H) 65 - 99 mg/dL  CBC     Status: Abnormal   Collection Time: 11/28/16  5:36 AM  Result Value Ref Range   WBC 12.1 (H) 4.0 - 10.5 K/uL   RBC 3.49 (L) 3.87 - 5.11 MIL/uL   Hemoglobin 9.1 (L) 12.0 - 15.0 g/dL   HCT 28.2 (L) 36.0 - 46.0 %   MCV 80.8 78.0 - 100.0 fL   MCH 26.1 26.0 - 34.0 pg   MCHC 32.3 30.0 - 36.0 g/dL   RDW 14.3 11.5 - 15.5 %   Platelets 494 (H) 150 - 400 K/uL  Basic metabolic panel     Status: Abnormal   Collection Time: 11/28/16  5:36 AM  Result Value Ref Range   Sodium 137 135 - 145 mmol/L   Potassium 3.5 3.5 - 5.1 mmol/L   Chloride 109 101 - 111 mmol/L   CO2 20 (L) 22 - 32 mmol/L   Glucose, Bld 113 (H) 65 - 99 mg/dL   BUN 20 6 - 20 mg/dL   Creatinine, Ser 0.72 0.44 - 1.00 mg/dL   Calcium 8.3 (L) 8.9 - 10.3 mg/dL   GFR calc non Af Amer >60 >60 mL/min   GFR calc Af Amer >60 >60 mL/min    Comment: (NOTE) The eGFR has been calculated  using the CKD EPI equation. This calculation has not been validated in all clinical situations. eGFR's persistently <60 mL/min signify possible Chronic Kidney Disease.    Anion gap 8 5 - 15  Urinalysis, Routine w reflex microscopic     Status: Abnormal   Collection Time: 11/28/16  5:55 AM  Result Value Ref Range   Color, Urine YELLOW YELLOW   APPearance CLEAR CLEAR   Specific Gravity, Urine 1.025 1.005 - 1.030   pH 5.5 5.0 - 8.0   Glucose, UA NEGATIVE NEGATIVE mg/dL   Hgb urine dipstick TRACE (A) NEGATIVE   Bilirubin Urine NEGATIVE NEGATIVE   Ketones, ur 15 (A) NEGATIVE mg/dL   Protein, ur NEGATIVE NEGATIVE mg/dL   Nitrite NEGATIVE NEGATIVE   Leukocytes, UA NEGATIVE NEGATIVE  Urinalysis, Microscopic (reflex)     Status: Abnormal   Collection Time: 11/28/16  5:55 AM  Result Value Ref Range   RBC / HPF 0-5 0 - 5 RBC/hpf   WBC, UA  NONE SEEN 0 - 5 WBC/hpf   Bacteria, UA MANY (A) NONE SEEN   Squamous Epithelial / LPF NONE SEEN NONE SEEN  CBC     Status: Abnormal   Collection Time: 11/29/16  7:57 AM  Result Value Ref Range   WBC 14.5 (H) 4.0 - 10.5 K/uL   RBC 3.85 (L) 3.87 - 5.11 MIL/uL   Hemoglobin 10.1 (L) 12.0 - 15.0 g/dL   HCT 31.2 (L) 36.0 - 46.0 %   MCV 81.0 78.0 - 100.0 fL   MCH 26.2 26.0 - 34.0 pg   MCHC 32.4 30.0 - 36.0 g/dL   RDW 14.4 11.5 - 15.5 %   Platelets 489 (H) 150 - 400 K/uL  Basic metabolic panel     Status: Abnormal   Collection Time: 11/29/16  7:57 AM  Result Value Ref Range   Sodium 136 135 - 145 mmol/L   Potassium 3.4 (L) 3.5 - 5.1 mmol/L   Chloride 106 101 - 111 mmol/L   CO2 22 22 - 32 mmol/L   Glucose, Bld 134 (H) 65 - 99 mg/dL   BUN 27 (H) 6 - 20 mg/dL   Creatinine, Ser 0.68 0.44 - 1.00 mg/dL   Calcium 8.7 (L) 8.9 - 10.3 mg/dL   GFR calc non Af Amer >60 >60 mL/min   GFR calc Af Amer >60 >60 mL/min    Comment: (NOTE) The eGFR has been calculated using the CKD EPI equation. This calculation has not been validated in all clinical situations. eGFR's persistently <60 mL/min signify possible Chronic Kidney Disease.    Anion gap 8 5 - 15    Dg Hip Operative Unilat With Pelvis Left  Result Date: 11/28/2016 CLINICAL DATA:  Left hip fracture. EXAM: OPERATIVE left HIP (WITH PELVIS IF PERFORMED) 6 VIEWS TECHNIQUE: Fluoroscopic spot image(s) were submitted for interpretation post-operatively. FLUOROSCOPY TIME:  1 minutes 21 seconds. Radiation exposure index:  10.92 mGy. COMPARISON:  Radiographs of November 26, 2016. FINDINGS: Six intraoperative fluoroscopic images of the left hip demonstrate surgical fixation of left femoral neck fracture. Good alignment of fracture components is noted. IMPRESSION: Status post surgical internal fixation of left femoral neck fracture. Electronically Signed   By: Marijo Conception, M.D.   On: 11/28/2016 08:13    Assessment/Plan: Vaginitis associated with  Pessary,now removed. Rx Metrogel  Qod x 2 wk.  Paula [name] 11/29/2016

## 2016-11-29 NOTE — Progress Notes (Signed)
Patient more cooperative this afternoon, able to give all PO meds at this time.  Ate about 25% of meal

## 2016-11-29 NOTE — Progress Notes (Signed)
Patient having moderate amount of vaginal spotting after removal of passery this afternoon.

## 2016-11-29 NOTE — Progress Notes (Signed)
Patient given medication (tramadol) at 0553, 11/29/16, patient spit medication at this nurse.  Patient also tried to hit this nurse.  Patient then started coughing up thick, yellow sputum and spitting it at this nurse as well.  Patient was instructed not to spit at the nurse and not to try hitting nurse.  Patient is very confused and does not know hwere she is.

## 2016-11-29 NOTE — Progress Notes (Signed)
Patient refusing dinner - spitting out food again.  Did take tramadol with some applesauce with a few extra bites.

## 2016-11-30 LAB — CBC
HEMATOCRIT: 28.1 % — AB (ref 36.0–46.0)
HEMOGLOBIN: 9 g/dL — AB (ref 12.0–15.0)
MCH: 26 pg (ref 26.0–34.0)
MCHC: 32 g/dL (ref 30.0–36.0)
MCV: 81.2 fL (ref 78.0–100.0)
Platelets: 512 10*3/uL — ABNORMAL HIGH (ref 150–400)
RBC: 3.46 MIL/uL — ABNORMAL LOW (ref 3.87–5.11)
RDW: 14.4 % (ref 11.5–15.5)
WBC: 12.8 10*3/uL — AB (ref 4.0–10.5)

## 2016-11-30 LAB — TYPE AND SCREEN
ABO/RH(D): O POS
Antibody Screen: NEGATIVE
Unit division: 0
Unit division: 0

## 2016-11-30 LAB — BASIC METABOLIC PANEL
ANION GAP: 6 (ref 5–15)
BUN: 23 mg/dL — ABNORMAL HIGH (ref 6–20)
CALCIUM: 8.3 mg/dL — AB (ref 8.9–10.3)
CHLORIDE: 108 mmol/L (ref 101–111)
CO2: 24 mmol/L (ref 22–32)
Creatinine, Ser: 0.62 mg/dL (ref 0.44–1.00)
GFR calc Af Amer: >60 mL/min (ref >60)
GFR calc non Af Amer: >60 mL/min (ref >60)
GLUCOSE: 111 mg/dL — AB (ref 65–99)
Potassium: 3.6 mmol/L (ref 3.5–5.1)
Sodium: 138 mmol/L (ref 135–145)

## 2016-11-30 MED ORDER — METOPROLOL TARTRATE 5 MG/5ML IV SOLN
5.0000 mg | Freq: Four times a day (QID) | INTRAVENOUS | Status: DC
  Administered 2016-11-30 – 2016-12-01 (×5): 5 mg via INTRAVENOUS
  Filled 2016-11-30 (×5): qty 5

## 2016-11-30 MED ORDER — CLONIDINE HCL 0.2 MG/24HR TD PTWK
0.2000 mg | MEDICATED_PATCH | TRANSDERMAL | Status: DC
  Administered 2016-11-30: 0.2 mg via TRANSDERMAL
  Filled 2016-11-30: qty 1

## 2016-11-30 NOTE — Progress Notes (Signed)
PROGRESS NOTE    Paula [name]  [id number] DOB: [birth year] DOA: 11/26/2016 PCP: Wende Neighbors, MD    Brief Narrative:  Paula [name] is a 82 y.o. female with medical history significant of dementia, hypothyroidism who is a resident of assisted living facility. Patient was walking with her husband yesterday who is also a resident of the facility when both fell.  Patient had x-ray done at the facility which showed nondisplaced subcapital fracture of the left femur. She was brought to the ER for evaluation where she was admitted and seen by orthopedics. Dr. [name] performed operative management on 12/14. She will likely need skilled nursing facility placement once her blood pressure is under fair control.    Assessment & Plan:   Active Problems:   Essential hypertension   Hypothyroidism, acquired   Fracture of femoral neck, left, closed (HCC)   Dementia   Femur fracture ([place])   Closed fracture of neck of left femur ([place])   1. Femur fracture. Orthopedics has been consulted. Patient underwent operative management on 12/14. Physical therapy consulted and she will need SNF. Follow up with orthopedics after discharge.  2. Hypothyroidism. Continue Synthroid  3. hypertension. Patient is not on any antihypertensives as an outpatient. Blood pressure has been running very high with sbp>200. She often spits out medications. Will start on clonidine patch.   4. Advanced dementia. Continue Ativan for agitation  5. Hypokalemia. Replace  6. Vaginal discharge. Noted by staff to have vaginal, malodorous discharge. Seen by GYN who noted that patient had a retained pessary. This was removed and she has been started on metrogel.     DVT prophylaxis: Aspirin Code Status: DO NOT RESUSCITATE Family Communication: No family present Disposition Plan: discharge to SNF once blood pressure under better control   Consultants:   Orthopedics  gynecology  Procedures:  Left hip was pinned with  3 cannulated screws from the [place] screw set using 3 washers with an inverted triangle configuration  Antimicrobials:      Subjective: Very hard of hearing. Limited conversation. No new complaints  Objective: Vitals:   11/30/16 0900 11/30/16 1249 11/30/16 1300 11/30/16 1401  BP: (!) 211/86 (!) 182/97 120/73 (!) 196/90  Pulse: (!) 102 80 65 87  Resp: 20 18 (!) 22 20  Temp: 98.5 F (36.9 C) 98.2 F (36.8 C) 99 F (37.2 C)   TempSrc: Axillary Axillary Axillary   SpO2: 98% 98% 94% 98%  Weight:      Height:        Intake/Output Summary (Last 24 hours) at 11/30/16 1432 Last data filed at 11/30/16 0800  Gross per 24 hour  Intake           238.75 ml  Output                1 ml  Net           237.75 ml   Filed Weights   11/26/16 0816 11/26/16 1300  Weight: 43.5 kg (96 lb) 45.2 kg (99 lb 10.4 oz)    Examination:  General exam: Appears calm and comfortable, very hard of hearing  Respiratory system: CTA B. Respiratory effort normal. Cardiovascular system: S1 & S2 heard, RRR. No JVD, murmurs, rubs, gallops or clicks. No pedal edema. Gastrointestinal system: Abdomen is nondistended, soft and nontender. No organomegaly or masses felt. Normal bowel sounds heard. Central nervous system: No focal neurological deficits. Extremities: Symmetric 5 x 5 power. Skin: No rashes, lesions or ulcers Psychiatry: confused  Data Reviewed: I have personally reviewed following labs and imaging studies  CBC:  Recent Labs Lab 11/26/16 0906 11/26/16 1919 11/27/16 0543 11/28/16 0536 11/29/16 0757 11/30/16 0545  WBC 15.0* 11.7* 11.9* 12.1* 14.5* 12.8*  NEUTROABS 13.0*  --   --   --   --   --   HGB 10.6* 10.9* 10.1* 9.1* 10.1* 9.0*  HCT 32.4* 33.3* 30.5* 28.2* 31.2* 28.1*  MCV 79.4 79.7 79.4 80.8 81.0 81.2  PLT 554* 523* 503* 494* 489* [id number]*   Basic Metabolic Panel:  Recent Labs Lab 11/26/16 0906 11/26/16 1919 11/27/16 0543 11/28/16 0536 11/29/16 0757 11/30/16 0545  NA 132*   --  133* 137 136 138  K 3.7  --  3.2* 3.5 3.4* 3.6  CL 99*  --  102 109 106 108  CO2 24  --  20* 20* 22 24  GLUCOSE 134*  --  124* 113* 134* 111*  BUN 16  --  20 20 27* 23*  CREATININE 0.72 0.76 0.84 0.72 0.68 0.62  CALCIUM 9.0  --  8.4* 8.3* 8.7* 8.3*   GFR: Estimated Creatinine Clearance: 32.7 mL/min (by C-G formula based on SCr of 0.62 mg/dL). Liver Function Tests: No results for input(s): AST, ALT, ALKPHOS, BILITOT, PROT, ALBUMIN in the last 168 hours. No results for input(s): LIPASE, AMYLASE in the last 168 hours. No results for input(s): AMMONIA in the last 168 hours. Coagulation Profile: No results for input(s): INR, PROTIME in the last 168 hours. Cardiac Enzymes: No results for input(s): CKTOTAL, CKMB, CKMBINDEX, TROPONINI in the last 168 hours. BNP (last 3 results) No results for input(s): PROBNP in the last 8760 hours. HbA1C: No results for input(s): HGBA1C in the last 72 hours. CBG:  Recent Labs Lab 11/27/16 1416 11/27/16 1738  GLUCAP 83 113*   Lipid Profile: No results for input(s): CHOL, HDL, LDLCALC, TRIG, CHOLHDL, LDLDIRECT in the last 72 hours. Thyroid Function Tests: No results for input(s): TSH, T4TOTAL, FREET4, T3FREE, THYROIDAB in the last 72 hours. Anemia Panel: No results for input(s): VITAMINB12, FOLATE, FERRITIN, TIBC, IRON, RETICCTPCT in the last 72 hours. Sepsis Labs: No results for input(s): PROCALCITON, LATICACIDVEN in the last 168 hours.  Recent Results (from the past 240 hour(s))  Surgical pcr screen     Status: None   Collection Time: 11/26/16  5:03 PM  Result Value Ref Range Status   MRSA, PCR NEGATIVE NEGATIVE Final   Staphylococcus aureus NEGATIVE NEGATIVE Final    Comment:        The Xpert SA Assay (FDA approved for NASAL specimens in patients over 33 years of age), is one component of a comprehensive surveillance program.  Test performance has been validated by [facility] for patients greater than or equal to 62 year old. It  is not intended to diagnose infection nor to guide or monitor treatment.          Radiology Studies: No results found.      Scheduled Meds: . aspirin  325 mg Oral Q breakfast  . cloNIDine  0.2 mg Transdermal Weekly  . donepezil  10 mg Oral QHS  . feeding supplement (ENSURE ENLIVE)  237 mL Oral TID BM  . feeding supplement (PRO-STAT SUGAR FREE 64)  30 mL Oral BID  . metoprolol  5 mg Intravenous Q6H  . metroNIDAZOLE  1 Applicatorful Vaginal [id number]  . polyethylene glycol  17 g Oral Daily  . traMADol  50 mg Oral Q6H   Continuous Infusions: . 0.9 % NaCl  with KCl 20 mEq / L 75 mL/hr (11/30/16 0110)     LOS: 4 days    Time spent: 72mins    MEMON,JEHANZEB, MD [facility] Pager [phone]  If 7PM-7AM, please contact night-coverage [email or web address] Password [email or web address] 11/30/2016, 2:32 PM

## 2016-12-01 ENCOUNTER — Inpatient Hospital Stay
Admission: RE | Admit: 2016-12-01 | Discharge: 2016-12-08 | Disposition: A | Discharge status: Nursing Home (Includes ICF) | Payer: PPO | Source: Ambulatory Visit | Attending: Internal Medicine | Admitting: Internal Medicine

## 2016-12-01 DIAGNOSIS — E785 Hyperlipidemia, unspecified: Secondary | ICD-10-CM | POA: Diagnosis not present

## 2016-12-01 DIAGNOSIS — R1312 Dysphagia, oropharyngeal phase: Secondary | ICD-10-CM | POA: Diagnosis not present

## 2016-12-01 DIAGNOSIS — S72012D Unspecified intracapsular fracture of left femur, subsequent encounter for closed fracture with routine healing: Secondary | ICD-10-CM | POA: Diagnosis not present

## 2016-12-01 DIAGNOSIS — I10 Essential (primary) hypertension: Secondary | ICD-10-CM

## 2016-12-01 DIAGNOSIS — Z4789 Encounter for other orthopedic aftercare: Secondary | ICD-10-CM | POA: Diagnosis not present

## 2016-12-01 DIAGNOSIS — R293 Abnormal posture: Secondary | ICD-10-CM | POA: Diagnosis not present

## 2016-12-01 DIAGNOSIS — E039 Hypothyroidism, unspecified: Secondary | ICD-10-CM

## 2016-12-01 DIAGNOSIS — R279 Unspecified lack of coordination: Secondary | ICD-10-CM | POA: Diagnosis not present

## 2016-12-01 DIAGNOSIS — E119 Type 2 diabetes mellitus without complications: Secondary | ICD-10-CM | POA: Diagnosis not present

## 2016-12-01 DIAGNOSIS — N183 Chronic kidney disease, stage 3 (moderate): Secondary | ICD-10-CM | POA: Diagnosis not present

## 2016-12-01 DIAGNOSIS — M6281 Muscle weakness (generalized): Secondary | ICD-10-CM | POA: Diagnosis not present

## 2016-12-01 DIAGNOSIS — Z9181 History of falling: Secondary | ICD-10-CM | POA: Diagnosis not present

## 2016-12-01 DIAGNOSIS — F0391 Unspecified dementia with behavioral disturbance: Secondary | ICD-10-CM | POA: Diagnosis not present

## 2016-12-01 LAB — BASIC METABOLIC PANEL
Anion gap: 8 (ref 5–15)
BUN: 17 mg/dL (ref 6–20)
CALCIUM: 8.4 mg/dL — AB (ref 8.9–10.3)
CO2: 24 mmol/L (ref 22–32)
Chloride: 102 mmol/L (ref 101–111)
Creatinine, Ser: 0.56 mg/dL (ref 0.44–1.00)
GFR calc Af Amer: >60 mL/min (ref >60)
GLUCOSE: 123 mg/dL — AB (ref 65–99)
POTASSIUM: 3.8 mmol/L (ref 3.5–5.1)
Sodium: 134 mmol/L — ABNORMAL LOW (ref 135–145)

## 2016-12-01 LAB — CBC
HEMATOCRIT: 30.3 % — AB (ref 36.0–46.0)
Hemoglobin: 9.9 g/dL — ABNORMAL LOW (ref 12.0–15.0)
MCH: 26.3 pg (ref 26.0–34.0)
MCHC: 32.7 g/dL (ref 30.0–36.0)
MCV: 80.6 fL (ref 78.0–100.0)
PLATELETS: 597 10*3/uL — AB (ref 150–400)
RBC: 3.76 MIL/uL — ABNORMAL LOW (ref 3.87–5.11)
RDW: 14.3 % (ref 11.5–15.5)
WBC: 14.4 10*3/uL — ABNORMAL HIGH (ref 4.0–10.5)

## 2016-12-01 MED ORDER — METRONIDAZOLE 0.75 % VA GEL: 1.0000 | VAGINAL | 0 refills | Status: DC

## 2016-12-01 MED ORDER — LORAZEPAM 0.5 MG PO TABS: 0.5000 mg | ORAL_TABLET | Freq: Two times a day (BID) | ORAL | 0 refills | Status: DC

## 2016-12-01 MED ORDER — CLONIDINE HCL 0.2 MG/24HR TD PTWK
0.2000 mg | MEDICATED_PATCH | TRANSDERMAL | 12 refills | Status: AC
Start: 2016-12-07 — End: ?

## 2016-12-01 MED ORDER — TRAMADOL HCL 50 MG PO TABS: 50.0000 mg | ORAL_TABLET | Freq: Four times a day (QID) | ORAL | 0 refills | Status: AC

## 2016-12-01 NOTE — Discharge Summary (Signed)
Physician Discharge Summary  Paula [name] [id number] DOB: 01-Apr-1923 DOA: 11/26/2016  PCP: Wende Neighbors, MD  Admit date: 11/26/2016 Discharge date: 12/01/2016  Admitted From: [facility]. Disposition:  SNF.   Recommendations for Outpatient Follow-up:  1. Follow up with PCP in 1-2 weeks 2. Follow up with orthopedics in 2-4 weeks.   Home Health: None.  Equipment/Devices: None.  Discharge Condition: Not as agitated.  Not in pain.  CODE STATUS: DNR.  Diet recommendation: as tolerated.   Brief/Interim Summary: Patient was admitted into the hospital for a fracture femur on Nov 26, 2016 by Dr Jolaine Artist.  As per his H and P:  " Paula [name] is a 82 y.o. female with medical history significant of dementia, hypothyroidism who is a resident of assisted living facility. Patient was walking with her husband yesterday who is also a resident of the facility when both fell. Patient is unable to provide significant history due to dementia and no family is present in the room. Patient had x-ray done at the facility which showed nondisplaced subcapital fracture of the left femur. Currently she complained of hip pain.  ED Course: In the ER she was evaluated where she was noted to have elevated blood pressure. Lab Work is relatively unrevealing. Due to femoral neck fracture, she's been referred for admission  Review of Systems: unable to assess due to dementia  HOSPITAL COURSE:  Patient underwent ORIT on Dec 14 by Dr [name].  Post op course was uncomplicated, but she was having severe hypertension.  She was noted to not have been on any anti HTN meds.  Originally, she was given Lopressor IV, and subsequently, Clonidine patch was added Q weekly.  Her BP was better controlled, going from 200's to 160-170.  She has dementia, and was agitated at times.  She refuses her BP meds at time.  It was noticed that she had vaginal odors, and GYN was consulted. Dr Gwenlyn Found saw her, and removed her pessary, and  place her on Metrogel anticipating total of 5 days, so it should end on Dec 21.  She is now stable for discharge, and will be discharged to SNF.  She will follow up with PCP next week, and with orthopedics as previously planned, 2-4 weeks.  Thank you and Good Day.   Discharge Diagnoses:  Active Problems:   Essential hypertension   Hypothyroidism, acquired   Fracture of femoral neck, left, closed (HCC)   Dementia   Femur fracture (HCC)   Closed fracture of neck of left femur (HCC)   Allergies as of 12/01/2016   No Known Allergies     Medication List    TAKE these medications   acetaminophen 500 MG chewable tablet Commonly known as:  TYLENOL Chew 500 mg by mouth 2 (two) times daily.   aspirin 325 MG EC tablet Take 1 tablet (325 mg total) by mouth daily with breakfast.   cloNIDine 0.2 mg/24hr patch Commonly known as:  CATAPRES - Dosed in mg/24 hr Place 1 patch (0.2 mg total) onto the skin once a week. Start taking on:  12/07/2016   donepezil 10 MG tablet Commonly known as:  ARICEPT Take 10 mg by mouth at bedtime.   LORazepam 0.5 MG tablet Commonly known as:  ATIVAN Take 0.5 mg by mouth 2 (two) times daily.   metroNIDAZOLE 0.75 % vaginal gel Commonly known as:  METROGEL Place 1 Applicatorful vaginally every other day.   polyethylene glycol packet Commonly known as:  MIRALAX / GLYCOLAX Take 17  g by mouth daily.   senna-docusate 8.6-50 MG tablet Commonly known as:  Senokot-S Take 1 tablet by mouth at bedtime as needed for mild constipation.   traMADol 50 MG tablet Commonly known as:  ULTRAM Take 1 tablet (50 mg total) by mouth every 6 (six) hours.       No Known Allergies  Consultations:  GYN  ORTHO.    Procedures/Studies: Dg Chest 1 View  Result Date: 11/26/2016 CLINICAL DATA:  Pain following fall EXAM: CHEST 1 VIEW COMPARISON:  March 19, 2016 FINDINGS: Lungs are clear. Heart size and pulmonary vascularity are normal. No adenopathy. There is  atherosclerotic calcification in the aorta. No evident bone lesions. No pneumothorax. IMPRESSION: Aortic atherosclerosis.  No edema or consolidation. Electronically Signed   By: Lowella Grip III M.D.   On: 11/26/2016 09:41   Dg Hip Operative Unilat With Pelvis Left  Result Date: 11/28/2016 CLINICAL DATA:  Left hip fracture. EXAM: OPERATIVE left HIP (WITH PELVIS IF PERFORMED) 6 VIEWS TECHNIQUE: Fluoroscopic spot image(s) were submitted for interpretation post-operatively. FLUOROSCOPY TIME:  1 minutes 21 seconds. Radiation exposure index:  10.92 mGy. COMPARISON:  Radiographs of November 26, 2016. FINDINGS: Six intraoperative fluoroscopic images of the left hip demonstrate surgical fixation of left femoral neck fracture. Good alignment of fracture components is noted. IMPRESSION: Status post surgical internal fixation of left femoral neck fracture. Electronically Signed   By: Marijo Conception, M.D.   On: 11/28/2016 08:13   Dg Hip Unilat W Or Wo Pelvis 2-3 Views Left  Result Date: 11/26/2016 CLINICAL DATA:  Pain following fall EXAM: DG HIP (WITH OR WITHOUT PELVIS) 2-3V LEFT COMPARISON:  None. FINDINGS: Frontal pelvis as well as lateral left hip images were obtained. There is an impacted subcapital femoral neck fracture on the left. The patient has had previous surgery on the right with screws transfixing the proximal right femur. The tips of the screws are in the proximal right femoral head. There is an old healed fracture in the subcapital right femoral region. No other fractures. No dislocations. There is mild narrowing of each hip joint. Bones are osteoporotic. IMPRESSION: Impacted subcapital femoral neck fracture on the left. Postoperative change right. No dislocation. Bones osteoporotic. Electronically Signed   By: Lowella Grip III M.D.   On: 11/26/2016 09:40      Subjective:  "Pretty Good" Discharge Exam: Vitals:   12/01/16 0100 12/01/16 0500  BP: (!) 194/65 (!) 177/77  Pulse: 83 88   Resp: 20 20  Temp: 97.6 F (36.4 C) 97.2 F (36.2 C)   Vitals:   11/30/16 1700 11/30/16 2100 12/01/16 0100 12/01/16 0500  BP: (!) 219/91 (!) 209/81 (!) 194/65 (!) 177/77  Pulse: 88 92 83 88  Resp: 20 20 20 20   Temp:  98.2 F (36.8 C) 97.6 F (36.4 C) 97.2 F (36.2 C)  TempSrc:  Axillary Oral Oral  SpO2: 98% 98% 100% 100%  Weight:      Height:        General: Pt is alert, awake, not in acute distress Cardiovascular: RRR, S1/S2 +, no rubs, no gallops Respiratory: CTA bilaterally, no wheezing, no rhonchi Abdominal: Soft, NT, ND, bowel sounds + Extremities: no edema, no cyanosis    The results of significant diagnostics from this hospitalization (including imaging, microbiology, ancillary and laboratory) are listed below for reference.     Microbiology: Recent Results (from the past 240 hour(s))  Surgical pcr screen     Status: None   Collection Time: 11/26/16  5:03 PM  Result Value Ref Range Status   MRSA, PCR NEGATIVE NEGATIVE Final   Staphylococcus aureus NEGATIVE NEGATIVE Final    Comment:        The Xpert SA Assay (FDA approved for NASAL specimens in patients over 94 years of age), is one component of a comprehensive surveillance program.  Test performance has been validated by [facility] for patients greater than or equal to 41 year old. It is not intended to diagnose infection nor to guide or monitor treatment.      Labs: Basic Metabolic Panel:  Recent Labs Lab 11/27/16 0543 11/28/16 0536 11/29/16 0757 11/30/16 0545 12/01/16 0515  NA 133* 137 136 138 134*  K 3.2* 3.5 3.4* 3.6 3.8  CL 102 109 106 108 102  CO2 20* 20* 22 24 24   GLUCOSE 124* 113* 134* 111* 123*  BUN 20 20 27* 23* 17  CREATININE 0.84 0.72 0.68 0.62 0.56  CALCIUM 8.4* 8.3* 8.7* 8.3* 8.4*   CBC:  Recent Labs Lab 11/26/16 0906  11/27/16 0543 11/28/16 0536 11/29/16 0757 11/30/16 0545 12/01/16 0515  WBC 15.0*  < > 11.9* 12.1* 14.5* 12.8* 14.4*  NEUTROABS 13.0*  --   --    --   --   --   --   HGB 10.6*  < > 10.1* 9.1* 10.1* 9.0* 9.9*  HCT 32.4*  < > 30.5* 28.2* 31.2* 28.1* 30.3*  MCV 79.4  < > 79.4 80.8 81.0 81.2 80.6  PLT 554*  < > 503* 494* 489* 512* 597*  < > = values in this interval not displayed. Cardiac Enzymes: No results for input(s): CKTOTAL, CKMB, CKMBINDEX, TROPONINI in the last 168 hours. BNP: Invalid input(s): POCBNP CBG:  Recent Labs Lab 11/27/16 1416 11/27/16 1738  GLUCAP 83 113*   Urinalysis    Component Value Date/Time   COLORURINE YELLOW 11/28/2016 [place] 11/28/2016 0555   LABSPEC 1.025 11/28/2016 0555   PHURINE 5.5 11/28/2016 0555   GLUCOSEU NEGATIVE 11/28/2016 0555   HGBUR TRACE (A) 11/28/2016 0555   BILIRUBINUR NEGATIVE 11/28/2016 0555   KETONESUR 15 (A) 11/28/2016 0555   PROTEINUR NEGATIVE 11/28/2016 0555   UROBILINOGEN 0.2 11/14/2011 1305   NITRITE NEGATIVE 11/28/2016 0555   LEUKOCYTESUR NEGATIVE 11/28/2016 0555   Sepsis Labs Invalid input(s): PROCALCITONIN,  WBC,  LACTICIDVEN Microbiology Recent Results (from the past 240 hour(s))  Surgical pcr screen     Status: None   Collection Time: 11/26/16  5:03 PM  Result Value Ref Range Status   MRSA, PCR NEGATIVE NEGATIVE Final   Staphylococcus aureus NEGATIVE NEGATIVE Final    Comment:        The Xpert SA Assay (FDA approved for NASAL specimens in patients over 25 years of age), is one component of a comprehensive surveillance program.  Test performance has been validated by [facility] for patients greater than or equal to 70 year old. It is not intended to diagnose infection nor to guide or monitor treatment.     Time coordinating discharge: Over 30 minutes SIGNED:  Orvan Falconer, MD FACP [facility] 12/01/2016, 9:24 AM   If 7PM-7AM, please contact night-coverage [email or web address] Password [email or web address]

## 2016-12-01 NOTE — Clinical Social Work Note (Signed)
CSW left a message for Paula [name] and Paula [name] at [facility] advising of patient discharge and authorization number [id number].  CSW left message for patient's granddaughter, Paula [name], advising of patient's discharge and transport to facility by hospital staff.   CSW signing off.      Paula [name], Paula Pugh, LCSW

## 2016-12-01 NOTE — Progress Notes (Signed)
Patient discharged to [facility]. Called report to [facility] nurse. IV removed and site intact.

## 2016-12-01 NOTE — Care Management Important Message (Signed)
Important Message  Patient Details  Name: Paula [name] MRN: [id number] Date of Birth: [birth year]   Medicare Important Message Given:  Yes    Jeroline Wolbert, Chauncey Reading, RN 12/01/2016, 9:31 AM

## 2016-12-02 ENCOUNTER — Non-Acute Institutional Stay (SKILLED_NURSING_FACILITY): Payer: PPO | Admitting: Internal Medicine

## 2016-12-02 ENCOUNTER — Other Ambulatory Visit: Payer: Self-pay

## 2016-12-02 ENCOUNTER — Encounter (HOSPITAL_COMMUNITY): Payer: Self-pay | Admitting: Orthopedic Surgery

## 2016-12-02 ENCOUNTER — Encounter: Payer: Self-pay | Admitting: Internal Medicine

## 2016-12-02 DIAGNOSIS — I10 Essential (primary) hypertension: Secondary | ICD-10-CM | POA: Diagnosis not present

## 2016-12-02 DIAGNOSIS — R5383 Other fatigue: Secondary | ICD-10-CM

## 2016-12-02 DIAGNOSIS — S72002S Fracture of unspecified part of neck of left femur, sequela: Secondary | ICD-10-CM | POA: Diagnosis not present

## 2016-12-02 DIAGNOSIS — F0280 Dementia in other diseases classified elsewhere without behavioral disturbance: Secondary | ICD-10-CM

## 2016-12-02 DIAGNOSIS — G309 Alzheimer's disease, unspecified: Secondary | ICD-10-CM

## 2016-12-02 MED ORDER — LORAZEPAM 0.5 MG PO TABS: 0.5000 mg | ORAL_TABLET | Freq: Two times a day (BID) | ORAL | 5 refills | Status: DC | PRN

## 2016-12-02 NOTE — Progress Notes (Signed)
Location:   [facility] Room Number: 124/P Place of Service:  SNF [phone]) Provider:  Fernand Parkins, MD  Patient Care Team: Celene Squibb, MD as PCP - General (Internal Medicine)  Extended Emergency Contact Information Primary Emergency Contact: Daniels,Rhiannon Address: [address]          [place], [place] [address] [place] Phone: [phone] Relation: Grandaughter Secondary Emergency Contact: Pollak,Barney Address: [address]          [place], [place] [address] Johnnette Litter of [place] Phone: [phone] Mobile Phone: [phone] Relation: Spouse  Code Status:  Full Code Goals of care: Advanced Directive information Advanced Directives 12/02/2016  Does Patient Have a Medical Advance Directive? Yes  Type of Advance Directive (No Data)  Does patient want to make changes to medical advance directive? No - Patient declined  Copy of [place] in Chart? -     Chief Complaint  Patient presents with  . Acute Visit  Secondary to somnolence and lethargy Follow-up left hip fracture with repair status post hospitalization   HPI:  Pt is a 82 y.o. female seen today for an acute visit forFollow-up of somnolence-lethargy  Patient is a recent admitted after sustaining a left hip fracture that was surgically repaired she also has a history of significant dementia as well as diabetes and anxiety-she has received Ativan 0.5 mg every 8 hours when necessary and there is some suspicion this may be contributing to her lethargy-she is responsive today vital signs are stable her blood pressure is somewhat elevated although this apparently varies.  But she is quite lethargic.    Marland Kitchen     Past Medical History:  Diagnosis Date  . Diabetes mellitus   . Diagnosis unknown   . High cholesterol   . Hypertension    Past Surgical History:  Procedure Laterality Date  . EYE SURGERY    . HIP PINNING,CANNULATED Right  03/20/2016   Procedure: INSERT SCREWS RIGHT HIP-CANNULATED PINNING RIGHT HIP;  Surgeon: Carole Civil, MD;  Location: [facility];  Service: Orthopedics;  Laterality: Right;  . HIP PINNING,CANNULATED Left 11/27/2016   Procedure: LEFT HIP PINNING;  Surgeon: Carole Civil, MD;  Location: [facility];  Service: Orthopedics;  Laterality: Left;    No Known Allergies  Current Outpatient Prescriptions on File Prior to Visit  Medication Sig Dispense Refill  . acetaminophen (TYLENOL) 500 MG chewable tablet Chew 500 mg by mouth 2 (two) times daily.     Marland Kitchen aspirin EC 325 MG EC tablet Take 1 tablet (325 mg total) by mouth daily with breakfast. 30 tablet 0  . [START ON 12/07/2016] cloNIDine (CATAPRES - DOSED IN MG/24 HR) 0.2 mg/24hr patch Place 1 patch (0.2 mg total) onto the skin once a week. 4 patch 12  . donepezil (ARICEPT) 10 MG tablet Take 10 mg by mouth at bedtime.    Marland Kitchen LORazepam (ATIVAN) 0.5 MG tablet Take 1 tablet (0.5 mg total) by mouth 2 (two) times daily as needed. For increased nervousness, crying, shaking, verbalizations of nervousness, decreased appetite 60 tablet 5  . metroNIDAZOLE (METROGEL) 0.75 % vaginal gel Place 1 Applicatorful vaginally every other day. 70 g 0  . polyethylene glycol (MIRALAX / GLYCOLAX) packet Take 17 g by mouth daily. 14 each 0  . senna-docusate (SENOKOT-S) 8.6-50 MG tablet Take 1 tablet by mouth at bedtime as needed for mild constipation.    . traMADol (ULTRAM) 50 MG tablet Take 1  tablet (50 mg total) by mouth every 6 (six) hours. 30 tablet 0   No current facility-administered medications on file prior to visit.      Review of Systems   This is essentially unattainable secondary to severe dementia as well as patient being lethargic  Immunization History  Administered Date(s) Administered  . Tdap 07/20/2011   Pertinent  Health Maintenance Due  Topic Date Due  . HEMOGLOBIN A1C  1925/03/05  . FOOT EXAM  02/08/1935  . OPHTHALMOLOGY EXAM  02/08/1935  . URINE  MICROALBUMIN  02/08/1935  . DEXA SCAN  02/08/1990  . INFLUENZA VACCINE  11/14/2017 (Originally 07/15/2016)  . PNA vac Low Risk Adult (1 of 2 - PCV13) 11/14/2017 (Originally 02/08/1990)   No flowsheet data found. Functional Status Survey:    Vitals:   12/02/16 1342  BP: (!) 186/91  Pulse: (!) 102  Resp: 20  Temp: 97.9 F (36.6 C)  TempSrc: Oral  SpO2: 93%  I note update blood pressures 154/79  Physical Exam   In general this is a frail elderly female resting in bed comfortably she appears sleepy but is responsive to verbal stimuli as well as painful stimuli.  Her skin is warm and dry she has covering over her left hip surgical site.  Eyes pupils are reactive light sclera and conjunctivae are clear visual acuity appears grossly intact although she does not keep her eyes open for very long.  Oropharynx is clear mucous membranes appear to be fairly moist although she did not open her mouth very wide.    Chest is clear to auscultation she has poor respiratory effort there is no labored breathing.  Heart is regular rate and rhythm without murmur gallop or rub she does not have significant lower extremity edema pedal pulses are intact bilaterally.  Her abdomen soft nontender with positive bowel sounds.  Muscle skeletal is able to move her extremities limited on the left leg secondary to the surgery her grip strength actually strong bilaterally she does have arthritic changes again surgical site left hip discomfort.  Neurologic as noted above she is lethargic and somnolent but I could not really see any lateralizing findings her cranial nerves appear to be intact.  She is responsive.  Psych she does have a history of significant dementia she is responsive although lethargic.   Labs reviewed:  Recent Labs  11/29/16 0757 11/30/16 0545 12/01/16 0515  NA 136 138 134*  K 3.4* 3.6 3.8  CL 106 108 102  CO2 22 24 24   GLUCOSE 134* 111* 123*  BUN 27* 23* 17  CREATININE 0.68  0.62 0.56  CALCIUM 8.7* 8.3* 8.4*    Recent Labs  03/20/16 0030  AST 45*  ALT 22  ALKPHOS 76  BILITOT 1.0  PROT 6.5  ALBUMIN 3.7    Recent Labs  03/20/16 0030  11/26/16 0906  11/29/16 0757 11/30/16 0545 12/01/16 0515  WBC 15.8*  < > 15.0*  < > 14.5* 12.8* 14.4*  NEUTROABS 13.3*  --  13.0*  --   --   --   --   HGB 12.7  < > 10.6*  < > 10.1* 9.0* 9.9*  HCT 38.0  < > 32.4*  < > 31.2* 28.1* 30.3*  MCV 91.8  < > 79.4  < > 81.0 81.2 80.6  PLT 365  < > 554*  < > 489* 512* 597*  < > = values in this interval not displayed. Lab Results  Component Value Date   TSH 5.739 (  H) 11/26/2016   No results found for: HGBA1C No results found for: CHOL, HDL, LDLCALC, LDLDIRECT, TRIG, CHOLHDL  Significant Diagnostic Results in last 30 days:  Dg Chest 1 View  Result Date: 11/26/2016 CLINICAL DATA:  Pain following fall EXAM: CHEST 1 VIEW COMPARISON:  March 19, 2016 FINDINGS: Lungs are clear. Heart size and pulmonary vascularity are normal. No adenopathy. There is atherosclerotic calcification in the aorta. No evident bone lesions. No pneumothorax. IMPRESSION: Aortic atherosclerosis.  No edema or consolidation. Electronically Signed   By: Lowella Grip III M.D.   On: 11/26/2016 09:41   Dg Hip Operative Unilat With Pelvis Left  Result Date: 11/28/2016 CLINICAL DATA:  Left hip fracture. EXAM: OPERATIVE left HIP (WITH PELVIS IF PERFORMED) 6 VIEWS TECHNIQUE: Fluoroscopic spot image(s) were submitted for interpretation post-operatively. FLUOROSCOPY TIME:  1 minutes 21 seconds. Radiation exposure index:  10.92 mGy. COMPARISON:  Radiographs of November 26, 2016. FINDINGS: Six intraoperative fluoroscopic images of the left hip demonstrate surgical fixation of left femoral neck fracture. Good alignment of fracture components is noted. IMPRESSION: Status post surgical internal fixation of left femoral neck fracture. Electronically Signed   By: Marijo Conception, M.D.   On: 11/28/2016 08:13   Dg Hip  Unilat W Or Wo Pelvis 2-3 Views Left  Result Date: 11/26/2016 CLINICAL DATA:  Pain following fall EXAM: DG HIP (WITH OR WITHOUT PELVIS) 2-3V LEFT COMPARISON:  None. FINDINGS: Frontal pelvis as well as lateral left hip images were obtained. There is an impacted subcapital femoral neck fracture on the left. The patient has had previous surgery on the right with screws transfixing the proximal right femur. The tips of the screws are in the proximal right femoral head. There is an old healed fracture in the subcapital right femoral region. No other fractures. No dislocations. There is mild narrowing of each hip joint. Bones are osteoporotic. IMPRESSION: Impacted subcapital femoral neck fracture on the left. Postoperative change right. No dislocation. Bones osteoporotic. Electronically Signed   By: Lowella Grip III M.D.   On: 11/26/2016 09:40    Assessment/Plan #1-lethargy somnolence-I suspect this may be related to her Ativan-will reduce the dose in half to 0.25 mg every 8 hours when necessary staff does note she still has at times significant agitation and anxiety-some of the somnolence may be the result of possible agitation last night and that she is just tired from being up.  Continue to monitor closely.  History of dementia with anxiety again will reduce the Ativan dose-she is on Aricept at this point continue supportive care.  #3 history of hypertension I see variable systolics between [id number] and 180s since we have minimal readings will monitor for now she does have a clonidine patch some of this may be pain related as well as she is on tramadol every 6 hours when necessary this will have to be monitored. She was started on clonidine patch in the hospital-will give this a bit more time.  #4-history of diabetes-her blood sugars on metabolic panels have been less than 150 at this point will monitor CBGs and facility.  #5 history hypothyroidism-she is on Synthroid we will need an updated TSH was  slightly elevated in the hospital.  #6-history of vaginitis-she is finishing a course of MetroGel she had her pessary replaced in the hospital.  #7 anemia suspect there is some postop etiology here hemoglobin was 9.9 on discharge from hospital previous had been in the 11-14 range will update this.  #8 history of left hip  fracture with repair-she is on aspirin for anticoagulation she is receiving tramadol as needed for pain-I suspect with her dementia therapy may be a challenge will have to continue supportive care follow-up by orthopedics as well  [id number]-of note greater than 35 minutes spent assessing patient-discussing her status with nursing staff-reviewing her chart and labs-and coordinating formulating a plan of care for numerous diagnoses-of note greater than 50% of time spent coordinating plan of care  .      Oralia Manis, [place]

## 2016-12-02 NOTE — Telephone Encounter (Signed)
RX faxed to [facility] @ [phone]. Phone number [phone]  

## 2016-12-03 ENCOUNTER — Other Ambulatory Visit: Payer: Self-pay | Admitting: *Deleted

## 2016-12-03 ENCOUNTER — Encounter: Payer: Self-pay | Admitting: Internal Medicine

## 2016-12-03 ENCOUNTER — Encounter (HOSPITAL_COMMUNITY)
Admission: RE | Admit: 2016-12-03 | Discharge: 2016-12-03 | Disposition: A | Discharge status: Home / Self Care | Payer: PPO | Source: Skilled Nursing Facility | Attending: Internal Medicine | Admitting: Internal Medicine

## 2016-12-03 ENCOUNTER — Non-Acute Institutional Stay (SKILLED_NURSING_FACILITY): Payer: PPO | Admitting: Internal Medicine

## 2016-12-03 DIAGNOSIS — N183 Chronic kidney disease, stage 3 (moderate): Secondary | ICD-10-CM | POA: Insufficient documentation

## 2016-12-03 DIAGNOSIS — S72002S Fracture of unspecified part of neck of left femur, sequela: Secondary | ICD-10-CM

## 2016-12-03 DIAGNOSIS — F0280 Dementia in other diseases classified elsewhere without behavioral disturbance: Secondary | ICD-10-CM

## 2016-12-03 DIAGNOSIS — E039 Hypothyroidism, unspecified: Secondary | ICD-10-CM | POA: Insufficient documentation

## 2016-12-03 DIAGNOSIS — I10 Essential (primary) hypertension: Secondary | ICD-10-CM | POA: Diagnosis not present

## 2016-12-03 DIAGNOSIS — Z4789 Encounter for other orthopedic aftercare: Secondary | ICD-10-CM | POA: Insufficient documentation

## 2016-12-03 DIAGNOSIS — N760 Acute vaginitis: Secondary | ICD-10-CM

## 2016-12-03 DIAGNOSIS — D649 Anemia, unspecified: Secondary | ICD-10-CM

## 2016-12-03 DIAGNOSIS — Z9181 History of falling: Secondary | ICD-10-CM | POA: Insufficient documentation

## 2016-12-03 DIAGNOSIS — E876 Hypokalemia: Secondary | ICD-10-CM | POA: Diagnosis not present

## 2016-12-03 DIAGNOSIS — S72012D Unspecified intracapsular fracture of left femur, subsequent encounter for closed fracture with routine healing: Secondary | ICD-10-CM | POA: Insufficient documentation

## 2016-12-03 DIAGNOSIS — G309 Alzheimer's disease, unspecified: Secondary | ICD-10-CM | POA: Diagnosis not present

## 2016-12-03 DIAGNOSIS — E119 Type 2 diabetes mellitus without complications: Secondary | ICD-10-CM

## 2016-12-03 LAB — CBC WITH DIFFERENTIAL/PLATELET
Basophils Absolute: 0 10*3/uL (ref 0.0–0.1)
Basophils Relative: 0 %
EOS PCT: 1 %
Eosinophils Absolute: 0.1 10*3/uL (ref 0.0–0.7)
HCT: 29 % — ABNORMAL LOW (ref 36.0–46.0)
Hemoglobin: 9.5 g/dL — ABNORMAL LOW (ref 12.0–15.0)
LYMPHS ABS: 1.7 10*3/uL (ref 0.7–4.0)
Lymphocytes Relative: 13 %
MCH: 26.4 pg (ref 26.0–34.0)
MCHC: 32.8 g/dL (ref 30.0–36.0)
MCV: 80.6 fL (ref 78.0–100.0)
MONOS PCT: 10 %
Monocytes Absolute: 1.3 10*3/uL — ABNORMAL HIGH (ref 0.1–1.0)
Neutro Abs: 10.3 10*3/uL — ABNORMAL HIGH (ref 1.7–7.7)
Neutrophils Relative %: 76 %
PLATELETS: 574 10*3/uL — AB (ref 150–400)
RBC: 3.6 MIL/uL — ABNORMAL LOW (ref 3.87–5.11)
RDW: 14.2 % (ref 11.5–15.5)
WBC: 13.4 10*3/uL — ABNORMAL HIGH (ref 4.0–10.5)

## 2016-12-03 LAB — BASIC METABOLIC PANEL
Anion gap: 6 (ref 5–15)
BUN: 25 mg/dL — AB (ref 6–20)
CHLORIDE: 104 mmol/L (ref 101–111)
CO2: 27 mmol/L (ref 22–32)
Calcium: 8.3 mg/dL — ABNORMAL LOW (ref 8.9–10.3)
Creatinine, Ser: 0.62 mg/dL (ref 0.44–1.00)
GFR calc Af Amer: >60 mL/min (ref >60)
GFR calc non Af Amer: >60 mL/min (ref >60)
Glucose, Bld: 105 mg/dL — ABNORMAL HIGH (ref 65–99)
Potassium: 3.4 mmol/L — ABNORMAL LOW (ref 3.5–5.1)
Sodium: 137 mmol/L (ref 135–145)

## 2016-12-03 MED ORDER — LORAZEPAM 0.5 MG PO TABS: 0.2500 mg | ORAL_TABLET | Freq: Three times a day (TID) | ORAL | 1 refills | Status: AC | PRN

## 2016-12-03 NOTE — Progress Notes (Signed)
Provider:  Veleta Miners Location:   [facility] Room Number: 124/P Place of Service:  SNF (31)  PCP: Wende Neighbors, MD Patient Care Team: Celene Squibb, MD as PCP - General (Internal Medicine)  Extended Emergency Contact Information Primary Emergency Contact: Daniels,Rhiannon Address: [address]          [place], [place] [address] [place] Phone: [phone] Relation: Grandaughter Secondary Emergency Contact: Sotto,Barney Address: [address]          [place], [place] [address] Johnnette Litter of [place] Phone: [phone] Mobile Phone: [phone] Relation: Spouse  Code Status: Full Code Goals of Care: Advanced Directive information Advanced Directives 12/03/2016  Does Patient Have a Medical Advance Directive? Yes  Type of Advance Directive (No Data)  Does patient want to make changes to medical advance directive? No - Patient declined  Copy of [place] in Chart? -      Chief Complaint  Patient presents with  . New Admit To SNF    HPI: Patient is a 82 y.o. female seen today for admission to SNF for therapy. Patient has h/o dementia, Hypothyroidism, Uterovaginal Prolapse, Previous Right hip fracture in 04/17, Diabetes Mellitus not on any hypoglycemics. And Hypertension.  She was living with her husband in the Assisted living facility with her husband when they both fell. She as admitted with Closed Left Femoral Neck Fracture.  She underwent ORIF on 11/27/16. Her post OP course was complicated by Elevated Blood pressure. She finally responded to Clonidine patch . It seems patient was non Compliant to her oral meds in the [facility].She also was found to have Foul smelling Vaginal discharge.Her Vaginal Pessary was removed and she was started on Metrogel. Patient Is unable to give me any more history. She is lying in the bed is alert but mumbling and very hard to understand. She does not look in any kind of  distress. Per nurses she is more awake since her Ativan was stopped.  Past Medical History:  Diagnosis Date  . Diabetes mellitus   . Diagnosis unknown   . High cholesterol   . Hypertension    Past Surgical History:  Procedure Laterality Date  . EYE SURGERY    . HIP PINNING,CANNULATED Right 03/20/2016   Procedure: INSERT SCREWS RIGHT HIP-CANNULATED PINNING RIGHT HIP;  Surgeon: Carole Civil, MD;  Location: [facility];  Service: Orthopedics;  Laterality: Right;  . HIP PINNING,CANNULATED Left 11/27/2016   Procedure: LEFT HIP PINNING;  Surgeon: Carole Civil, MD;  Location: [facility];  Service: Orthopedics;  Laterality: Left;    reports that she has never smoked. She has never used smokeless tobacco. She reports that she does not drink alcohol or use drugs. Social History   Social History  . Marital status: Married    Spouse name: N/A  . Number of children: N/A  . Years of education: N/A   Occupational History  . Not on file.   Social History Main Topics  . Smoking status: Never Smoker  . Smokeless tobacco: Never Used  . Alcohol use No  . Drug use: No  . Sexual activity: No   Other Topics Concern  . Not on file   Social History Narrative  . No narrative on file    Functional Status Survey:    Family History  Problem Relation Age of Onset  . Hypertension Son   . Hypertension Grandchild     Health Maintenance  Topic Date Due  .  HEMOGLOBIN A1C  1925/07/08  . FOOT EXAM  02/08/1935  . OPHTHALMOLOGY EXAM  02/08/1935  . URINE MICROALBUMIN  02/08/1935  . ZOSTAVAX  02/08/1985  . DEXA SCAN  02/08/1990  . INFLUENZA VACCINE  11/14/2017 (Originally 07/15/2016)  . PNA vac Low Risk Adult (1 of 2 - PCV13) 11/14/2017 (Originally 02/08/1990)  . TETANUS/TDAP  07/19/2021    No Known Allergies  Current Outpatient Prescriptions on File Prior to Visit  Medication Sig Dispense Refill  . acetaminophen (TYLENOL) 500 MG chewable tablet Chew 500 mg by mouth 2 (two) times daily.       Marland Kitchen aspirin EC 325 MG EC tablet Take 1 tablet (325 mg total) by mouth daily with breakfast. 30 tablet 0  . [START ON 12/07/2016] cloNIDine (CATAPRES - DOSED IN MG/24 HR) 0.2 mg/24hr patch Place 1 patch (0.2 mg total) onto the skin once a week. 4 patch 12  . donepezil (ARICEPT) 10 MG tablet Take 10 mg by mouth at bedtime.    . metroNIDAZOLE (METROGEL) 0.75 % vaginal gel Place 1 Applicatorful vaginally every other day. 70 g 0  . polyethylene glycol (MIRALAX / GLYCOLAX) packet Take 17 g by mouth daily. 14 each 0  . traMADol (ULTRAM) 50 MG tablet Take 1 tablet (50 mg total) by mouth every 6 (six) hours. 30 tablet 0   No current facility-administered medications on file prior to visit.      Review of Systems  Unable to perform ROS: Dementia    Vitals:   12/03/16 0832  BP: (!) 151/81  Pulse: 83  Resp: 20  Temp: 97.8 F (36.6 C)  TempSrc: Oral  SpO2: 96%   There is no height or weight on file to calculate BMI. Physical Exam  Constitutional: She appears well-developed. She appears cachectic.  HENT:  Head: Normocephalic.  Eyes: Pupils are equal, round, and reactive to light.  Neck: Neck supple.  Cardiovascular: Normal rate, regular rhythm and normal heart sounds.   Pulmonary/Chest: Effort normal and breath sounds normal. No respiratory distress. She has no wheezes. She has no rales.  Abdominal: Soft. Bowel sounds are normal. She exhibits no distension. There is no tenderness.  Musculoskeletal: She exhibits no edema.  Lymphadenopathy:    She has no cervical adenopathy.  Neurological: She is alert.  Patient does not follow even simple commands right now.   Skin: Skin is warm. No rash noted.  Psychiatric:  Patient was alert and looked comfortable. But per nurses she has been anxious and agitated.    Labs reviewed: Basic Metabolic Panel:  Recent Labs  11/30/16 0545 12/01/16 0515 12/03/16 0700  NA 138 134* 137  K 3.6 3.8 3.4*  CL 108 102 104  CO2 24 24 27   GLUCOSE 111* 123*  105*  BUN 23* 17 25*  CREATININE 0.62 0.56 0.62  CALCIUM 8.3* 8.4* 8.3*   Liver Function Tests:  Recent Labs  03/20/16 0030  AST 45*  ALT 22  ALKPHOS 76  BILITOT 1.0  PROT 6.5  ALBUMIN 3.7   No results for input(s): LIPASE, AMYLASE in the last 8760 hours. No results for input(s): AMMONIA in the last 8760 hours. CBC:  Recent Labs  03/20/16 0030  11/26/16 0906  11/30/16 0545 12/01/16 0515 12/03/16 0700  WBC 15.8*  < > 15.0*  < > 12.8* 14.4* 13.4*  NEUTROABS 13.3*  --  13.0*  --   --   --  10.3*  HGB 12.7  < > 10.6*  < > 9.0* 9.9* 9.5*  HCT 38.0  < > 32.4*  < > 28.1* 30.3* 29.0*  MCV 91.8  < > 79.4  < > 81.2 80.6 80.6  PLT 365  < > 554*  < > 512* 597* 574*  < > = values in this interval not displayed. Cardiac Enzymes: No results for input(s): CKTOTAL, CKMB, CKMBINDEX, TROPONINI in the last 8760 hours. BNP: Invalid input(s): POCBNP No results found for: HGBA1C Lab Results  Component Value Date   TSH 5.739 (H) 11/26/2016   No results found for: VITAMINB12 No results found for: FOLATE No results found for: IRON, TIBC, FERRITIN  Imaging and Procedures obtained prior to SNF admission: No results found.  Assessment/Plan  Closed fracture of neck of left femur This is second fracture for this patient in past 8 months. She is on aspirin to be continued for 30 days. Her pain seem to be controlled on Ultram and tylenol PRN.Has follow up with Dr [name]. With her severe Dementia she would not be able to do well with therapy. Her prognosis is very poor. She is very high risk for fall. Most likely she will be long term resident. Will also start her on Calcium and Vitamin D.  Controlled type 2 diabetes mellitus Her BS in labs have been less then 150. It seems she was on Metformin before which was discontinued.  Will check A1C. Accu checks 3 times a week.   Essential hypertension  BP much better on clonidine patch. Will continue to monitor Vitals.  Acute vaginitis On  Metrogel for total of 5 Days  Alzheimer's dementia  Patient has advanced Dementia. She is on Aricept. Her prognosis stays poor.  Hypothyroidism TSH was Slightly Elevated in the hospital. Will repeat TSH and not make any changes right now. It can be that she was missing doses in the Facility.  Hypokalemia Start on potassium supplement.  Anemia, Most likely Post Fracture. Follow HGB Start on iron.     Family/ staff Communication: D/W the Nursing Staff and Speech therapist.  Labs/tests ordered: CBC , BMP, TSH , HbA1C in 1 week. Accu Checks

## 2016-12-03 NOTE — Telephone Encounter (Signed)
[facility] #[phone] Fax: [phone]   

## 2016-12-04 ENCOUNTER — Telehealth: Payer: Self-pay | Admitting: Orthopedic Surgery

## 2016-12-04 NOTE — Telephone Encounter (Signed)
Advised to keep in place until staple removal

## 2016-12-04 NOTE — Telephone Encounter (Signed)
Ford @ [facility] called asking about this patient's Left Hip Dressing. She says the dressing is brown. She is wanting to know if they need to leave it on or do they change it daily.  She had surgery on 11/27/16   Left Fx Femoral Neck  Please call and advise  [phone]

## 2016-12-08 ENCOUNTER — Emergency Department (HOSPITAL_COMMUNITY)
Admission: EM | Admit: 2016-12-08 | Discharge: 2016-12-08 | Disposition: A | Discharge status: Home / Self Care | Payer: PPO | Attending: Emergency Medicine | Admitting: Emergency Medicine

## 2016-12-08 ENCOUNTER — Encounter (HOSPITAL_COMMUNITY): Payer: Self-pay | Admitting: Cardiology

## 2016-12-08 ENCOUNTER — Emergency Department (HOSPITAL_COMMUNITY): Payer: PPO

## 2016-12-08 ENCOUNTER — Other Ambulatory Visit (HOSPITAL_COMMUNITY): Payer: Self-pay | Admitting: Emergency Medicine

## 2016-12-08 ENCOUNTER — Inpatient Hospital Stay
Admission: RE | Admit: 2016-12-08 | Discharge: 2016-12-14 | Disposition: A | Discharge status: Nursing Home (Includes ICF) | Payer: PPO | Source: Ambulatory Visit | Attending: Internal Medicine | Admitting: Internal Medicine

## 2016-12-08 DIAGNOSIS — I129 Hypertensive chronic kidney disease with stage 1 through stage 4 chronic kidney disease, or unspecified chronic kidney disease: Secondary | ICD-10-CM | POA: Diagnosis not present

## 2016-12-08 DIAGNOSIS — M7989 Other specified soft tissue disorders: Secondary | ICD-10-CM

## 2016-12-08 DIAGNOSIS — Z7982 Long term (current) use of aspirin: Secondary | ICD-10-CM | POA: Diagnosis not present

## 2016-12-08 DIAGNOSIS — Z79899 Other long term (current) drug therapy: Secondary | ICD-10-CM | POA: Insufficient documentation

## 2016-12-08 DIAGNOSIS — R600 Localized edema: Secondary | ICD-10-CM | POA: Insufficient documentation

## 2016-12-08 DIAGNOSIS — E1122 Type 2 diabetes mellitus with diabetic chronic kidney disease: Secondary | ICD-10-CM | POA: Insufficient documentation

## 2016-12-08 DIAGNOSIS — E039 Hypothyroidism, unspecified: Secondary | ICD-10-CM | POA: Diagnosis not present

## 2016-12-08 DIAGNOSIS — S72012A Unspecified intracapsular fracture of left femur, initial encounter for closed fracture: Secondary | ICD-10-CM | POA: Diagnosis not present

## 2016-12-08 DIAGNOSIS — R609 Edema, unspecified: Secondary | ICD-10-CM

## 2016-12-08 DIAGNOSIS — N183 Chronic kidney disease, stage 3 (moderate): Secondary | ICD-10-CM | POA: Diagnosis not present

## 2016-12-08 LAB — BASIC METABOLIC PANEL
Anion gap: 7 (ref 5–15)
BUN: 32 mg/dL — ABNORMAL HIGH (ref 6–20)
CALCIUM: 8.5 mg/dL — AB (ref 8.9–10.3)
CO2: 29 mmol/L (ref 22–32)
CREATININE: 0.76 mg/dL (ref 0.44–1.00)
Chloride: 101 mmol/L (ref 101–111)
GFR calc non Af Amer: >60 mL/min (ref >60)
Glucose, Bld: 156 mg/dL — ABNORMAL HIGH (ref 65–99)
Potassium: 3.5 mmol/L (ref 3.5–5.1)
SODIUM: 137 mmol/L (ref 135–145)

## 2016-12-08 LAB — CBC
HCT: 28 % — ABNORMAL LOW (ref 36.0–46.0)
Hemoglobin: 8.8 g/dL — ABNORMAL LOW (ref 12.0–15.0)
MCH: 25.8 pg — ABNORMAL LOW (ref 26.0–34.0)
MCHC: 31.4 g/dL (ref 30.0–36.0)
MCV: 82.1 fL (ref 78.0–100.0)
PLATELETS: 639 10*3/uL — AB (ref 150–400)
RBC: 3.41 MIL/uL — ABNORMAL LOW (ref 3.87–5.11)
RDW: 14.6 % (ref 11.5–15.5)
WBC: 13.1 10*3/uL — ABNORMAL HIGH (ref 4.0–10.5)

## 2016-12-08 LAB — D-DIMER, QUANTITATIVE (NOT AT ARMC): D DIMER QUANT: 6.06 ug{FEU}/mL — AB (ref 0.00–0.50)

## 2016-12-08 MED ORDER — ENOXAPARIN SODIUM 40 MG/0.4ML ~~LOC~~ SOLN: 40.0000 mg | SUBCUTANEOUS | 0 refills | Status: DC

## 2016-12-08 MED ORDER — ENOXAPARIN SODIUM 60 MG/0.6ML ~~LOC~~ SOLN
1.0000 mg/kg | Freq: Once | SUBCUTANEOUS | Status: AC
  Administered 2016-12-08: 14:00:00 45 mg via SUBCUTANEOUS
  Filled 2016-12-08: qty 0.6

## 2016-12-08 NOTE — ED Triage Notes (Signed)
Here from [facility],  Recent left hip fracture and surgery.  Per staff left leg is swollen.

## 2016-12-08 NOTE — ED Notes (Signed)
Lab at bedside

## 2016-12-08 NOTE — ED Provider Notes (Signed)
[facility] DEPT Provider Note   CSN: [id number] Arrival date & time: 12/08/16  1047  By signing my name below, I, Dolores Hoose, attest that this documentation has been prepared under the direction and in the presence of Pattricia Boss, MD . Electronically Signed: Dolores Hoose, Scribe. 12/08/2016. 11:32 AM.  History   Chief Complaint Chief Complaint  Patient presents with  . Leg Swelling   The history is provided by the patient and a relative. No language interpreter was used.    HPI Comments:  Paula [name] is a 82 y.o. female with pmhx of dementia, DM, HTN and HLD who presents to the Emergency Department from the [facility] center complaining of constant worsened left leg swelling and pain s/p fall occurring about 13 days ago. Pt fell 13 days ago and broke her hip and had surgery 12 days ago. The pt has been at the [facility] for rehab recovery for the past 6 days and was sent to the ED today for worsened swelling of her left leg. She reports associated post-surgical wound to her hip. Her pain is exacerbated by movement and palpation. Pt denies any other symptoms.   Past Medical History:  Diagnosis Date  . Diabetes mellitus   . Diagnosis unknown   . High cholesterol   . Hypertension     Patient Active Problem List   Diagnosis Date Noted  . Closed fracture of neck of left femur ([place])   . Fracture of femoral neck, left, closed ([place]) 11/26/2016  . Dementia 11/26/2016  . Femur fracture ([place]) 11/26/2016  . Essential hypertension 03/20/2016  . Controlled type 2 diabetes mellitus without complication, without long-term current use of insulin ([place]) 03/20/2016  . Hypothyroidism, acquired 03/20/2016  . Subcapital fracture of right hip ([place]) 03/20/2016  . CKD (chronic kidney disease) stage 3, GFR 30-59 ml/min 03/20/2016  . UTI (lower urinary tract infection) 03/20/2016  . Subcapital fracture of hip ([place]) 03/20/2016  . Hip fracture ([place]) 03/20/2016  . Uterovaginal prolapse, complete  06/06/2013    Past Surgical History:  Procedure Laterality Date  . EYE SURGERY    . HIP PINNING,CANNULATED Right 03/20/2016   Procedure: INSERT SCREWS RIGHT HIP-CANNULATED PINNING RIGHT HIP;  Surgeon: Carole Civil, MD;  Location: [facility];  Service: Orthopedics;  Laterality: Right;  . HIP PINNING,CANNULATED Left 11/27/2016   Procedure: LEFT HIP PINNING;  Surgeon: Carole Civil, MD;  Location: [facility];  Service: Orthopedics;  Laterality: Left;    OB History    No data available       Home Medications    Prior to Admission medications   Medication Sig Start Date End Date Taking? Authorizing Provider  acetaminophen (TYLENOL) 500 MG chewable tablet Chew 500 mg by mouth 2 (two) times daily.     Historical Provider, MD  aspirin EC 325 MG EC tablet Take 1 tablet (325 mg total) by mouth daily with breakfast. 03/24/16   Kathie Dike, MD  cloNIDine (CATAPRES - DOSED IN MG/24 HR) 0.2 mg/24hr patch Place 1 patch (0.2 mg total) onto the skin once a week. 12/07/16   Orvan Falconer, MD  donepezil (ARICEPT) 10 MG tablet Take 10 mg by mouth at bedtime.    Historical Provider, MD  LORazepam (ATIVAN) 0.5 MG tablet Take 0.5 tablets (0.25 mg total) by mouth every 8 (eight) hours as needed for anxiety. 12/03/16   Gildardo Cranker, DO  metroNIDAZOLE (METROGEL) 0.75 % vaginal gel Place 1 Applicatorful vaginally every other day. 12/01/16   Orvan Falconer,  MD  polyethylene glycol (MIRALAX / GLYCOLAX) packet Take 17 g by mouth daily. 03/24/16   Kathie Dike, MD  traMADol (ULTRAM) 50 MG tablet Take 1 tablet (50 mg total) by mouth every 6 (six) hours. 12/01/16   Orvan Falconer, MD    Family History Family History  Problem Relation Age of Onset  . Hypertension Son   . Hypertension Grandchild     Social History Social History  Substance Use Topics  . Smoking status: Never Smoker  . Smokeless tobacco: Never Used  . Alcohol use No     Allergies   Patient has no known allergies.   Review of Systems Review of  Systems  Cardiovascular: Positive for leg swelling.  Musculoskeletal: Positive for arthralgias and myalgias.  Skin: Positive for wound.  All other systems reviewed and are negative.    Physical Exam Updated Vital Signs BP 175/70   Pulse 87   Temp 97.9 F (36.6 C)   Resp 16   Ht 5\' 2"  (1.575 m)   Wt 99 lb (44.9 kg)   SpO2 100%   BMI 18.11 kg/m   Physical Exam  Constitutional: She appears well-developed and well-nourished. No distress.  Patient very hoh  HENT:  Head: Normocephalic and atraumatic.  Right Ear: External ear normal.  Left Ear: External ear normal.  Nose: Nose normal.  Eyes: Conjunctivae and EOM are normal. Pupils are equal, round, and reactive to light.  Neck: Normal range of motion. Neck supple.  Pulmonary/Chest: Effort normal and breath sounds normal.  Abdominal: Soft. Bowel sounds are normal.  Musculoskeletal:  Bilateral lower extremities contractured with dressing in place left hip, pain with attempt to straighten lle.  Pitting edema noted  Neurological: She is alert. She exhibits normal muscle tone. Coordination normal.  Patient oriented to hospital  Skin: Skin is warm and dry. Capillary refill takes less than 2 seconds.  Psychiatric: She has a normal mood and affect.  Nursing note and vitals reviewed.    ED Treatments / Results  DIAGNOSTIC STUDIES:  Oxygen Saturation is 100% on RA, normal by my interpretation.    COORDINATION OF CARE:  11:39 AM Discussed treatment plan with pt's family at bedside which includes contact with rehab facility to determine patients mobility and pt agreed to plan.  Labs (all labs ordered are listed, but only abnormal results are displayed) Labs Reviewed - No data to display  EKG  EKG Interpretation None       Radiology No results found.  Procedures Procedures (including critical care time)  Medications Ordered in ED Medications - No data to display   Initial Impression / Assessment and Plan / ED  Course  I have reviewed the triage vital signs and the nursing notes.  Pertinent labs & imaging results that were available during my care of the patient were reviewed by me and considered in my medical decision making (see chart for details).  Clinical Course    Patient with recent left hip surgery with x-rays c.w. S/p repair and now with pitting id.  New pitting edema noted.  No [place] available now.  D-dimer elevated- plan lovenox and return tomorrow for [place]. Lovenox prescription given for administration at 12 hour mark  Final Clinical Impressions(s) / ED Diagnoses   Final diagnoses:  Peripheral edema    New Prescriptions New Prescriptions   No medications on file  I personally performed the services described in this documentation, which was scribed in my presence. The recorded information has been reviewed and considered.  Pattricia Boss, MD 12/08/16 [id number]

## 2016-12-08 NOTE — Discharge Instructions (Signed)
Please return as scheduled for ultrasound in the morning.

## 2016-12-08 NOTE — ED Notes (Signed)
Report given to Asa Lente, Therapist, sports at [facility]

## 2016-12-09 ENCOUNTER — Ambulatory Visit (HOSPITAL_COMMUNITY)
Admit: 2016-12-09 | Discharge: 2016-12-09 | Disposition: A | Discharge status: Home / Self Care | Payer: PPO | Attending: Emergency Medicine | Admitting: Emergency Medicine

## 2016-12-09 ENCOUNTER — Other Ambulatory Visit: Payer: Self-pay | Admitting: Internal Medicine

## 2016-12-09 ENCOUNTER — Non-Acute Institutional Stay (SKILLED_NURSING_FACILITY): Payer: PPO | Admitting: Internal Medicine

## 2016-12-09 ENCOUNTER — Ambulatory Visit (HOSPITAL_COMMUNITY)
Admission: RE | Admit: 2016-12-09 | Discharge: 2016-12-09 | Disposition: A | Discharge status: Home / Self Care | Payer: PPO | Source: Ambulatory Visit | Attending: Internal Medicine | Admitting: Internal Medicine

## 2016-12-09 DIAGNOSIS — I700 Atherosclerosis of aorta: Secondary | ICD-10-CM | POA: Insufficient documentation

## 2016-12-09 DIAGNOSIS — M47894 Other spondylosis, thoracic region: Secondary | ICD-10-CM | POA: Insufficient documentation

## 2016-12-09 DIAGNOSIS — I2699 Other pulmonary embolism without acute cor pulmonale: Secondary | ICD-10-CM | POA: Insufficient documentation

## 2016-12-09 DIAGNOSIS — R911 Solitary pulmonary nodule: Secondary | ICD-10-CM | POA: Diagnosis not present

## 2016-12-09 DIAGNOSIS — R7989 Other specified abnormal findings of blood chemistry: Secondary | ICD-10-CM | POA: Diagnosis not present

## 2016-12-09 DIAGNOSIS — M7989 Other specified soft tissue disorders: Secondary | ICD-10-CM | POA: Diagnosis not present

## 2016-12-09 DIAGNOSIS — S72002S Fracture of unspecified part of neck of left femur, sequela: Secondary | ICD-10-CM | POA: Diagnosis not present

## 2016-12-09 DIAGNOSIS — Z86718 Personal history of other venous thrombosis and embolism: Secondary | ICD-10-CM | POA: Diagnosis not present

## 2016-12-09 DIAGNOSIS — R791 Abnormal coagulation profile: Secondary | ICD-10-CM | POA: Diagnosis not present

## 2016-12-09 MED ORDER — IOPAMIDOL (ISOVUE-370) INJECTION 76%
100.0000 mL | Freq: Once | INTRAVENOUS | Status: AC | PRN
  Administered 2016-12-09: 100 mL via INTRAVENOUS

## 2016-12-10 ENCOUNTER — Encounter: Payer: Self-pay | Admitting: Internal Medicine

## 2016-12-10 ENCOUNTER — Encounter (HOSPITAL_COMMUNITY)
Admission: RE | Admit: 2016-12-10 | Discharge: 2016-12-10 | Disposition: A | Discharge status: Home / Self Care | Payer: PPO | Source: Skilled Nursing Facility | Attending: Internal Medicine | Admitting: Internal Medicine

## 2016-12-10 DIAGNOSIS — E039 Hypothyroidism, unspecified: Secondary | ICD-10-CM | POA: Insufficient documentation

## 2016-12-10 DIAGNOSIS — Z4789 Encounter for other orthopedic aftercare: Secondary | ICD-10-CM | POA: Insufficient documentation

## 2016-12-10 DIAGNOSIS — Z9181 History of falling: Secondary | ICD-10-CM | POA: Insufficient documentation

## 2016-12-10 DIAGNOSIS — S72012D Unspecified intracapsular fracture of left femur, subsequent encounter for closed fracture with routine healing: Secondary | ICD-10-CM | POA: Insufficient documentation

## 2016-12-10 DIAGNOSIS — X58XXXD Exposure to other specified factors, subsequent encounter: Secondary | ICD-10-CM | POA: Insufficient documentation

## 2016-12-10 DIAGNOSIS — N183 Chronic kidney disease, stage 3 (moderate): Secondary | ICD-10-CM | POA: Insufficient documentation

## 2016-12-10 LAB — CBC WITH DIFFERENTIAL/PLATELET
BASOS ABS: 0 10*3/uL (ref 0.0–0.1)
BASOS PCT: 0 %
EOS PCT: 1 %
Eosinophils Absolute: 0.1 10*3/uL (ref 0.0–0.7)
HEMATOCRIT: 29.3 % — AB (ref 36.0–46.0)
HEMOGLOBIN: 9.4 g/dL — AB (ref 12.0–15.0)
LYMPHS ABS: 1.3 10*3/uL (ref 0.7–4.0)
LYMPHS PCT: 12 %
MCH: 26.3 pg (ref 26.0–34.0)
MCHC: 32.1 g/dL (ref 30.0–36.0)
MCV: 81.8 fL (ref 78.0–100.0)
MONO ABS: 0.9 10*3/uL (ref 0.1–1.0)
Monocytes Relative: 9 %
Neutro Abs: 8.6 10*3/uL — ABNORMAL HIGH (ref 1.7–7.7)
Neutrophils Relative %: 78 %
Platelets: 738 10*3/uL — ABNORMAL HIGH (ref 150–400)
RBC: 3.58 MIL/uL — AB (ref 3.87–5.11)
RDW: 14.6 % (ref 11.5–15.5)
WBC: 10.9 10*3/uL — ABNORMAL HIGH (ref 4.0–10.5)

## 2016-12-10 LAB — BASIC METABOLIC PANEL
ANION GAP: 9 (ref 5–15)
BUN: 21 mg/dL — ABNORMAL HIGH (ref 6–20)
CHLORIDE: 99 mmol/L — AB (ref 101–111)
CO2: 28 mmol/L (ref 22–32)
Calcium: 8.4 mg/dL — ABNORMAL LOW (ref 8.9–10.3)
Creatinine, Ser: 0.58 mg/dL (ref 0.44–1.00)
GFR calc non Af Amer: >60 mL/min (ref >60)
Glucose, Bld: 117 mg/dL — ABNORMAL HIGH (ref 65–99)
POTASSIUM: 3.5 mmol/L (ref 3.5–5.1)
Sodium: 136 mmol/L (ref 135–145)

## 2016-12-10 LAB — TSH: TSH: 4.25 u[IU]/mL (ref 0.350–4.500)

## 2016-12-10 NOTE — Progress Notes (Signed)
This encounter was created in error - please disregard.

## 2016-12-10 NOTE — Progress Notes (Signed)
Location:   [facility] Room Number: 124/P Place of Service:  SNF [phone]) Provider:  Fernand Parkins, MD  Patient Care Team: Celene Squibb, MD as PCP - General (Internal Medicine)  Extended Emergency Contact Information Primary Emergency Contact: Daniels,Rhiannon Address: [address]          [place], [place] [address] [place] Phone: [phone] Relation: Grandaughter Secondary Emergency Contact: Chretien,Barney Address: [address]          [place], [place] [address] [place] Phone: [phone] Mobile Phone: [phone] Relation: Spouse  Code Status:  DNR Goals of care: Advanced Directive information Advanced Directives 12/10/2016  Does Patient Have a Medical Advance Directive? Yes  Type of Advance Directive Out of facility DNR (pink MOST or yellow form)  Does patient want to make changes to medical advance directive? No - Patient declined  Copy of [place] in Chart? -     Chief Complaint  Patient presents with  . Acute Visit    F/U for Left leg edema, pulmonary embolism    HPI:  Pt is a 82 y.o. female seen today for an acute visit forHistory of left leg edema.  Patient does have a recent history of a left hip fracture with repair-she had increased left lower extremity edema and she was sent to the ER --she did receive an x-ray which was negative for any acute process.  Doppler was not available at that times but her d-dimer was elevated show she was put on Lovenox empirically and then orders to attain a Doppler when it is available.  Doppler subsequently was done today-which was negative for an acute left lower extremity DVT-there was suspicions there was some sequela from prior DVT  Since the Doppler was negative for DVT in the d-dimer was as noted above elevated at 6.06-we did obtain a CT angiogram of the chest which did show. A small pulmonary embolism in the right lower lobe  there was an acute lobar lower segmental and subsegmental pulmonary embolism isolated to the right lower lobe no central pulmonary emboli normal main pulmonary artery diameter.  Subsequently we will continue her Lovenox-clinically she appears to be stable she is not complaining of any chest pain or shortness of breath vital signs appear to be stable     Past Medical History:  Diagnosis Date  . Diabetes mellitus   . Diagnosis unknown   . High cholesterol   . Hypertension    Past Surgical History:  Procedure Laterality Date  . EYE SURGERY    . HIP PINNING,CANNULATED Right 03/20/2016   Procedure: INSERT SCREWS RIGHT HIP-CANNULATED PINNING RIGHT HIP;  Surgeon: Carole Civil, MD;  Location: [facility];  Service: Orthopedics;  Laterality: Right;  . HIP PINNING,CANNULATED Left 11/27/2016   Procedure: LEFT HIP PINNING;  Surgeon: Carole Civil, MD;  Location: [facility];  Service: Orthopedics;  Laterality: Left;    No Known Allergies  Current Outpatient Prescriptions on File Prior to Visit  Medication Sig Dispense Refill  . acetaminophen (TYLENOL) 500 MG chewable tablet Chew 500 mg by mouth 2 (two) times daily.     Marland Kitchen aspirin EC 325 MG EC tablet Take 1 tablet (325 mg total) by mouth daily with breakfast. 30 tablet 0  . calcium-vitamin D (OSCAL WITH D) 500-200 MG-UNIT tablet Take 1 tablet by mouth 2 (two) times daily.    . cloNIDine (CATAPRES - DOSED IN MG/24 HR) 0.2 mg/24hr  patch Place 1 patch (0.2 mg total) onto the skin once a week. 4 patch 12  . donepezil (ARICEPT) 10 MG tablet Take 10 mg by mouth at bedtime.    . ferrous sulfate 325 (65 FE) MG tablet Take 325 mg by mouth daily with breakfast.    . LORazepam (ATIVAN) 0.5 MG tablet Take 0.5 tablets (0.25 mg total) by mouth every 8 (eight) hours as needed for anxiety. 45 tablet 1  . polyethylene glycol (MIRALAX / GLYCOLAX) packet Take 17 g by mouth daily. 14 each 0  . traMADol (ULTRAM) 50 MG tablet Take 1 tablet (50 mg total) by mouth every  6 (six) hours. 30 tablet 0   No current facility-administered medications on file prior to visit.   OF NOTE--she is on Lovenox 40 mg twice a day   Review of Systems   This is quite limited secondary to dementia again she is denying any shortness of breath or chest pain appears to be comfortable    Immunization History  Administered Date(s) Administered  . Tdap 07/20/2011   Pertinent  Health Maintenance Due  Topic Date Due  . HEMOGLOBIN A1C  [birth year]1925  . FOOT EXAM  02/08/1935  . OPHTHALMOLOGY EXAM  02/08/1935  . URINE MICROALBUMIN  02/08/1935  . DEXA SCAN  02/08/1990  . INFLUENZA VACCINE  11/14/2017 (Originally 07/15/2016)  . PNA vac Low Risk Adult (1 of 2 - PCV13) 11/14/2017 (Originally 02/08/1990)   No flowsheet data found. Functional Status Survey:    Vitals:   12/10/16 1444  BP: 130/73  Pulse: 68  Resp: 18  Temp: 98.7 F (37.1 C)  TempSrc: Oral    Physical Exam   In general this is a somewhat frail elderly female in no distress she is bright and alert does not look to be in any distress.  Her skin is warm and dry.  Eyes pupils appear reactive light visual acuity appears grossly intact.  Oropharynx is clear mucous membranes appear fairly moist.  Chest she has poor respiratory effort but I cannot appreciate any overt congestion she did not really follow verbal command.  Heart is regular rate and rhythm she has one plus edema left leg there is a positive pedal pulse does not appear to be acutely tender or erythematous or warm.  Abdomen is soft nontender positive bowel sounds.  Muscle skeletal-she moves extremities 4 although certainly limited left leg secondary to the recent surgery.  Neurologic is grossly intact no lateralizing findings her speech is clear.  She is alert smiling makes eye contact  Labs reviewed:  Recent Labs  12/03/16 0700 12/08/16 1212 12/10/16 0700  NA 137 137 136  K 3.4* 3.5 3.5  CL 104 101 99*  CO2 27 29 28   GLUCOSE 105*  156* 117*  BUN 25* 32* 21*  CREATININE 0.62 0.76 0.58  CALCIUM 8.3* 8.5* 8.4*    Recent Labs  03/20/16 0030  AST 45*  ALT 22  ALKPHOS 76  BILITOT 1.0  PROT 6.5  ALBUMIN 3.7    Recent Labs  11/26/16 0906  12/03/16 0700 12/08/16 1212 12/10/16 0700  WBC 15.0*  < > 13.4* 13.1* 10.9*  NEUTROABS 13.0*  --  10.3*  --  8.6*  HGB 10.6*  < > 9.5* 8.8* 9.4*  HCT 32.4*  < > 29.0* 28.0* 29.3*  MCV 79.4  < > 80.6 82.1 81.8  PLT 554*  < > 574* 639* 738*  < > = values in this interval not displayed. Lab Results  Component Value Date   TSH 4.250 12/10/2016   No results found for: HGBA1C No results found for: CHOL, HDL, LDLCALC, LDLDIRECT, TRIG, CHOLHDL  Significant Diagnostic Results in last 30 days:  Dg Chest 1 View  Result Date: 11/26/2016 CLINICAL DATA:  Pain following fall EXAM: CHEST 1 VIEW COMPARISON:  March 19, 2016 FINDINGS: Lungs are clear. Heart size and pulmonary vascularity are normal. No adenopathy. There is atherosclerotic calcification in the aorta. No evident bone lesions. No pneumothorax. IMPRESSION: Aortic atherosclerosis.  No edema or consolidation. Electronically Signed   By: Lowella Grip III M.D.   On: 11/26/2016 09:41   Ct Angio Chest Pe W Or Wo Contrast  Result Date: 12/09/2016 CLINICAL DATA:  Elevated D-dimer. EXAM: CT ANGIOGRAPHY CHEST WITH CONTRAST TECHNIQUE: Multidetector CT imaging of the chest was performed using the standard protocol during bolus administration of intravenous contrast. Multiplanar CT image reconstructions and MIPs were obtained to evaluate the vascular anatomy. CONTRAST:  100 cc Isovue 370 IV. COMPARISON:  11/26/2016 chest radiograph. FINDINGS: Partially motion degraded scan. Cardiovascular: The study is high quality for the evaluation of pulmonary embolism. No filling defects in the main or central right or left pulmonary arteries. There are acute pulmonary emboli in the lobar, segmental and subsegmental branches of the right lower  lobe. No additional filling defects in the pulmonary arterial tree. Thoracic aortic atherosclerosis. Ectasia of the ascending thoracic aorta, maximum diameter 4.2 cm. Aberrant nonaneurysmal right subclavian artery arising from the distal aortic arch with retroesophageal course. Normal caliber main pulmonary artery (1.7 cm diameter). Normal heart size. RV / LV ratio 0.89. No significant pericardial fluid/thickening. Left main, left anterior descending and right coronary atherosclerosis. Mediastinum/Nodes: Hypodense 1.2 cm left thyroid lobe nodule. Unremarkable esophagus. No pathologically enlarged axillary, mediastinal or hilar lymph nodes. Lungs/Pleura: No pneumothorax. No pleural effusion. Subpleural 3 mm solid peripheral left upper lobe pulmonary nodule (series 8/ image 39). Hypoventilatory changes in the dependent lower lobes. No acute consolidative airspace disease, lung masses or additional significant pulmonary nodules. Upper abdomen: Simple 1.5 cm left liver lobe cyst. Musculoskeletal: No aggressive appearing focal osseous lesions. Mild to moderate thoracic spondylosis. Review of the MIP images confirms the above findings. IMPRESSION: 1. Acute lobar, segmental and subsegmental pulmonary embolism isolated to the right lower lobe. No central pulmonary emboli. Normal main pulmonary artery diameter. RV / LV ratio is not elevated. 2. Aortic atherosclerosis. Ectatic ascending thoracic aorta, maximum diameter 4.2 cm. 3. Aberrant nonaneurysmal right subclavian artery arising from the distal aortic arch with retroesophageal course. 4. Solitary subpleural 3 mm left upper lobe pulmonary nodule, probably benign. No follow-up needed if patient is low-risk. Non-contrast chest CT can be considered in 12 months if patient is high-risk. This recommendation follows the consensus statement: Guidelines for Management of Incidental Pulmonary Nodules Detected on CT Images: From the Fleischner Society 2017; Radiology 2017;  [id number]. These results were called by telephone at the time of interpretation on 12/09/2016 at 5:08 pm to Dr. Inocencio Homes, who verbally acknowledged these results. Electronically Signed   By: Ilona Sorrel M.D.   On: 12/09/2016 17:26   US Venous Img Lower Unilateral Left  Result Date: 12/09/2016 CLINICAL DATA:  Left lower extremity swelling for 2 weeks. Recent left hip fracture status postoperative fixation 11/28/2016 EXAM: LEFT LOWER EXTREMITY VENOUS DOPPLER ULTRASOUND TECHNIQUE: Gray-scale sonography with graded compression, as well as color Doppler and duplex ultrasound were performed to evaluate the lower extremity deep venous systems from the level of the common femoral vein and  including the common femoral, femoral, profunda femoral, popliteal and calf veins including the posterior tibial, peroneal and gastrocnemius veins when visible. The superficial great saphenous vein was also interrogated. Spectral Doppler was utilized to evaluate flow at rest and with distal augmentation maneuvers in the common femoral, femoral and popliteal veins. COMPARISON:  None. FINDINGS: Contralateral Common Femoral Vein: Respiratory phasicity is normal and symmetric with the symptomatic side. No evidence of thrombus. Normal compressibility. Common Femoral Vein: Slight wall thickening and small diameter without acute DVT. Suspect sequelae from prior DVT. Saphenofemoral Junction: No evidence of thrombus. Normal compressibility and flow on color Doppler imaging. Profunda Femoral Vein: Similar wall thickening and small diameter without acute thrombus. Suspect sequelae from prior DVT. Femoral Vein: Proximal femoral vein also demonstrates wall thickening and small diameter without thrombus. Compatible with sequelae from prior DVT. Mid and distal femoral vein appears patent and normal. Popliteal Vein: No evidence of thrombus. Normal compressibility, respiratory phasicity and response to augmentation. Calf Veins: No evidence of  thrombus. Normal compressibility and flow on color Doppler imaging. Superficial Great Saphenous Vein: No evidence of thrombus. Normal compressibility and flow on color Doppler imaging. Venous Reflux:  None. Other Findings:  None. IMPRESSION: Negative for acute left lower extremity DVT. Wall thickening and small diameter of the left common femoral, proximal profunda femoral and proximal femoral veins. Suspect sequelae from prior DVT. Electronically Signed   By: Jerilynn Mages.  Shick M.D.   On: 12/09/2016 10:59   Dg Hip Operative Unilat With Pelvis Left  Result Date: 11/28/2016 CLINICAL DATA:  Left hip fracture. EXAM: OPERATIVE left HIP (WITH PELVIS IF PERFORMED) 6 VIEWS TECHNIQUE: Fluoroscopic spot image(s) were submitted for interpretation post-operatively. FLUOROSCOPY TIME:  1 minutes 21 seconds. Radiation exposure index:  10.92 mGy. COMPARISON:  Radiographs of November 26, 2016. FINDINGS: Six intraoperative fluoroscopic images of the left hip demonstrate surgical fixation of left femoral neck fracture. Good alignment of fracture components is noted. IMPRESSION: Status post surgical internal fixation of left femoral neck fracture. Electronically Signed   By: Marijo Conception, M.D.   On: 11/28/2016 08:13   Dg Hip Unilat W Or Wo Pelvis 2-3 Views Left  Result Date: 12/08/2016 CLINICAL DATA:  Dementia, lower extremity deformities, recent left hip fixation for a left femoral surgical neck fracture. EXAM: DG HIP (WITH OR WITHOUT PELVIS) 2-3V LEFT COMPARISON:  11/27/2016 FINDINGS: Bones are osteopenic. Degenerative changes noted of the lumbar sacral spine with a mild scoliosis. Bony pelvis appears intact. 3 left hip fixation screws traverse the left hip subcapital femoral neck fracture. Lateral skin staples noted. Grossly stable appearance. Remote operative fixation of the right hip as well. IMPRESSION: Stable appearance of the left hip subcapital femoral neck fracture status postoperative fixation. Limited exam because of  positioning. Other chronic and postoperative findings as above. Electronically Signed   By: Jerilynn Mages.  Shick M.D.   On: 12/08/2016 12:18   Dg Hip Unilat W Or Wo Pelvis 2-3 Views Left  Result Date: 11/26/2016 CLINICAL DATA:  Pain following fall EXAM: DG HIP (WITH OR WITHOUT PELVIS) 2-3V LEFT COMPARISON:  None. FINDINGS: Frontal pelvis as well as lateral left hip images were obtained. There is an impacted subcapital femoral neck fracture on the left. The patient has had previous surgery on the right with screws transfixing the proximal right femur. The tips of the screws are in the proximal right femoral head. There is an old healed fracture in the subcapital right femoral region. No other fractures. No dislocations. There is mild narrowing of  each hip joint. Bones are osteoporotic. IMPRESSION: Impacted subcapital femoral neck fracture on the left. Postoperative change right. No dislocation. Bones osteoporotic. Electronically Signed   By: Lowella Grip III M.D.   On: 11/26/2016 09:40    Assessment/Plan   #1 history of small pulmonary embolism right lower lobe--new problem---she will be continued on Lovenox 40 mg twice a day-for now-clinically she appears to be stable with no complaints of chest pain shortness of breath-will need to be monitored.  With vital signs pulse ox every shift.  #2 anemia hemoglobin is 8.8 she is on iron Will update this I note her white count is 13,100 which actually appears to be baseline with recent levels she does not exhibit any chest congestion shortness of breath increased cough fever chills or dysuria but this will have to be watched will update a CBC with differential to see if this is trending down.  #3 left leg edema this appears relatively baseline she does not appear to be really having significant pain at this time but this will have to be watched She does have Tylenol as needed.  #4 history of left hip fracture status post repair-at this point she has been  started on calcium supplementation she has worked some with therapy as well she is on anticoagulation now with Lovenox   [id number]-of note greater than 35 minutes spent assessing patient-reviewing her chart and investigational studies-and formulating a plan of care-including discussion with Dr. Sheppard Coil about the CT results          Oralia Manis, [place]

## 2016-12-11 LAB — HEMOGLOBIN A1C
Hgb A1c MFr Bld: 5.9 % — ABNORMAL HIGH (ref 4.8–5.6)
MEAN PLASMA GLUCOSE: 123 mg/dL

## 2016-12-14 ENCOUNTER — Emergency Department (HOSPITAL_COMMUNITY): Payer: PPO

## 2016-12-14 ENCOUNTER — Emergency Department (HOSPITAL_COMMUNITY)
Admission: EM | Admit: 2016-12-14 | Discharge: 2016-12-14 | Disposition: A | Discharge status: Home / Self Care | Payer: PPO | Attending: Emergency Medicine | Admitting: Emergency Medicine

## 2016-12-14 ENCOUNTER — Encounter (HOSPITAL_COMMUNITY)
Admission: RE | Admit: 2016-12-14 | Discharge: 2016-12-14 | Disposition: A | Discharge status: Home / Self Care | Payer: PPO | Source: Skilled Nursing Facility | Attending: *Deleted | Admitting: *Deleted

## 2016-12-14 ENCOUNTER — Encounter (HOSPITAL_COMMUNITY): Payer: Self-pay | Admitting: Emergency Medicine

## 2016-12-14 DIAGNOSIS — D72829 Elevated white blood cell count, unspecified: Secondary | ICD-10-CM

## 2016-12-14 DIAGNOSIS — N183 Chronic kidney disease, stage 3 (moderate): Secondary | ICD-10-CM | POA: Insufficient documentation

## 2016-12-14 DIAGNOSIS — Z79899 Other long term (current) drug therapy: Secondary | ICD-10-CM | POA: Diagnosis not present

## 2016-12-14 DIAGNOSIS — E039 Hypothyroidism, unspecified: Secondary | ICD-10-CM | POA: Insufficient documentation

## 2016-12-14 DIAGNOSIS — I129 Hypertensive chronic kidney disease with stage 1 through stage 4 chronic kidney disease, or unspecified chronic kidney disease: Secondary | ICD-10-CM | POA: Diagnosis not present

## 2016-12-14 DIAGNOSIS — F0390 Unspecified dementia without behavioral disturbance: Secondary | ICD-10-CM | POA: Diagnosis not present

## 2016-12-14 DIAGNOSIS — Z7901 Long term (current) use of anticoagulants: Secondary | ICD-10-CM | POA: Insufficient documentation

## 2016-12-14 DIAGNOSIS — E1122 Type 2 diabetes mellitus with diabetic chronic kidney disease: Secondary | ICD-10-CM | POA: Insufficient documentation

## 2016-12-14 HISTORY — DX: Unspecified dementia, unspecified severity, without behavioral disturbance, psychotic disturbance, mood disturbance, and anxiety: F03.90

## 2016-12-14 LAB — CBC WITH DIFFERENTIAL/PLATELET
Basophils Absolute: 0 10*3/uL (ref 0.0–0.1)
Basophils Relative: 0 %
EOS ABS: 0.1 10*3/uL (ref 0.0–0.7)
Eosinophils Relative: 0 %
HEMATOCRIT: 32.8 % — AB (ref 36.0–46.0)
HEMOGLOBIN: 10.3 g/dL — AB (ref 12.0–15.0)
LYMPHS ABS: 2.3 10*3/uL (ref 0.7–4.0)
Lymphocytes Relative: 16 %
MCH: 25.9 pg — AB (ref 26.0–34.0)
MCHC: 31.4 g/dL (ref 30.0–36.0)
MCV: 82.6 fL (ref 78.0–100.0)
MONOS PCT: 8 %
Monocytes Absolute: 1.1 10*3/uL — ABNORMAL HIGH (ref 0.1–1.0)
NEUTROS PCT: 76 %
Neutro Abs: 10.6 10*3/uL — ABNORMAL HIGH (ref 1.7–7.7)
Platelets: 840 10*3/uL — ABNORMAL HIGH (ref 150–400)
RBC: 3.97 MIL/uL (ref 3.87–5.11)
RDW: 15.1 % (ref 11.5–15.5)
WBC: 14 10*3/uL — ABNORMAL HIGH (ref 4.0–10.5)

## 2016-12-14 LAB — BASIC METABOLIC PANEL
Anion gap: 8 (ref 5–15)
BUN: 23 mg/dL — AB (ref 6–20)
CO2: 29 mmol/L (ref 22–32)
CREATININE: 0.69 mg/dL (ref 0.44–1.00)
Calcium: 8.6 mg/dL — ABNORMAL LOW (ref 8.9–10.3)
Chloride: 100 mmol/L — ABNORMAL LOW (ref 101–111)
GFR calc Af Amer: >60 mL/min (ref >60)
GLUCOSE: 113 mg/dL — AB (ref 65–99)
POTASSIUM: 3.9 mmol/L (ref 3.5–5.1)
SODIUM: 137 mmol/L (ref 135–145)

## 2016-12-14 LAB — URINALYSIS, ROUTINE W REFLEX MICROSCOPIC
Bacteria, UA: NONE SEEN
Bilirubin Urine: NEGATIVE
Bilirubin Urine: NEGATIVE
Glucose, UA: NEGATIVE mg/dL
Glucose, UA: NEGATIVE mg/dL
Hgb urine dipstick: NEGATIVE
Hgb urine dipstick: NEGATIVE
Ketones, ur: NEGATIVE mg/dL
Ketones, ur: 5 mg/dL — AB
Leukocytes, UA: NEGATIVE
Leukocytes, UA: NEGATIVE
Nitrite: NEGATIVE
Nitrite: NEGATIVE
PROTEIN: 30 mg/dL — AB
PROTEIN: 30 mg/dL — AB
SPECIFIC GRAVITY, URINE: 1.02 (ref 1.005–1.030)
Specific Gravity, Urine: 1.023 (ref 1.005–1.030)
pH: 5 (ref 5.0–8.0)
pH: 6 (ref 5.0–8.0)

## 2016-12-14 LAB — CBC
HCT: 28.8 % — ABNORMAL LOW (ref 36.0–46.0)
Hemoglobin: 9.2 g/dL — ABNORMAL LOW (ref 12.0–15.0)
MCH: 26.3 pg (ref 26.0–34.0)
MCHC: 31.9 g/dL (ref 30.0–36.0)
MCV: 82.3 fL (ref 78.0–100.0)
PLATELETS: 715 10*3/uL — AB (ref 150–400)
RBC: 3.5 MIL/uL — ABNORMAL LOW (ref 3.87–5.11)
RDW: 15.2 % (ref 11.5–15.5)
WBC: 12.8 10*3/uL — AB (ref 4.0–10.5)

## 2016-12-14 NOTE — Discharge Instructions (Signed)
Workup for the elevated platelets and white blood cells without any significant findings requiring admission. Chest x-ray negative urinalysis negative no evidence of any serious infection. Patient stable for discharge back to nursing facility and the rest of workup can be pursued from there.

## 2016-12-14 NOTE — ED Notes (Signed)
Per reference Lovenox can affect platelets

## 2016-12-14 NOTE — ED Notes (Signed)
Call to [facility] to [place], [place] with discharge instructions as Alden Benjamin, her nurse is at lunch at this time.   [facility] will arrange her return to the facility

## 2016-12-14 NOTE — ED Provider Notes (Signed)
[facility] DEPT Provider Note   CSN: [id number] Arrival date & time: 12/14/16  1442     History   Chief Complaint Chief Complaint  Patient presents with  . Abnormal Lab    HPI Paula [name] is a 82 y.o. female.  Patient sent in to [facility] for elevated white blood cell count. An elevated platelets. Review of her labs since her left hip surgery on December 14 without significant changes. Patient has dementia and not able to provide any historical information. Denies any complaints.      Past Medical History:  Diagnosis Date  . Dementia   . Diabetes mellitus   . Diagnosis unknown   . High cholesterol   . Hypertension     Patient Active Problem List   Diagnosis Date Noted  . Closed fracture of neck of left femur ([place])   . Fracture of femoral neck, left, closed ([place]) 11/26/2016  . Dementia 11/26/2016  . Femur fracture ([place]) 11/26/2016  . Essential hypertension 03/20/2016  . Controlled type 2 diabetes mellitus without complication, without long-term current use of insulin ([place]) 03/20/2016  . Hypothyroidism, acquired 03/20/2016  . Subcapital fracture of right hip ([place]) 03/20/2016  . CKD (chronic kidney disease) stage 3, GFR 30-59 ml/min 03/20/2016  . UTI (lower urinary tract infection) 03/20/2016  . Subcapital fracture of hip ([place]) 03/20/2016  . Hip fracture ([place]) 03/20/2016  . Uterovaginal prolapse, complete 06/06/2013    Past Surgical History:  Procedure Laterality Date  . EYE SURGERY    . HIP PINNING,CANNULATED Right 03/20/2016   Procedure: INSERT SCREWS RIGHT HIP-CANNULATED PINNING RIGHT HIP;  Surgeon: Carole Civil, MD;  Location: [facility];  Service: Orthopedics;  Laterality: Right;  . HIP PINNING,CANNULATED Left 11/27/2016   Procedure: LEFT HIP PINNING;  Surgeon: Carole Civil, MD;  Location: [facility];  Service: Orthopedics;  Laterality: Left;    OB History    No data available       Home Medications    Prior to Admission medications     Medication Sig Start Date End Date Taking? Authorizing Provider  acetaminophen (TYLENOL) 500 MG chewable tablet Chew 500 mg by mouth 2 (two) times daily.    Yes Historical Provider, MD  aspirin EC 325 MG EC tablet Take 1 tablet (325 mg total) by mouth daily with breakfast. 03/24/16  Yes Kathie Dike, MD  calcium-vitamin D (OSCAL WITH D) 500-200 MG-UNIT tablet Take 1 tablet by mouth 2 (two) times daily.   Yes Historical Provider, MD  cloNIDine (CATAPRES - DOSED IN MG/24 HR) 0.2 mg/24hr patch Place 1 patch (0.2 mg total) onto the skin once a week. 12/07/16  Yes Orvan Falconer, MD  donepezil (ARICEPT) 10 MG tablet Take 10 mg by mouth at bedtime.   Yes Historical Provider, MD  enoxaparin (LOVENOX) 40 MG/0.4ML injection Inject 40 mg into the skin every 12 (twelve) hours.   Yes Historical Provider, MD  ferrous sulfate 325 (65 FE) MG tablet Take 325 mg by mouth daily with breakfast.   Yes Historical Provider, MD  LORazepam (ATIVAN) 0.5 MG tablet Take 0.5 tablets (0.25 mg total) by mouth every 8 (eight) hours as needed for anxiety. 12/03/16  Yes Gildardo Cranker, DO  polyethylene glycol (MIRALAX / GLYCOLAX) packet Take 17 g by mouth daily. 03/24/16  Yes Kathie Dike, MD  traMADol (ULTRAM) 50 MG tablet Take 1 tablet (50 mg total) by mouth every 6 (six) hours. 12/01/16  Yes Orvan Falconer, MD    Family History Family History  Problem Relation Age of Onset  . Hypertension Son   . Hypertension Grandchild     Social History Social History  Substance Use Topics  . Smoking status: Never Smoker  . Smokeless tobacco: Never Used  . Alcohol use No     Allergies   Patient has no known allergies.   Review of Systems Review of Systems  Unable to perform ROS: Dementia     Physical Exam Updated Vital Signs BP 147/72   Pulse 99   Resp 16   Ht 5\' 2"  (1.575 m)   Wt 44.9 kg   SpO2 99%   BMI 18.11 kg/m   Physical Exam  Constitutional: She appears well-developed and well-nourished. No distress.  HENT:   Head: Normocephalic and atraumatic.  Mouth/Throat: Oropharynx is clear and moist.  Eyes: Conjunctivae and EOM are normal. Pupils are equal, round, and reactive to light.  Neck: Normal range of motion. Neck supple.  Cardiovascular: Normal rate, regular rhythm and normal heart sounds.   Pulmonary/Chest: Effort normal and breath sounds normal. No respiratory distress.  Abdominal: Soft. Bowel sounds are normal.  Musculoskeletal: Normal range of motion. She exhibits no edema.  Left hip wound healing well no evidence of infection.  Neurological: She is alert. No cranial nerve deficit.  Skin: Skin is warm.  Nursing note and vitals reviewed.    ED Treatments / Results  Labs (all labs ordered are listed, but only abnormal results are displayed) Labs Reviewed  CBC - Abnormal; Notable for the following:       Result Value   WBC 12.8 (*)    RBC 3.50 (*)    Hemoglobin 9.2 (*)    HCT 28.8 (*)    Platelets 715 (*)    All other components within normal limits  BASIC METABOLIC PANEL - Abnormal; Notable for the following:    Chloride 100 (*)    Glucose, Bld 113 (*)    BUN 23 (*)    Calcium 8.6 (*)    All other components within normal limits  URINALYSIS, ROUTINE W REFLEX MICROSCOPIC - Abnormal; Notable for the following:    Ketones, ur 5 (*)    Protein, ur 30 (*)    All other components within normal limits    EKG  EKG Interpretation None       Radiology Dg Chest 1 View  Result Date: 12/14/2016 CLINICAL DATA:  Leukocytosis. EXAM: CHEST 1 VIEW COMPARISON:  November 26, 2016 FINDINGS: The heart size and mediastinal contours are stable. There is no focal infiltrate, pulmonary edema, or pleural effusion. The visualized skeletal structures are unremarkable. IMPRESSION: No active cardiopulmonary disease. Electronically Signed   By: Abelardo Diesel M.D.   On: 12/14/2016 16:49    Procedures Procedures (including critical care time)  Medications Ordered in ED Medications - No data to  display   Initial Impression / Assessment and Plan / ED Course  I have reviewed the triage vital signs and the nursing notes.  Pertinent labs & imaging results that were available during my care of the patient were reviewed by me and considered in my medical decision making (see chart for details).  Clinical Course     Patient referred in from [facility] for elevated white blood cell count and platelets. No real significant change from some previous labs. Patient's workup here today without evidence of any serious infection. Chest x-ray negative urinalysis negative and the left hip wound is healing well. No evidence of infection. Patient stable for discharge back to  [place] follow-up as per [facility] physicians.  Final Clinical Impressions(s) / ED Diagnoses   Final diagnoses:  Leukocytosis, unspecified type    New Prescriptions New Prescriptions   No medications on file     Fredia Sorrow, MD 12/14/16 1927

## 2016-12-14 NOTE — ED Triage Notes (Signed)
Pt sent from [facility] for evaluation of elevated PLT and WBC.

## 2016-12-15 DIAGNOSIS — Z9181 History of falling: Secondary | ICD-10-CM | POA: Diagnosis not present

## 2016-12-15 DIAGNOSIS — R1312 Dysphagia, oropharyngeal phase: Secondary | ICD-10-CM | POA: Diagnosis not present

## 2016-12-15 DIAGNOSIS — E039 Hypothyroidism, unspecified: Secondary | ICD-10-CM | POA: Diagnosis not present

## 2016-12-15 DIAGNOSIS — S72012D Unspecified intracapsular fracture of left femur, subsequent encounter for closed fracture with routine healing: Secondary | ICD-10-CM | POA: Diagnosis not present

## 2016-12-15 DIAGNOSIS — R293 Abnormal posture: Secondary | ICD-10-CM | POA: Diagnosis not present

## 2016-12-15 DIAGNOSIS — I10 Essential (primary) hypertension: Secondary | ICD-10-CM | POA: Diagnosis not present

## 2016-12-15 DIAGNOSIS — E785 Hyperlipidemia, unspecified: Secondary | ICD-10-CM | POA: Diagnosis not present

## 2016-12-15 DIAGNOSIS — M6281 Muscle weakness (generalized): Secondary | ICD-10-CM | POA: Diagnosis not present

## 2016-12-15 DIAGNOSIS — Z4789 Encounter for other orthopedic aftercare: Secondary | ICD-10-CM | POA: Diagnosis not present

## 2016-12-15 DIAGNOSIS — N183 Chronic kidney disease, stage 3 (moderate): Secondary | ICD-10-CM | POA: Diagnosis not present

## 2016-12-15 DIAGNOSIS — E119 Type 2 diabetes mellitus without complications: Secondary | ICD-10-CM | POA: Diagnosis not present

## 2016-12-15 DIAGNOSIS — R279 Unspecified lack of coordination: Secondary | ICD-10-CM | POA: Diagnosis not present

## 2016-12-15 DIAGNOSIS — F0391 Unspecified dementia with behavioral disturbance: Secondary | ICD-10-CM | POA: Diagnosis not present

## 2016-12-16 ENCOUNTER — Encounter (HOSPITAL_COMMUNITY)
Admission: RE | Admit: 2016-12-16 | Discharge: 2016-12-16 | Disposition: A | Discharge status: Home / Self Care | Payer: PPO | Source: Skilled Nursing Facility | Attending: Internal Medicine | Admitting: Internal Medicine

## 2016-12-16 ENCOUNTER — Encounter: Payer: Self-pay | Admitting: Internal Medicine

## 2016-12-16 ENCOUNTER — Non-Acute Institutional Stay (SKILLED_NURSING_FACILITY): Payer: PPO | Admitting: Internal Medicine

## 2016-12-16 DIAGNOSIS — D473 Essential (hemorrhagic) thrombocythemia: Secondary | ICD-10-CM

## 2016-12-16 DIAGNOSIS — D72829 Elevated white blood cell count, unspecified: Secondary | ICD-10-CM | POA: Diagnosis not present

## 2016-12-16 DIAGNOSIS — Z86711 Personal history of pulmonary embolism: Secondary | ICD-10-CM | POA: Diagnosis not present

## 2016-12-16 DIAGNOSIS — D649 Anemia, unspecified: Secondary | ICD-10-CM

## 2016-12-16 DIAGNOSIS — I129 Hypertensive chronic kidney disease with stage 1 through stage 4 chronic kidney disease, or unspecified chronic kidney disease: Secondary | ICD-10-CM | POA: Insufficient documentation

## 2016-12-16 DIAGNOSIS — D75839 Thrombocytosis, unspecified: Secondary | ICD-10-CM

## 2016-12-16 DIAGNOSIS — N183 Chronic kidney disease, stage 3 (moderate): Secondary | ICD-10-CM | POA: Insufficient documentation

## 2016-12-16 LAB — CBC WITH DIFFERENTIAL/PLATELET
Basophils Absolute: 0 10*3/uL (ref 0.0–0.1)
Basophils Relative: 0 %
EOS PCT: 0 %
Eosinophils Absolute: 0 10*3/uL (ref 0.0–0.7)
HCT: 31.9 % — ABNORMAL LOW (ref 36.0–46.0)
Hemoglobin: 10 g/dL — ABNORMAL LOW (ref 12.0–15.0)
LYMPHS ABS: 1.4 10*3/uL (ref 0.7–4.0)
LYMPHS PCT: 14 %
MCH: 25.8 pg — ABNORMAL LOW (ref 26.0–34.0)
MCHC: 31.3 g/dL (ref 30.0–36.0)
MCV: 82.2 fL (ref 78.0–100.0)
MONO ABS: 0.9 10*3/uL (ref 0.1–1.0)
MONOS PCT: 8 %
Neutro Abs: 8 10*3/uL — ABNORMAL HIGH (ref 1.7–7.7)
Neutrophils Relative %: 78 %
PLATELETS: 694 10*3/uL — AB (ref 150–400)
RBC: 3.88 MIL/uL (ref 3.87–5.11)
RDW: 15.3 % (ref 11.5–15.5)
WBC: 10.3 10*3/uL (ref 4.0–10.5)

## 2016-12-16 LAB — BASIC METABOLIC PANEL
Anion gap: 10 (ref 5–15)
BUN: 26 mg/dL — AB (ref 6–20)
CO2: 24 mmol/L (ref 22–32)
Calcium: 8.8 mg/dL — ABNORMAL LOW (ref 8.9–10.3)
Chloride: 105 mmol/L (ref 101–111)
Creatinine, Ser: 0.67 mg/dL (ref 0.44–1.00)
GFR calc Af Amer: >60 mL/min (ref >60)
GLUCOSE: 112 mg/dL — AB (ref 65–99)
POTASSIUM: 4 mmol/L (ref 3.5–5.1)
Sodium: 139 mmol/L (ref 135–145)

## 2016-12-16 LAB — URINE CULTURE: Culture: NO GROWTH

## 2016-12-16 NOTE — Progress Notes (Signed)
Location:   [facility] Room Number: 124/P Place of Service:  SNF [phone]) Provider:  Fernand Parkins, MD  Patient Care Team: Celene Squibb, MD as PCP - General (Internal Medicine)  Extended Emergency Contact Information Primary Emergency Contact: Daniels,Rhiannon Address: [address]          [place], [place] [address] [place] Phone: [phone] Relation: Grandaughter Secondary Emergency Contact: Bittel,Barney Address: [address]          [place], [place] [address] Johnnette Litter of [place] Phone: [phone] Mobile Phone: [phone] Relation: Spouse  Code Status:  DNR Goals of care: Advanced Directive information Advanced Directives 12/16/2016  Does Patient Have a Medical Advance Directive? Yes  Type of Advance Directive Out of facility DNR (pink MOST or yellow form)  Does patient want to make changes to medical advance directive? No - Patient declined  Copy of [place] in Chart? -     Chief Complaint  Patient presents with  . Acute Visit    F/U from ER  . Thrombocytosis elevated white count  HPI:  Pt is a 82 y.o. female seen today for an acute visit for follow-up of thrombocytosis with elevated white count.  Patient had had some steadily rising platelet counts during her stay here-was actually up over 800,000 on one lab-white count also was elevated at 14,000 back on December 31.  .  She does have a history of a small pulmonary embolism is on Lovenox 40 mg twice a day.  She was sent to the ER where labs were more reassuring white count was 12,800 and platelet count was 715,000.  The also obtained a chest x-ray and urine culture which appears to be unremarkable  Currently she is afebrile resting in her wheelchair comfortably she is a poor historian secondary to dementia but nursing does not report any increase complaints of chest pain shortness of breath or discomfort  She is here for  rehabilitation after sustaining a left hip fracture that was repaired  Past Medical History:  Diagnosis Date  . Dementia   . Diabetes mellitus   . Diagnosis unknown   . High cholesterol   . Hypertension    Past Surgical History:  Procedure Laterality Date  . EYE SURGERY    . HIP PINNING,CANNULATED Right 03/20/2016   Procedure: INSERT SCREWS RIGHT HIP-CANNULATED PINNING RIGHT HIP;  Surgeon: Carole Civil, MD;  Location: [facility];  Service: Orthopedics;  Laterality: Right;  . HIP PINNING,CANNULATED Left 11/27/2016   Procedure: LEFT HIP PINNING;  Surgeon: Carole Civil, MD;  Location: [facility]S;  Service: Orthopedics;  Laterality: Left;    No Known Allergies  Allergies as of 12/16/2016   No Known Allergies     Medication List       Accurate as of 12/16/16 11:13 AM. Always use your most recent med list.          acetaminophen 500 MG chewable tablet Commonly known as:  TYLENOL Chew 500 mg by mouth 2 (two) times daily.   calcium-vitamin D 500-200 MG-UNIT tablet Commonly known as:  OSCAL WITH D Take 1 tablet by mouth 2 (two) times daily.   cloNIDine 0.2 mg/24hr patch Commonly known as:  CATAPRES - Dosed in mg/24 hr Place 1 patch (0.2 mg total) onto the skin once a week.   donepezil 10 MG tablet Commonly known as:  ARICEPT Take 10 mg by mouth at bedtime.   enoxaparin 40  MG/0.4ML injection Commonly known as:  LOVENOX Inject 40 mg into the skin every 12 (twelve) hours.   ferrous sulfate 325 (65 FE) MG tablet Take 325 mg by mouth daily with breakfast.   LORazepam 0.5 MG tablet Commonly known as:  ATIVAN Take 0.5 tablets (0.25 mg total) by mouth every 8 (eight) hours as needed for anxiety.   polyethylene glycol packet Commonly known as:  MIRALAX / GLYCOLAX Take 17 g by mouth daily.   traMADol 50 MG tablet Commonly known as:  ULTRAM Take 1 tablet (50 mg total) by mouth every 6 (six) hours.         Review of Systems   Essentially unattainable secondary to  dementia  Immunization History  Administered Date(s) Administered  . Tdap 07/20/2011   Pertinent  Health Maintenance Due  Topic Date Due  . FOOT EXAM  02/08/1933  . OPHTHALMOLOGY EXAM  02/08/1933  . URINE MICROALBUMIN  02/08/1933  . DEXA SCAN  02/08/1990  . INFLUENZA VACCINE  11/14/2017 (Originally 07/15/2016)  . PNA vac Low Risk Adult (1 of 2 - PCV13) 11/14/2017 (Originally 02/08/1990)  . HEMOGLOBIN A1C  06/10/2017   No flowsheet data found. Functional Status Survey:    Vitals:   12/16/16 1106  BP: (!) 163/78  Pulse: 98  Resp: 20  Temp: 97.9 F (36.6 C)  TempSrc: Oral  Of note manual blood pressure reading was 160/62-she is somewhat agitated per nursing staff blood pressures at times become elevated when she gets agitated I quite a few variabilities here ranging from 118/77-135/68 173/79 appears the blood pressuresspikes  might be largely agitation related this will have to be monitored There is no height or weight on file to calculate BMI. Physical Exam In general this is a somewhat frail elderly female in no distress she is bright and alert confused--somewhat agitated with exam  Her skin is warm and dry.  Eyes pupils appear reactive light visual acuity appears grossly intact.  Oropharynx is clear mucous membranes appear fairly moist.  Chest she has poor respiratory effort but I cannot appreciate any overt congestion she did not really follow verbal commands  Heart is regular rate and rhythm she has one plus edema left leg there is a positive pedal pulse does not appear to be acutely tender or erythematous or warm.  Abdomen is soft nontender positive bowel sounds.  Muscle skeletal-she moves extremities 4 although certainly limited left leg secondary to the recent surgery.  Neurologic is grossly intact no lateralizing findings her speech is clear.  She is alert smiling makes eye contact  Labs reviewed:  Recent Labs  12/10/16 0700 12/14/16 1530  12/16/16 0800  NA 136 137 139  K 3.5 3.9 4.0  CL 99* 100* 105  CO2 28 29 24   GLUCOSE 117* 113* 112*  BUN 21* 23* 26*  CREATININE 0.58 0.69 0.67  CALCIUM 8.4* 8.6* 8.8*    Recent Labs  03/20/16 0030  AST 45*  ALT 22  ALKPHOS 76  BILITOT 1.0  PROT 6.5  ALBUMIN 3.7    Recent Labs  12/10/16 0700 12/14/16 1100 12/14/16 1530 12/16/16 0800  WBC 10.9* 14.0* 12.8* 10.3  NEUTROABS 8.6* 10.6*  --  8.0*  HGB 9.4* 10.3* 9.2* 10.0*  HCT 29.3* 32.8* 28.8* 31.9*  MCV 81.8 82.6 82.3 82.2  PLT 738* 840* 715* 694*   Lab Results  Component Value Date   TSH 4.250 12/10/2016   Lab Results  Component Value Date   HGBA1C 5.9 (H) 12/10/2016  No results found for: CHOL, HDL, LDLCALC, LDLDIRECT, TRIG, CHOLHDL  Significant Diagnostic Results in last 30 days:  Dg Chest 1 View  Result Date: 12/14/2016 CLINICAL DATA:  Leukocytosis. EXAM: CHEST 1 VIEW COMPARISON:  November 26, 2016 FINDINGS: The heart size and mediastinal contours are stable. There is no focal infiltrate, pulmonary edema, or pleural effusion. The visualized skeletal structures are unremarkable. IMPRESSION: No active cardiopulmonary disease. Electronically Signed   By: Abelardo Diesel M.D.   On: 12/14/2016 16:49   Dg Chest 1 View  Result Date: 11/26/2016 CLINICAL DATA:  Pain following fall EXAM: CHEST 1 VIEW COMPARISON:  March 19, 2016 FINDINGS: Lungs are clear. Heart size and pulmonary vascularity are normal. No adenopathy. There is atherosclerotic calcification in the aorta. No evident bone lesions. No pneumothorax. IMPRESSION: Aortic atherosclerosis.  No edema or consolidation. Electronically Signed   By: Lowella Grip III M.D.   On: 11/26/2016 09:41   Ct Angio Chest Pe W Or Wo Contrast  Result Date: 12/09/2016 CLINICAL DATA:  Elevated D-dimer. EXAM: CT ANGIOGRAPHY CHEST WITH CONTRAST TECHNIQUE: Multidetector CT imaging of the chest was performed using the standard protocol during bolus administration of intravenous  contrast. Multiplanar CT image reconstructions and MIPs were obtained to evaluate the vascular anatomy. CONTRAST:  100 cc Isovue 370 IV. COMPARISON:  11/26/2016 chest radiograph. FINDINGS: Partially motion degraded scan. Cardiovascular: The study is high quality for the evaluation of pulmonary embolism. No filling defects in the main or central right or left pulmonary arteries. There are acute pulmonary emboli in the lobar, segmental and subsegmental branches of the right lower lobe. No additional filling defects in the pulmonary arterial tree. Thoracic aortic atherosclerosis. Ectasia of the ascending thoracic aorta, maximum diameter 4.2 cm. Aberrant nonaneurysmal right subclavian artery arising from the distal aortic arch with retroesophageal course. Normal caliber main pulmonary artery (1.7 cm diameter). Normal heart size. RV / LV ratio 0.89. No significant pericardial fluid/thickening. Left main, left anterior descending and right coronary atherosclerosis. Mediastinum/Nodes: Hypodense 1.2 cm left thyroid lobe nodule. Unremarkable esophagus. No pathologically enlarged axillary, mediastinal or hilar lymph nodes. Lungs/Pleura: No pneumothorax. No pleural effusion. Subpleural 3 mm solid peripheral left upper lobe pulmonary nodule (series 8/ image 39). Hypoventilatory changes in the dependent lower lobes. No acute consolidative airspace disease, lung masses or additional significant pulmonary nodules. Upper abdomen: Simple 1.5 cm left liver lobe cyst. Musculoskeletal: No aggressive appearing focal osseous lesions. Mild to moderate thoracic spondylosis. Review of the MIP images confirms the above findings. IMPRESSION: 1. Acute lobar, segmental and subsegmental pulmonary embolism isolated to the right lower lobe. No central pulmonary emboli. Normal main pulmonary artery diameter. RV / LV ratio is not elevated. 2. Aortic atherosclerosis. Ectatic ascending thoracic aorta, maximum diameter 4.2 cm. 3. Aberrant nonaneurysmal  right subclavian artery arising from the distal aortic arch with retroesophageal course. 4. Solitary subpleural 3 mm left upper lobe pulmonary nodule, probably benign. No follow-up needed if patient is low-risk. Non-contrast chest CT can be considered in 12 months if patient is high-risk. This recommendation follows the consensus statement: Guidelines for Management of Incidental Pulmonary Nodules Detected on CT Images: From the Fleischner Society 2017; Radiology 2017; 284:228-243. These results were called by telephone at the time of interpretation on 12/09/2016 at 5:08 pm to Dr. Inocencio Homes, who verbally acknowledged these results. Electronically Signed   By: Ilona Sorrel M.D.   On: 12/09/2016 17:26   US Venous Img Lower Unilateral Left  Result Date: 12/09/2016 CLINICAL DATA:  Left lower extremity  swelling for 2 weeks. Recent left hip fracture status postoperative fixation 11/28/2016 EXAM: LEFT LOWER EXTREMITY VENOUS DOPPLER ULTRASOUND TECHNIQUE: Gray-scale sonography with graded compression, as well as color Doppler and duplex ultrasound were performed to evaluate the lower extremity deep venous systems from the level of the common femoral vein and including the common femoral, femoral, profunda femoral, popliteal and calf veins including the posterior tibial, peroneal and gastrocnemius veins when visible. The superficial great saphenous vein was also interrogated. Spectral Doppler was utilized to evaluate flow at rest and with distal augmentation maneuvers in the common femoral, femoral and popliteal veins. COMPARISON:  None. FINDINGS: Contralateral Common Femoral Vein: Respiratory phasicity is normal and symmetric with the symptomatic side. No evidence of thrombus. Normal compressibility. Common Femoral Vein: Slight wall thickening and small diameter without acute DVT. Suspect sequelae from prior DVT. Saphenofemoral Junction: No evidence of thrombus. Normal compressibility and flow on color Doppler  imaging. Profunda Femoral Vein: Similar wall thickening and small diameter without acute thrombus. Suspect sequelae from prior DVT. Femoral Vein: Proximal femoral vein also demonstrates wall thickening and small diameter without thrombus. Compatible with sequelae from prior DVT. Mid and distal femoral vein appears patent and normal. Popliteal Vein: No evidence of thrombus. Normal compressibility, respiratory phasicity and response to augmentation. Calf Veins: No evidence of thrombus. Normal compressibility and flow on color Doppler imaging. Superficial Great Saphenous Vein: No evidence of thrombus. Normal compressibility and flow on color Doppler imaging. Venous Reflux:  None. Other Findings:  None. IMPRESSION: Negative for acute left lower extremity DVT. Wall thickening and small diameter of the left common femoral, proximal profunda femoral and proximal femoral veins. Suspect sequelae from prior DVT. Electronically Signed   By: Jerilynn Mages.  Shick M.D.   On: 12/09/2016 10:59   Dg Hip Operative Unilat With Pelvis Left  Result Date: 11/28/2016 CLINICAL DATA:  Left hip fracture. EXAM: OPERATIVE left HIP (WITH PELVIS IF PERFORMED) 6 VIEWS TECHNIQUE: Fluoroscopic spot image(s) were submitted for interpretation post-operatively. FLUOROSCOPY TIME:  1 minutes 21 seconds. Radiation exposure index:  10.92 mGy. COMPARISON:  Radiographs of November 26, 2016. FINDINGS: Six intraoperative fluoroscopic images of the left hip demonstrate surgical fixation of left femoral neck fracture. Good alignment of fracture components is noted. IMPRESSION: Status post surgical internal fixation of left femoral neck fracture. Electronically Signed   By: Marijo Conception, M.D.   On: 11/28/2016 08:13   Dg Hip Unilat W Or Wo Pelvis 2-3 Views Left  Result Date: 12/08/2016 CLINICAL DATA:  Dementia, lower extremity deformities, recent left hip fixation for a left femoral surgical neck fracture. EXAM: DG HIP (WITH OR WITHOUT PELVIS) 2-3V LEFT  COMPARISON:  11/27/2016 FINDINGS: Bones are osteopenic. Degenerative changes noted of the lumbar sacral spine with a mild scoliosis. Bony pelvis appears intact. 3 left hip fixation screws traverse the left hip subcapital femoral neck fracture. Lateral skin staples noted. Grossly stable appearance. Remote operative fixation of the right hip as well. IMPRESSION: Stable appearance of the left hip subcapital femoral neck fracture status postoperative fixation. Limited exam because of positioning. Other chronic and postoperative findings as above. Electronically Signed   By: Jerilynn Mages.  Shick M.D.   On: 12/08/2016 12:18   Dg Hip Unilat W Or Wo Pelvis 2-3 Views Left  Result Date: 11/26/2016 CLINICAL DATA:  Pain following fall EXAM: DG HIP (WITH OR WITHOUT PELVIS) 2-3V LEFT COMPARISON:  None. FINDINGS: Frontal pelvis as well as lateral left hip images were obtained. There is an impacted subcapital femoral neck fracture on  the left. The patient has had previous surgery on the right with screws transfixing the proximal right femur. The tips of the screws are in the proximal right femoral head. There is an old healed fracture in the subcapital right femoral region. No other fractures. No dislocations. There is mild narrowing of each hip joint. Bones are osteoporotic. IMPRESSION: Impacted subcapital femoral neck fracture on the left. Postoperative change right. No dislocation. Bones osteoporotic. Electronically Signed   By: Lowella Grip III M.D.   On: 11/26/2016 09:40    Assessment/Plan  #1-thrombocytosis-this appears to be moderating-and trending down we will update this first laboratory day next week clinically she appears to be stable.  #2 anemia this appears stable she is on iron supplementation hemoglobin of 10.0 shows relative stability.  #3 history of pulmonary embolism continues on Lovenox-at this point will monitor I did discuss this with Dr. Lyndel Safe suspect she would be a poor candidate on Xarelto's  secondary to fall risk  #4-leukocytosis-again this has normalized she does not have any fever or chills workup in the ER including chest x-ray and urine were negative again will update this in approximately a week   CPT [place], [place], [place]

## 2016-12-17 ENCOUNTER — Encounter: Payer: Self-pay | Admitting: Orthopedic Surgery

## 2016-12-17 ENCOUNTER — Ambulatory Visit: Payer: PPO

## 2016-12-17 ENCOUNTER — Ambulatory Visit (INDEPENDENT_AMBULATORY_CARE_PROVIDER_SITE_OTHER): Payer: PPO | Admitting: Orthopedic Surgery

## 2016-12-17 DIAGNOSIS — S72145D Nondisplaced intertrochanteric fracture of left femur, subsequent encounter for closed fracture with routine healing: Secondary | ICD-10-CM

## 2016-12-17 NOTE — Progress Notes (Signed)
Status post open treatment internal fixation of the hip  Chief Complaint  Patient presents with  . Follow-up    Left hip pinning, DOS 11/27/16    The patient has severe dementia she was in the hospital ER for workup for DVT and pulmonary embolus. X-ray was obtained which showed stable fixation cannulated screws left hip  The patient needs to have x-ray which I can follow by computer follow-up with me in about 6 weeks for another x-ray  Leg alignment looks good, hip flexion did not cause any pain.  Encounter Diagnosis  Name Primary?  . Closed nondisplaced intertrochanteric fracture of left femur with routine healing, subsequent encounter Yes

## 2016-12-18 ENCOUNTER — Non-Acute Institutional Stay (SKILLED_NURSING_FACILITY): Payer: PPO | Admitting: Internal Medicine

## 2016-12-18 ENCOUNTER — Encounter: Payer: Self-pay | Admitting: Internal Medicine

## 2016-12-18 DIAGNOSIS — G309 Alzheimer's disease, unspecified: Secondary | ICD-10-CM | POA: Diagnosis not present

## 2016-12-18 DIAGNOSIS — I10 Essential (primary) hypertension: Secondary | ICD-10-CM

## 2016-12-18 DIAGNOSIS — F0280 Dementia in other diseases classified elsewhere without behavioral disturbance: Secondary | ICD-10-CM

## 2016-12-18 DIAGNOSIS — I2699 Other pulmonary embolism without acute cor pulmonale: Secondary | ICD-10-CM | POA: Diagnosis not present

## 2016-12-18 DIAGNOSIS — S72002S Fracture of unspecified part of neck of left femur, sequela: Secondary | ICD-10-CM

## 2016-12-18 NOTE — Progress Notes (Signed)
Location:   [facility] Room Number: 124/P Place of Service:  SNF (31) Provider:  Marylen Ponto, MD  Patient Care Team: Celene Squibb, MD as PCP - General (Internal Medicine)  Extended Emergency Contact Information Primary Emergency Contact: Saren, [place], [place] [address] [place] of [place] Phone: [phone] Relation: Grandson Secondary Emergency Contact: Daniels,Rhiannon Address: [address]          [place], [place] [address] [place] Phone: [phone] Relation: Grandaughter  Code Status:  DNR Goals of care: Advanced Directive information Advanced Directives 12/18/2016  Does Patient Have a Medical Advance Directive? Yes  Type of Advance Directive Out of facility DNR (pink MOST or yellow form)  Does patient want to make changes to medical advance directive? No - Patient declined  Copy of Dawson in Chart? -     Chief Complaint  Patient presents with  . Acute Visit    Pulminary Embolism ( PE )    HPI:  Pt is a 83 y.o. female seen today for an acute visit for  Follow up on Her Acute Pulmonary Embolism.  Patient has Recent h/o Closed Left Femoral Neck Fracture S/P ORIF on 12/14 h/o dementia, Hypothyroidism, Uterovaginal Prolapse, Previous Right hip fracture in 04/17, Diabetes Mellitus not on any hypoglycemics. And Hypertension. She was found to have increased edema of Left leg. Her US of the leg showed signs of previous DVT but no acute process. But since her D Dimer was elevated CT scan of Chest was done. Which showed Acute lobar, segmental and subsegmental pulmonary embolism isolated to the right lower lobe. Also Showed isolated Left pulmonary Nodule. She was started on Lovenox.  She is completely Asymptomatic. She has severe dementia. And hard of hearing. But she has not been having any chest pain or SOB. Per nurses she just stays in the bed. No documented falls. She is eating  but not that well. Patient is unable to give me any history.   Past Medical History:  Diagnosis Date  . Dementia   . Diabetes mellitus   . Diagnosis unknown   . High cholesterol   . Hypertension    Past Surgical History:  Procedure Laterality Date  . EYE SURGERY    . HIP PINNING,CANNULATED Right 03/20/2016   Procedure: INSERT SCREWS RIGHT HIP-CANNULATED PINNING RIGHT HIP;  Surgeon: Carole Civil, MD;  Location: [facility];  Service: Orthopedics;  Laterality: Right;  . HIP PINNING,CANNULATED Left 11/27/2016   Procedure: LEFT HIP PINNING;  Surgeon: Carole Civil, MD;  Location: [facility];  Service: Orthopedics;  Laterality: Left;    No Known Allergies  Allergies as of 12/18/2016   No Known Allergies     Medication List       Accurate as of 12/18/16  1:56 PM. Always use your most recent med list.          acetaminophen 500 MG chewable tablet Commonly known as:  TYLENOL Chew 500 mg by mouth 2 (two) times daily.   calcium-vitamin D 500-200 MG-UNIT tablet Commonly known as:  OSCAL WITH D Take 1 tablet by mouth 2 (two) times daily.   cloNIDine 0.2 mg/24hr patch Commonly known as:  CATAPRES - Dosed in mg/24 hr Place 1 patch (0.2 mg total) onto the skin once a week.   donepezil 10 MG tablet Commonly known as:  ARICEPT Take 10 mg by mouth at bedtime.  enoxaparin 40 MG/0.4ML injection Commonly known as:  LOVENOX Inject 40 mg into the skin every 12 (twelve) hours.   ferrous sulfate 325 (65 FE) MG tablet Take 325 mg by mouth daily with breakfast.   LORazepam 0.5 MG tablet Commonly known as:  ATIVAN Take 0.5 tablets (0.25 mg total) by mouth every 8 (eight) hours as needed for anxiety.   polyethylene glycol packet Commonly known as:  MIRALAX / GLYCOLAX Take 17 g by mouth daily.   traMADol 50 MG tablet Commonly known as:  ULTRAM Take 1 tablet (50 mg total) by mouth every 6 (six) hours.       Review of Systems  Unable to perform ROS: Dementia    Immunization  History  Administered Date(s) Administered  . Tdap 07/20/2011   Pertinent  Health Maintenance Due  Topic Date Due  . FOOT EXAM  02/08/1933  . OPHTHALMOLOGY EXAM  02/08/1933  . URINE MICROALBUMIN  02/08/1933  . DEXA SCAN  02/08/1990  . INFLUENZA VACCINE  11/14/2017 (Originally 07/15/2016)  . PNA vac Low Risk Adult (1 of 2 - PCV13) 11/14/2017 (Originally 02/08/1990)  . HEMOGLOBIN A1C  06/10/2017   No flowsheet data found. Functional Status Survey:    Vitals:   12/18/16 1344  BP: (!) 142/81  Pulse: 89  Resp: 20  Temp: 97.6 F (36.4 C)  TempSrc: Oral   There is no height or weight on file to calculate BMI. Physical Exam  Constitutional: She appears well-developed. She appears cachectic.  Lying  Curled up in the bed.  HENT:  Head: Normocephalic.  Mouth/Throat: Oropharynx is clear and moist.  Eyes: Pupils are equal, round, and reactive to light.  Cardiovascular: Normal rate, regular rhythm and normal heart sounds.   Pulmonary/Chest: Effort normal and breath sounds normal. No respiratory distress. She has no wheezes. She has no rales.  Abdominal: Soft. Bowel sounds are normal. She exhibits no distension. There is no tenderness.  Musculoskeletal:  Edema in both legs but Left more then right.  Lymphadenopathy:    She has no cervical adenopathy.  Neurological: She is alert.  Skin: Skin is warm and dry. No rash noted. No erythema.  Scar healing well.  Psychiatric:  Not evaluated due to dementia. But per nurses she has not been agitated.    Labs reviewed:  Recent Labs  12/10/16 0700 12/14/16 1530 12/16/16 0800  NA 136 137 139  K 3.5 3.9 4.0  CL 99* 100* 105  CO2 28 29 24   GLUCOSE 117* 113* 112*  BUN 21* 23* 26*  CREATININE 0.58 0.69 0.67  CALCIUM 8.4* 8.6* 8.8*    Recent Labs  03/20/16 0030  AST 45*  ALT 22  ALKPHOS 76  BILITOT 1.0  PROT 6.5  ALBUMIN 3.7    Recent Labs  12/10/16 0700 12/14/16 1100 12/14/16 1530 12/16/16 0800  WBC 10.9* 14.0* 12.8*  10.3  NEUTROABS 8.6* 10.6*  --  8.0*  HGB 9.4* 10.3* 9.2* 10.0*  HCT 29.3* 32.8* 28.8* 31.9*  MCV 81.8 82.6 82.3 82.2  PLT 738* 840* 715* 694*   Lab Results  Component Value Date   TSH 4.250 12/10/2016   Lab Results  Component Value Date   HGBA1C 5.9 (H) 12/10/2016   No results found for: CHOL, HDL, LDLCALC, LDLDIRECT, TRIG, CHOLHDL  Significant Diagnostic Results in last 30 days:  Dg Chest 1 View  Result Date: 12/14/2016 CLINICAL DATA:  Leukocytosis. EXAM: CHEST 1 VIEW COMPARISON:  November 26, 2016 FINDINGS: The heart size and mediastinal contours  are stable. There is no focal infiltrate, pulmonary edema, or pleural effusion. The visualized skeletal structures are unremarkable. IMPRESSION: No active cardiopulmonary disease. Electronically Signed   By: Abelardo Diesel M.D.   On: 12/14/2016 16:49   Dg Chest 1 View  Result Date: 11/26/2016 CLINICAL DATA:  Pain following fall EXAM: CHEST 1 VIEW COMPARISON:  March 19, 2016 FINDINGS: Lungs are clear. Heart size and pulmonary vascularity are normal. No adenopathy. There is atherosclerotic calcification in the aorta. No evident bone lesions. No pneumothorax. IMPRESSION: Aortic atherosclerosis.  No edema or consolidation. Electronically Signed   By: Lowella Grip III M.D.   On: 11/26/2016 09:41   Ct Angio Chest Pe W Or Wo Contrast  Result Date: 12/09/2016 CLINICAL DATA:  Elevated D-dimer. EXAM: CT ANGIOGRAPHY CHEST WITH CONTRAST TECHNIQUE: Multidetector CT imaging of the chest was performed using the standard protocol during bolus administration of intravenous contrast. Multiplanar CT image reconstructions and MIPs were obtained to evaluate the vascular anatomy. CONTRAST:  100 cc Isovue 370 IV. COMPARISON:  11/26/2016 chest radiograph. FINDINGS: Partially motion degraded scan. Cardiovascular: The study is high quality for the evaluation of pulmonary embolism. No filling defects in the main or central right or left pulmonary arteries. There  are acute pulmonary emboli in the lobar, segmental and subsegmental branches of the right lower lobe. No additional filling defects in the pulmonary arterial tree. Thoracic aortic atherosclerosis. Ectasia of the ascending thoracic aorta, maximum diameter 4.2 cm. Aberrant nonaneurysmal right subclavian artery arising from the distal aortic arch with retroesophageal course. Normal caliber main pulmonary artery (1.7 cm diameter). Normal heart size. RV / LV ratio 0.89. No significant pericardial fluid/thickening. Left main, left anterior descending and right coronary atherosclerosis. Mediastinum/Nodes: Hypodense 1.2 cm left thyroid lobe nodule. Unremarkable esophagus. No pathologically enlarged axillary, mediastinal or hilar lymph nodes. Lungs/Pleura: No pneumothorax. No pleural effusion. Subpleural 3 mm solid peripheral left upper lobe pulmonary nodule (series 8/ image 39). Hypoventilatory changes in the dependent lower lobes. No acute consolidative airspace disease, lung masses or additional significant pulmonary nodules. Upper abdomen: Simple 1.5 cm left liver lobe cyst. Musculoskeletal: No aggressive appearing focal osseous lesions. Mild to moderate thoracic spondylosis. Review of the MIP images confirms the above findings. IMPRESSION: 1. Acute lobar, segmental and subsegmental pulmonary embolism isolated to the right lower lobe. No central pulmonary emboli. Normal main pulmonary artery diameter. RV / LV ratio is not elevated. 2. Aortic atherosclerosis. Ectatic ascending thoracic aorta, maximum diameter 4.2 cm. 3. Aberrant nonaneurysmal right subclavian artery arising from the distal aortic arch with retroesophageal course. 4. Solitary subpleural 3 mm left upper lobe pulmonary nodule, probably benign. No follow-up needed if patient is low-risk. Non-contrast chest CT can be considered in 12 months if patient is high-risk. This recommendation follows the consensus statement: Guidelines for Management of Incidental  Pulmonary Nodules Detected on CT Images: From the Fleischner Society 2017; Radiology 2017; 284:228-243. These results were called by telephone at the time of interpretation on 12/09/2016 at 5:08 pm to Dr. Inocencio Homes, who verbally acknowledged these results. Electronically Signed   By: Ilona Sorrel M.D.   On: 12/09/2016 17:26   US Venous Img Lower Unilateral Left  Result Date: 12/09/2016 CLINICAL DATA:  Left lower extremity swelling for 2 weeks. Recent left hip fracture status postoperative fixation 11/28/2016 EXAM: LEFT LOWER EXTREMITY VENOUS DOPPLER ULTRASOUND TECHNIQUE: Gray-scale sonography with graded compression, as well as color Doppler and duplex ultrasound were performed to evaluate the lower extremity deep venous systems from the level of  the common femoral vein and including the common femoral, femoral, profunda femoral, popliteal and calf veins including the posterior tibial, peroneal and gastrocnemius veins when visible. The superficial great saphenous vein was also interrogated. Spectral Doppler was utilized to evaluate flow at rest and with distal augmentation maneuvers in the common femoral, femoral and popliteal veins. COMPARISON:  None. FINDINGS: Contralateral Common Femoral Vein: Respiratory phasicity is normal and symmetric with the symptomatic side. No evidence of thrombus. Normal compressibility. Common Femoral Vein: Slight wall thickening and small diameter without acute DVT. Suspect sequelae from prior DVT. Saphenofemoral Junction: No evidence of thrombus. Normal compressibility and flow on color Doppler imaging. Profunda Femoral Vein: Similar wall thickening and small diameter without acute thrombus. Suspect sequelae from prior DVT. Femoral Vein: Proximal femoral vein also demonstrates wall thickening and small diameter without thrombus. Compatible with sequelae from prior DVT. Mid and distal femoral vein appears patent and normal. Popliteal Vein: No evidence of thrombus. Normal  compressibility, respiratory phasicity and response to augmentation. Calf Veins: No evidence of thrombus. Normal compressibility and flow on color Doppler imaging. Superficial Great Saphenous Vein: No evidence of thrombus. Normal compressibility and flow on color Doppler imaging. Venous Reflux:  None. Other Findings:  None. IMPRESSION: Negative for acute left lower extremity DVT. Wall thickening and small diameter of the left common femoral, proximal profunda femoral and proximal femoral veins. Suspect sequelae from prior DVT. Electronically Signed   By: Jerilynn Mages.  Shick M.D.   On: 12/09/2016 10:59   Dg Hip Operative Unilat With Pelvis Left  Result Date: 11/28/2016 CLINICAL DATA:  Left hip fracture. EXAM: OPERATIVE left HIP (WITH PELVIS IF PERFORMED) 6 VIEWS TECHNIQUE: Fluoroscopic spot image(s) were submitted for interpretation post-operatively. FLUOROSCOPY TIME:  1 minutes 21 seconds. Radiation exposure index:  10.92 mGy. COMPARISON:  Radiographs of November 26, 2016. FINDINGS: Six intraoperative fluoroscopic images of the left hip demonstrate surgical fixation of left femoral neck fracture. Good alignment of fracture components is noted. IMPRESSION: Status post surgical internal fixation of left femoral neck fracture. Electronically Signed   By: Marijo Conception, M.D.   On: 11/28/2016 08:13   Dg Hip Unilat W Or Wo Pelvis 2-3 Views Left  Result Date: 12/08/2016 CLINICAL DATA:  Dementia, lower extremity deformities, recent left hip fixation for a left femoral surgical neck fracture. EXAM: DG HIP (WITH OR WITHOUT PELVIS) 2-3V LEFT COMPARISON:  11/27/2016 FINDINGS: Bones are osteopenic. Degenerative changes noted of the lumbar sacral spine with a mild scoliosis. Bony pelvis appears intact. 3 left hip fixation screws traverse the left hip subcapital femoral neck fracture. Lateral skin staples noted. Grossly stable appearance. Remote operative fixation of the right hip as well. IMPRESSION: Stable appearance of the  left hip subcapital femoral neck fracture status postoperative fixation. Limited exam because of positioning. Other chronic and postoperative findings as above. Electronically Signed   By: Jerilynn Mages.  Shick M.D.   On: 12/08/2016 12:18   Dg Hip Unilat W Or Wo Pelvis 2-3 Views Left  Result Date: 11/26/2016 CLINICAL DATA:  Pain following fall EXAM: DG HIP (WITH OR WITHOUT PELVIS) 2-3V LEFT COMPARISON:  None. FINDINGS: Frontal pelvis as well as lateral left hip images were obtained. There is an impacted subcapital femoral neck fracture on the left. The patient has had previous surgery on the right with screws transfixing the proximal right femur. The tips of the screws are in the proximal right femoral head. There is an old healed fracture in the subcapital right femoral region. No other fractures. No dislocations.  There is mild narrowing of each hip joint. Bones are osteoporotic. IMPRESSION: Impacted subcapital femoral neck fracture on the left. Postoperative change right. No dislocation. Bones osteoporotic. Electronically Signed   By: Lowella Grip III M.D.   On: 11/26/2016 09:40    Assessment/Plan  Acute  pulmonary embolism without acute cor pulmonale  Patient did have segmental PE on CT scan after Hip surgery. Patient is asymptomatic but stays at very high Risk of having another episode as she continues to be Bed bound due to her dementia. She is at high risk of bleeding due to her h/o fall and frailty. And age. But at this time benefits of Anticoagulation outweighs the risk. We will start her on Xarelto 15 mg BID for 12 Days as she has received Loading dose of Lovenox since 12/26 and then switch to Xarelto 20 mg QD Continue fall precautions. Will d/w her granddaughter. As patient has very poor prognosis but to her dementia.  Essential hypertension BP still mildly elevated. Increase the Clonidine patch to 0.3 mg /hour  Closed fracture of neck of left femur Follows with Dr Luan Pulling  Anemia HGB  stable since surgery. On iron supplement Her White Couint is back to normal and Platelets trending down. Alzheimer's dementia  Continue Aricept Prognosis poor.   Family/ staff Communication: D/W Grand daughter about the risk and benefits of Xarelto.  Labs/tests ordered:  CBC and CMP to follow HGB in 1 week. Total time spent in this patient care encounter was 60_ minutes; greater than 50% of the visit spent counseling patient family and coordinating care with nurse  for problems addressed at this encounter.

## 2016-12-22 ENCOUNTER — Encounter (HOSPITAL_COMMUNITY)
Admission: RE | Admit: 2016-12-22 | Discharge: 2016-12-22 | Disposition: A | Discharge status: Home / Self Care | Payer: PPO | Source: Skilled Nursing Facility | Attending: Internal Medicine | Admitting: Internal Medicine

## 2016-12-22 LAB — BASIC METABOLIC PANEL
Anion gap: 7 (ref 5–15)
BUN: 23 mg/dL — AB (ref 6–20)
CALCIUM: 8.6 mg/dL — AB (ref 8.9–10.3)
CHLORIDE: 103 mmol/L (ref 101–111)
CO2: 29 mmol/L (ref 22–32)
Creatinine, Ser: 0.6 mg/dL (ref 0.44–1.00)
GFR calc Af Amer: >60 mL/min (ref >60)
GFR calc non Af Amer: >60 mL/min (ref >60)
Glucose, Bld: 108 mg/dL — ABNORMAL HIGH (ref 65–99)
Potassium: 3.2 mmol/L — ABNORMAL LOW (ref 3.5–5.1)
SODIUM: 139 mmol/L (ref 135–145)

## 2016-12-22 LAB — CBC WITH DIFFERENTIAL/PLATELET
BASOS PCT: 0 %
Basophils Absolute: 0 10*3/uL (ref 0.0–0.1)
EOS ABS: 0.1 10*3/uL (ref 0.0–0.7)
EOS PCT: 1 %
HCT: 30.4 % — ABNORMAL LOW (ref 36.0–46.0)
HEMOGLOBIN: 9.6 g/dL — AB (ref 12.0–15.0)
Lymphocytes Relative: 21 %
Lymphs Abs: 1.6 10*3/uL (ref 0.7–4.0)
MCH: 26.2 pg (ref 26.0–34.0)
MCHC: 31.6 g/dL (ref 30.0–36.0)
MCV: 82.8 fL (ref 78.0–100.0)
MONO ABS: 0.6 10*3/uL (ref 0.1–1.0)
MONOS PCT: 8 %
Neutro Abs: 5.4 10*3/uL (ref 1.7–7.7)
Neutrophils Relative %: 70 %
PLATELETS: 619 10*3/uL — AB (ref 150–400)
RBC: 3.67 MIL/uL — ABNORMAL LOW (ref 3.87–5.11)
RDW: 15.6 % — AB (ref 11.5–15.5)
WBC: 7.8 10*3/uL (ref 4.0–10.5)

## 2016-12-25 ENCOUNTER — Encounter (HOSPITAL_COMMUNITY)
Admission: RE | Admit: 2016-12-25 | Discharge: 2016-12-25 | Disposition: A | Discharge status: Home / Self Care | Payer: PPO | Source: Skilled Nursing Facility | Attending: Internal Medicine | Admitting: Internal Medicine

## 2016-12-25 LAB — BASIC METABOLIC PANEL
Anion gap: 9 (ref 5–15)
BUN: 26 mg/dL — ABNORMAL HIGH (ref 6–20)
CALCIUM: 8.5 mg/dL — AB (ref 8.9–10.3)
CO2: 22 mmol/L (ref 22–32)
CREATININE: 0.71 mg/dL (ref 0.44–1.00)
Chloride: 107 mmol/L (ref 101–111)
Glucose, Bld: 98 mg/dL (ref 65–99)
Potassium: 4.5 mmol/L (ref 3.5–5.1)
SODIUM: 138 mmol/L (ref 135–145)

## 2016-12-25 LAB — CBC
HEMATOCRIT: 31.3 % — AB (ref 36.0–46.0)
Hemoglobin: 9.9 g/dL — ABNORMAL LOW (ref 12.0–15.0)
MCH: 25.9 pg — ABNORMAL LOW (ref 26.0–34.0)
MCHC: 31.6 g/dL (ref 30.0–36.0)
MCV: 81.9 fL (ref 78.0–100.0)
PLATELETS: 565 10*3/uL — AB (ref 150–400)
RBC: 3.82 MIL/uL — ABNORMAL LOW (ref 3.87–5.11)
RDW: 16.2 % — AB (ref 11.5–15.5)
WBC: 10.4 10*3/uL (ref 4.0–10.5)

## 2016-12-29 ENCOUNTER — Non-Acute Institutional Stay (SKILLED_NURSING_FACILITY): Payer: PPO | Admitting: Internal Medicine

## 2016-12-29 ENCOUNTER — Encounter: Payer: Self-pay | Admitting: Internal Medicine

## 2016-12-29 ENCOUNTER — Encounter (HOSPITAL_COMMUNITY)
Admission: RE | Admit: 2016-12-29 | Discharge: 2016-12-29 | Disposition: A | Discharge status: Home / Self Care | Payer: PPO | Source: Skilled Nursing Facility | Attending: Internal Medicine | Admitting: Internal Medicine

## 2016-12-29 DIAGNOSIS — S72002S Fracture of unspecified part of neck of left femur, sequela: Secondary | ICD-10-CM | POA: Diagnosis not present

## 2016-12-29 DIAGNOSIS — I10 Essential (primary) hypertension: Secondary | ICD-10-CM

## 2016-12-29 DIAGNOSIS — E119 Type 2 diabetes mellitus without complications: Secondary | ICD-10-CM | POA: Diagnosis not present

## 2016-12-29 DIAGNOSIS — F0280 Dementia in other diseases classified elsewhere without behavioral disturbance: Secondary | ICD-10-CM

## 2016-12-29 DIAGNOSIS — N183 Chronic kidney disease, stage 3 (moderate): Secondary | ICD-10-CM | POA: Diagnosis not present

## 2016-12-29 DIAGNOSIS — G309 Alzheimer's disease, unspecified: Secondary | ICD-10-CM

## 2016-12-29 DIAGNOSIS — I129 Hypertensive chronic kidney disease with stage 1 through stage 4 chronic kidney disease, or unspecified chronic kidney disease: Secondary | ICD-10-CM | POA: Diagnosis not present

## 2016-12-29 DIAGNOSIS — I2699 Other pulmonary embolism without acute cor pulmonale: Secondary | ICD-10-CM | POA: Diagnosis not present

## 2016-12-29 LAB — CBC
HEMATOCRIT: 31.9 % — AB (ref 36.0–46.0)
HEMOGLOBIN: 10 g/dL — AB (ref 12.0–15.0)
MCH: 26 pg (ref 26.0–34.0)
MCHC: 31.3 g/dL (ref 30.0–36.0)
MCV: 82.9 fL (ref 78.0–100.0)
Platelets: 559 10*3/uL — ABNORMAL HIGH (ref 150–400)
RBC: 3.85 MIL/uL — ABNORMAL LOW (ref 3.87–5.11)
RDW: 16.8 % — ABNORMAL HIGH (ref 11.5–15.5)
WBC: 8.1 10*3/uL (ref 4.0–10.5)

## 2016-12-29 LAB — BASIC METABOLIC PANEL
ANION GAP: 7 (ref 5–15)
BUN: 23 mg/dL — ABNORMAL HIGH (ref 6–20)
CO2: 28 mmol/L (ref 22–32)
Calcium: 8.8 mg/dL — ABNORMAL LOW (ref 8.9–10.3)
Chloride: 102 mmol/L (ref 101–111)
Creatinine, Ser: 0.63 mg/dL (ref 0.44–1.00)
GFR calc Af Amer: >60 mL/min (ref >60)
GFR calc non Af Amer: >60 mL/min (ref >60)
GLUCOSE: 98 mg/dL (ref 65–99)
Potassium: 3.4 mmol/L — ABNORMAL LOW (ref 3.5–5.1)
Sodium: 137 mmol/L (ref 135–145)

## 2016-12-29 NOTE — Progress Notes (Signed)
Location:   [facility] Room Number: 124/P Place of Service:  SNF [phone]) Provider:  Fernand Parkins, MD  Patient Care Team: Celene Squibb, MD as PCP - General (Internal Medicine)  Extended Emergency Contact Information Primary Emergency Contact: Winsome, [place], [place] [address] [address] Phone: [phone] Relation: Grandson Secondary Emergency Contact: Daniels,Rhiannon Address: [address]          [place], [place] [address] [place] Phone: [phone] Relation: Grandaughter  Code Status:  DNR Goals of care: Advanced Directive information Advanced Directives 12/29/2016  Does Patient Have a Medical Advance Directive? Yes  Type of Advance Directive Out of facility DNR (pink MOST or yellow form)  Does patient want to make changes to medical advance directive? No - Patient declined  Copy of [place] in Chart? -     Chief Complaint  Patient presents with  . Medical Management of Chronic Issues    Routine Visit  For medical management of chronic medical issues including recent close left femoral neck fracture status post repair-dementia-hypothyroidism?-diabetes type 2-hypertension.--Also recently diagnosed small pulmonary embolism    HPI:  Pt is a 83 y.o. female seen today for medical management of above-stated diagnoses.  Patient initially was admitted here for rehabilitation after sustaining closed left femoral neck fracture status post repair.  Shortly after admission she was noted to have some increased left leg edema-ultrasound of the leg showed a previous DVT but no acute process.  However her d-dimer was elevated cc scan of the chest did show an acute lobar segmental and subsegmental pulmonary embolus isolated to the right lower lobe.  Also showed an isolated left pulmonary nodule.  She was started on Lovenox-and has been transitioned to Xarelto.  She denies any chest  pain or shortness of breath although she is a poor historian and actually somewhat agitated with exam today.  It appears she has lost some weight and she is receiving aggressive supplemental nutrition-it appears she's lost approximately 10 pounds since her admission.  Regards to hypertension she has variable systolics but I do not see consistent elevations most recently 135/68-152/63-137/73---117/59-she is on clonidine patch 0.2 mg changed weekly-.  In regards to dementia she continues on Aricept she also has an order for Ativan 0.5 mg every 8 hours when necessary for anxiety.  Again her dementia appears to be fairly severe.  Labs today have come back looking fairly baseline except for a potassium slightly low at 3.4 this is being supplemented at 20 mEq a day we will increase the dose.  Hemoglobin is stable at 10 she is on iron-she also has thrombocytosis but this ap  pears to be trending down at 559,000 on lab done today  She has a history of type 2 diabetes this is diet-controlled appears to be stable recent blood sugars in the 105-9 7-109     Past Medical History:  Diagnosis Date  . Dementia   . Diabetes mellitus   . Diagnosis unknown   . High cholesterol   . Hypertension    Past Surgical History:  Procedure Laterality Date  . EYE SURGERY    . HIP PINNING,CANNULATED Right 03/20/2016   Procedure: INSERT SCREWS RIGHT HIP-CANNULATED PINNING RIGHT HIP;  Surgeon: Carole Civil, MD;  Location: [facility];  Service: Orthopedics;  Laterality: Right;  . HIP PINNING,CANNULATED Left 11/27/2016   Procedure: LEFT HIP PINNING;  Surgeon: Dorothyann Peng  Vela Prose, MD;  Location: [facility];  Service: Orthopedics;  Laterality: Left;    No Known Allergies  Allergies as of 12/29/2016   No Known Allergies     Medication List       Accurate as of 12/29/16  4:11 PM. Always use your most recent med list.          acetaminophen 500 MG chewable tablet Commonly known as:  TYLENOL Chew 500 mg by  mouth 2 (two) times daily.   calcium-vitamin D 500-200 MG-UNIT tablet Commonly known as:  OSCAL WITH D Take 1 tablet by mouth 2 (two) times daily.   cloNIDine 0.2 mg/24hr patch Commonly known as:  CATAPRES - Dosed in mg/24 hr Place 1 patch (0.2 mg total) onto the skin once a week.   donepezil 10 MG tablet Commonly known as:  ARICEPT Take 10 mg by mouth at bedtime.   feeding supplement (PRO-STAT 64) Liqd Take 30 mLs by mouth 2 (two) times daily between meals.   ferrous sulfate 325 (65 FE) MG tablet Take 325 mg by mouth daily with breakfast.   LORazepam 0.5 MG tablet Commonly known as:  ATIVAN Take 0.5 tablets (0.25 mg total) by mouth every 8 (eight) hours as needed for anxiety.   polyethylene glycol packet Commonly known as:  MIRALAX / GLYCOLAX Take 17 g by mouth daily.   Rivaroxaban 15 MG Tabs tablet Commonly known as:  XARELTO Take 15 mg by mouth 2 (two) times daily with a meal. Until 12/31/16 Give 20 mg by mouth once a day   traMADol 50 MG tablet Commonly known as:  ULTRAM Take 1 tablet (50 mg total) by mouth every 6 (six) hours.       Review of Systems   This is essentially unattainable secondary to patient's dementia and somewhat agitated with exam  Immunization History  Administered Date(s) Administered  . Tdap 07/20/2011   Pertinent  Health Maintenance Due  Topic Date Due  . FOOT EXAM  02/08/1933  . OPHTHALMOLOGY EXAM  02/08/1933  . URINE MICROALBUMIN  02/08/1933  . DEXA SCAN  02/08/1990  . INFLUENZA VACCINE  11/14/2017 (Originally 07/15/2016)  . PNA vac Low Risk Adult (1 of 2 - PCV13) 11/14/2017 (Originally 02/08/1990)  . HEMOGLOBIN A1C  06/10/2017   No flowsheet data found. Functional Status Survey:    Vitals:   12/29/16 1600  BP: (!) 117/59  Pulse: 89  Resp: 20  Temp: 98.6 F (37 C)  TempSrc: Oral   Weight is 85.8 pounds Physical Exam Constitutional: Frail elderly female in comfortably in bed. She appears cachectic.    HENT:  Head:  Normocephalic.  Mouth/Throat: Oropharynx is clear and moist.  Eyes: Pupils are equal, round, and reactive to light.  Cardiovascular: Normal rate, regular rhythm and normal heart sounds.   Pulmonary/Chest: Effort normal and breath sounds normal. No respiratory distress. She has no wheezes. She has no rales. --Somewhat shallow air entry with poor respiratory effort Abdominal: Soft. Bowel sounds are normal. She exhibits no distension. There is no tenderness.  Musculoskeletal:   slight edema in both legs a bit more on the left versus the right   L   Neurological: She is alert. Appears able to move all extremities although this is wAS limited secondary patient being agitated with exam Skin: Skin is warm and dry. No rash noted. No erythema.  Scar healing well. on left hip no sign of infection   Psychiatric:  Not evaluated due to dementia. She is agitated with  the exam     Labs reviewed:  Recent Labs  12/22/16 0700 12/25/16 0500 12/29/16 0730  NA 139 138 137  K 3.2* 4.5 3.4*  CL 103 107 102  CO2 29 22 28   GLUCOSE 108* 98 98  BUN 23* 26* 23*  CREATININE 0.60 0.71 0.63  CALCIUM 8.6* 8.5* 8.8*    Recent Labs  03/20/16 0030  AST 45*  ALT 22  ALKPHOS 76  BILITOT 1.0  PROT 6.5  ALBUMIN 3.7    Recent Labs  12/14/16 1100  12/16/16 0800 12/22/16 0700 12/25/16 0500 12/29/16 0730  WBC 14.0*  < > 10.3 7.8 10.4 8.1  NEUTROABS 10.6*  --  8.0* 5.4  --   --   HGB 10.3*  < > 10.0* 9.6* 9.9* 10.0*  HCT 32.8*  < > 31.9* 30.4* 31.3* 31.9*  MCV 82.6  < > 82.2 82.8 81.9 82.9  PLT 840*  < > 694* 619* 565* 559*  < > = values in this interval not displayed. Lab Results  Component Value Date   TSH 4.250 12/10/2016   Lab Results  Component Value Date   HGBA1C 5.9 (H) 12/10/2016   No results found for: CHOL, HDL, LDLCALC, LDLDIRECT, TRIG, CHOLHDL  Significant Diagnostic Results in last 30 days:  Dg Chest 1 View  Result Date: 12/14/2016 CLINICAL DATA:  Leukocytosis. EXAM: CHEST  1 VIEW COMPARISON:  November 26, 2016 FINDINGS: The heart size and mediastinal contours are stable. There is no focal infiltrate, pulmonary edema, or pleural effusion. The visualized skeletal structures are unremarkable. IMPRESSION: No active cardiopulmonary disease. Electronically Signed   By: Abelardo Diesel M.D.   On: 12/14/2016 16:49   Ct Angio Chest Pe W Or Wo Contrast  Result Date: 12/09/2016 CLINICAL DATA:  Elevated D-dimer. EXAM: CT ANGIOGRAPHY CHEST WITH CONTRAST TECHNIQUE: Multidetector CT imaging of the chest was performed using the standard protocol during bolus administration of intravenous contrast. Multiplanar CT image reconstructions and MIPs were obtained to evaluate the vascular anatomy. CONTRAST:  100 cc Isovue 370 IV. COMPARISON:  11/26/2016 chest radiograph. FINDINGS: Partially motion degraded scan. Cardiovascular: The study is high quality for the evaluation of pulmonary embolism. No filling defects in the main or central right or left pulmonary arteries. There are acute pulmonary emboli in the lobar, segmental and subsegmental branches of the right lower lobe. No additional filling defects in the pulmonary arterial tree. Thoracic aortic atherosclerosis. Ectasia of the ascending thoracic aorta, maximum diameter 4.2 cm. Aberrant nonaneurysmal right subclavian artery arising from the distal aortic arch with retroesophageal course. Normal caliber main pulmonary artery (1.7 cm diameter). Normal heart size. RV / LV ratio 0.89. No significant pericardial fluid/thickening. Left main, left anterior descending and right coronary atherosclerosis. Mediastinum/Nodes: Hypodense 1.2 cm left thyroid lobe nodule. Unremarkable esophagus. No pathologically enlarged axillary, mediastinal or hilar lymph nodes. Lungs/Pleura: No pneumothorax. No pleural effusion. Subpleural 3 mm solid peripheral left upper lobe pulmonary nodule (series 8/ image 39). Hypoventilatory changes in the dependent lower lobes. No acute  consolidative airspace disease, lung masses or additional significant pulmonary nodules. Upper abdomen: Simple 1.5 cm left liver lobe cyst. Musculoskeletal: No aggressive appearing focal osseous lesions. Mild to moderate thoracic spondylosis. Review of the MIP images confirms the above findings. IMPRESSION: 1. Acute lobar, segmental and subsegmental pulmonary embolism isolated to the right lower lobe. No central pulmonary emboli. Normal main pulmonary artery diameter. RV / LV ratio is not elevated. 2. Aortic atherosclerosis. Ectatic ascending thoracic aorta, maximum diameter 4.2 cm. 3.  Aberrant nonaneurysmal right subclavian artery arising from the distal aortic arch with retroesophageal course. 4. Solitary subpleural 3 mm left upper lobe pulmonary nodule, probably benign. No follow-up needed if patient is low-risk. Non-contrast chest CT can be considered in 12 months if patient is high-risk. This recommendation follows the consensus statement: Guidelines for Management of Incidental Pulmonary Nodules Detected on CT Images: From the Fleischner Society 2017; Radiology 2017; 284:228-243. These results were called by telephone at the time of interpretation on 12/09/2016 at 5:08 pm to Dr. Inocencio Homes, who verbally acknowledged these results. Electronically Signed   By: Ilona Sorrel M.D.   On: 12/09/2016 17:26   US Venous Img Lower Unilateral Left  Result Date: 12/09/2016 CLINICAL DATA:  Left lower extremity swelling for 2 weeks. Recent left hip fracture status postoperative fixation 11/28/2016 EXAM: LEFT LOWER EXTREMITY VENOUS DOPPLER ULTRASOUND TECHNIQUE: Gray-scale sonography with graded compression, as well as color Doppler and duplex ultrasound were performed to evaluate the lower extremity deep venous systems from the level of the common femoral vein and including the common femoral, femoral, profunda femoral, popliteal and calf veins including the posterior tibial, peroneal and gastrocnemius veins when  visible. The superficial great saphenous vein was also interrogated. Spectral Doppler was utilized to evaluate flow at rest and with distal augmentation maneuvers in the common femoral, femoral and popliteal veins. COMPARISON:  None. FINDINGS: Contralateral Common Femoral Vein: Respiratory phasicity is normal and symmetric with the symptomatic side. No evidence of thrombus. Normal compressibility. Common Femoral Vein: Slight wall thickening and small diameter without acute DVT. Suspect sequelae from prior DVT. Saphenofemoral Junction: No evidence of thrombus. Normal compressibility and flow on color Doppler imaging. Profunda Femoral Vein: Similar wall thickening and small diameter without acute thrombus. Suspect sequelae from prior DVT. Femoral Vein: Proximal femoral vein also demonstrates wall thickening and small diameter without thrombus. Compatible with sequelae from prior DVT. Mid and distal femoral vein appears patent and normal. Popliteal Vein: No evidence of thrombus. Normal compressibility, respiratory phasicity and response to augmentation. Calf Veins: No evidence of thrombus. Normal compressibility and flow on color Doppler imaging. Superficial Great Saphenous Vein: No evidence of thrombus. Normal compressibility and flow on color Doppler imaging. Venous Reflux:  None. Other Findings:  None. IMPRESSION: Negative for acute left lower extremity DVT. Wall thickening and small diameter of the left common femoral, proximal profunda femoral and proximal femoral veins. Suspect sequelae from prior DVT. Electronically Signed   By: Jerilynn Mages.  Shick M.D.   On: 12/09/2016 10:59   Dg Hip Unilat W Or Wo Pelvis 2-3 Views Left  Result Date: 12/08/2016 CLINICAL DATA:  Dementia, lower extremity deformities, recent left hip fixation for a left femoral surgical neck fracture. EXAM: DG HIP (WITH OR WITHOUT PELVIS) 2-3V LEFT COMPARISON:  11/27/2016 FINDINGS: Bones are osteopenic. Degenerative changes noted of the lumbar sacral  spine with a mild scoliosis. Bony pelvis appears intact. 3 left hip fixation screws traverse the left hip subcapital femoral neck fracture. Lateral skin staples noted. Grossly stable appearance. Remote operative fixation of the right hip as well. IMPRESSION: Stable appearance of the left hip subcapital femoral neck fracture status postoperative fixation. Limited exam because of positioning. Other chronic and postoperative findings as above. Electronically Signed   By: Jerilynn Mages.  Shick M.D.   On: 12/08/2016 12:18    Assessment/Plan . History of pulmonary embolism-he did have a segmental PE on CT scan after hip surgery she is asymptomatic-but still is high risk of having another episode as she is fairly  immobile secondary to her dementia.  This is complicated with high risk of bleeding due to her histories of fall frailty and age-however thought at this time benefits of anticoagulation outweighs the risk.  She is on Xarelto 15 mg twice a day this will be transitioned to once a day at 20 mg on January 18.  #2 hypertension this appears to be stable she is on clonidine 0.2 mg.  #3 history of fracture neck of the left femur-she is on calcium she is followed by orthopedics--and  thought to be stable per review of orthopedic note on 12/17/2016.  She does have tramadol as needed for pain as well as Tylenol twice a day.  #4 anemia-hemoglobin has been stable since surgery she is IRON-also has had thrombocytosis but this appears to be trending down slowly.  At this point will monitor periodically.  #5 history of dementia-she is on Aricept is thought her prognosis is fairly poor here she has lost some weight this has been addressed she recently had additional supplementation started will update a CMP later this week to check on her albumin continue supportive care She does have an order for Ativan when necessary for anxiety.  #6 history of hypokalemia this appears fairly mild but somewhat chronic-will increase  her potassium up to 30 mEq day and update a metabolic panel later this week.  #7-history diabetes type 2 this appears diet-controlled blood sugars appear to be satisfactory largely around 100 hemoglobin A1c most recently was 5.9.  #8 history hypothyroidism?-- TSH was 4.25 on lab done 12/10/2016    [id number]-of note greater than 40 minutes spent assessing patient-reviewing her chart-reviewing her labs-and coordinating and formulating a plan of care for numerous diagnoses-of note greater than 50% of time spent coordinating plan of care     Oralia Manis, [place]

## 2016-12-30 DIAGNOSIS — R279 Unspecified lack of coordination: Secondary | ICD-10-CM | POA: Diagnosis not present

## 2016-12-30 DIAGNOSIS — S72012D Unspecified intracapsular fracture of left femur, subsequent encounter for closed fracture with routine healing: Secondary | ICD-10-CM | POA: Diagnosis not present

## 2016-12-30 DIAGNOSIS — Z4789 Encounter for other orthopedic aftercare: Secondary | ICD-10-CM | POA: Diagnosis not present

## 2016-12-30 DIAGNOSIS — M6281 Muscle weakness (generalized): Secondary | ICD-10-CM | POA: Diagnosis not present

## 2016-12-30 DIAGNOSIS — R293 Abnormal posture: Secondary | ICD-10-CM | POA: Diagnosis not present

## 2016-12-30 DIAGNOSIS — F0391 Unspecified dementia with behavioral disturbance: Secondary | ICD-10-CM | POA: Diagnosis not present

## 2016-12-31 ENCOUNTER — Other Ambulatory Visit (HOSPITAL_COMMUNITY)
Admission: AD | Admit: 2016-12-31 | Discharge: 2016-12-31 | Disposition: A | Discharge status: Home / Self Care | Payer: PPO | Source: Skilled Nursing Facility | Attending: Internal Medicine | Admitting: Internal Medicine

## 2016-12-31 DIAGNOSIS — I2699 Other pulmonary embolism without acute cor pulmonale: Secondary | ICD-10-CM | POA: Diagnosis not present

## 2016-12-31 DIAGNOSIS — I10 Essential (primary) hypertension: Secondary | ICD-10-CM | POA: Diagnosis not present

## 2016-12-31 DIAGNOSIS — S72012D Unspecified intracapsular fracture of left femur, subsequent encounter for closed fracture with routine healing: Secondary | ICD-10-CM | POA: Insufficient documentation

## 2016-12-31 DIAGNOSIS — Z4789 Encounter for other orthopedic aftercare: Secondary | ICD-10-CM | POA: Diagnosis not present

## 2016-12-31 DIAGNOSIS — X58XXXD Exposure to other specified factors, subsequent encounter: Secondary | ICD-10-CM | POA: Diagnosis not present

## 2016-12-31 LAB — C DIFFICILE QUICK SCREEN W PCR REFLEX
C DIFFICLE (CDIFF) ANTIGEN: POSITIVE — AB
C Diff toxin: NEGATIVE

## 2016-12-31 LAB — CLOSTRIDIUM DIFFICILE BY PCR: Toxigenic C. Difficile by PCR: POSITIVE — AB

## 2017-01-01 ENCOUNTER — Encounter (HOSPITAL_COMMUNITY)
Admission: RE | Admit: 2017-01-01 | Discharge: 2017-01-01 | Disposition: A | Discharge status: Home / Self Care | Payer: PPO | Source: Skilled Nursing Facility | Attending: Internal Medicine | Admitting: Internal Medicine

## 2017-01-01 ENCOUNTER — Encounter: Payer: Self-pay | Admitting: Internal Medicine

## 2017-01-01 ENCOUNTER — Non-Acute Institutional Stay (SKILLED_NURSING_FACILITY): Payer: PPO | Admitting: Internal Medicine

## 2017-01-01 DIAGNOSIS — F0280 Dementia in other diseases classified elsewhere without behavioral disturbance: Secondary | ICD-10-CM

## 2017-01-01 DIAGNOSIS — A0472 Enterocolitis due to Clostridium difficile, not specified as recurrent: Secondary | ICD-10-CM | POA: Diagnosis not present

## 2017-01-01 DIAGNOSIS — I10 Essential (primary) hypertension: Secondary | ICD-10-CM

## 2017-01-01 DIAGNOSIS — I2699 Other pulmonary embolism without acute cor pulmonale: Secondary | ICD-10-CM | POA: Insufficient documentation

## 2017-01-01 DIAGNOSIS — G309 Alzheimer's disease, unspecified: Secondary | ICD-10-CM | POA: Diagnosis not present

## 2017-01-01 DIAGNOSIS — X58XXXD Exposure to other specified factors, subsequent encounter: Secondary | ICD-10-CM | POA: Diagnosis not present

## 2017-01-01 DIAGNOSIS — Z4789 Encounter for other orthopedic aftercare: Secondary | ICD-10-CM | POA: Insufficient documentation

## 2017-01-01 DIAGNOSIS — S72012D Unspecified intracapsular fracture of left femur, subsequent encounter for closed fracture with routine healing: Secondary | ICD-10-CM | POA: Diagnosis not present

## 2017-01-01 LAB — COMPREHENSIVE METABOLIC PANEL
ALT: 9 U/L — ABNORMAL LOW (ref 14–54)
ANION GAP: 10 (ref 5–15)
AST: 28 U/L (ref 15–41)
Albumin: 3.1 g/dL — ABNORMAL LOW (ref 3.5–5.0)
Alkaline Phosphatase: 68 U/L (ref 38–126)
BUN: 28 mg/dL — ABNORMAL HIGH (ref 6–20)
CHLORIDE: 104 mmol/L (ref 101–111)
CO2: 27 mmol/L (ref 22–32)
CREATININE: 0.76 mg/dL (ref 0.44–1.00)
Calcium: 9 mg/dL (ref 8.9–10.3)
Glucose, Bld: 111 mg/dL — ABNORMAL HIGH (ref 65–99)
POTASSIUM: 3.7 mmol/L (ref 3.5–5.1)
SODIUM: 141 mmol/L (ref 135–145)
Total Bilirubin: 0.6 mg/dL (ref 0.3–1.2)
Total Protein: 6.4 g/dL — ABNORMAL LOW (ref 6.5–8.1)

## 2017-01-01 NOTE — Progress Notes (Addendum)
Location:   [facility] Room Number: 124/P Place of Service:  SNF (31) Provider:  Marylen Ponto, MD  Patient Care Team: Celene Squibb, MD as PCP - General (Internal Medicine)  Extended Emergency Contact Information Primary Emergency Contact: Shunika, [place], [place] [address] [place] Phone: [phone] Relation: Grandson Secondary Emergency Contact: Daniels,Rhiannon Address: [address]          [place], [place] [address] [place] Phone: [phone] Relation: Grandaughter  Code Status:  DNR Goals of care: Advanced Directive information Advanced Directives 01/01/2017  Does Patient Have a Medical Advance Directive? Yes  Type of Advance Directive Out of facility DNR (pink MOST or yellow form)  Does patient want to make changes to medical advance directive? No - Patient declined  Copy of [place] in Chart? -     Chief Complaint  Patient presents with  . Acute Visit    + for C-Diff/not taking meds    HPI:  Pt is a 83 y.o. female seen today for an acute visit for C.Diff positive stool.  Patient has Recent h/o Closed Left Femoral Neck Fracture S/P ORIF on 12/14 h/o dementia, Hypothyroidism, Uterovaginal Prolapse, Previous Right hip fracture in 04/17, Diabetes Mellitus not on any hypoglycemics. And Hypertension. And recently Diagnosed with PE On Xarelto.  Unfortunately Patient husband is in Next room with severe dementia and Had C Diff Colitis. Their Family takes them both together into the same room. Patient has not been on any antibiotics recently. Staff noticed Diarrhea and stool was ordered for C.Diff. It was positive. Patient unable to give me any history. She continues to have diarrhea. Staff also says she is not eating much . No nausea or vomiting noticed.   Past Medical History:  Diagnosis Date  . Dementia   . Diabetes mellitus   . Diagnosis unknown   . High  cholesterol   . Hypertension    Past Surgical History:  Procedure Laterality Date  . EYE SURGERY    . HIP PINNING,CANNULATED Right 03/20/2016   Procedure: INSERT SCREWS RIGHT HIP-CANNULATED PINNING RIGHT HIP;  Surgeon: Carole Civil, MD;  Location: [facility];  Service: Orthopedics;  Laterality: Right;  . HIP PINNING,CANNULATED Left 11/27/2016   Procedure: LEFT HIP PINNING;  Surgeon: Carole Civil, MD;  Location: [facility];  Service: Orthopedics;  Laterality: Left;    No Known Allergies  Allergies as of 01/01/2017   No Known Allergies     Medication List       Accurate as of 01/01/17 12:51 PM. Always use your most recent med list.          acetaminophen 500 MG chewable tablet Commonly known as:  TYLENOL Chew 500 mg by mouth 2 (two) times daily.   calcium-vitamin D 500-200 MG-UNIT tablet Commonly known as:  OSCAL WITH D Take 1 tablet by mouth 2 (two) times daily.   cloNIDine 0.2 mg/24hr patch Commonly known as:  CATAPRES - Dosed in mg/24 hr Place 1 patch (0.2 mg total) onto the skin once a week.   donepezil 10 MG tablet Commonly known as:  ARICEPT Take 10 mg by mouth at bedtime.   feeding supplement (PRO-STAT 64) Liqd Take 30 mLs by mouth 2 (two) times daily between meals.   ferrous sulfate 325 (65 FE) MG tablet Take 325 mg by mouth daily with breakfast.   GLUCERNA Liqd Take 237  mLs by mouth 2 (two) times daily between meals.   LORazepam 0.5 MG tablet Commonly known as:  ATIVAN Take 0.5 tablets (0.25 mg total) by mouth every 8 (eight) hours as needed for anxiety.   metroNIDAZOLE 500 MG tablet Commonly known as:  FLAGYL Take 500 mg by mouth 3 (three) times daily. Give for 10 days stop date 01/10/17   polyethylene glycol packet Commonly known as:  MIRALAX / GLYCOLAX Take 17 g by mouth daily.   potassium chloride 10 MEQ tablet Commonly known as:  K-DUR,KLOR-CON Take 10 mEq by mouth every other day.   RISA-BID PROBIOTIC Tabs Give 1 tablet by mouth twice a  day   rivaroxaban 20 MG Tabs tablet Commonly known as:  XARELTO Take 20 mg by mouth daily with supper.   traMADol 50 MG tablet Commonly known as:  ULTRAM Take 1 tablet (50 mg total) by mouth every 6 (six) hours.       Review of Systems  Unable to perform ROS: Dementia    Immunization History  Administered Date(s) Administered  . Tdap 07/20/2011   Pertinent  Health Maintenance Due  Topic Date Due  . FOOT EXAM  02/08/1933  . OPHTHALMOLOGY EXAM  02/08/1933  . URINE MICROALBUMIN  02/08/1933  . DEXA SCAN  02/08/1990  . INFLUENZA VACCINE  11/14/2017 (Originally 07/15/2016)  . PNA vac Low Risk Adult (1 of 2 - PCV13) 11/14/2017 (Originally 02/08/1990)  . HEMOGLOBIN A1C  06/10/2017   No flowsheet data found. Functional Status Survey:    Vitals:   01/01/17 1221  BP: (!) 111/53  Pulse: (!) 59  Resp: 20  Temp: 97.7 F (36.5 C)  TempSrc: Oral   There is no height or weight on file to calculate BMI. Physical Exam  Constitutional: She appears cachectic.  HENT:  Head: Normocephalic.  Mouth/Throat: Oropharynx is clear and moist.  Eyes: Pupils are equal, round, and reactive to light.  Neck: Neck supple.  Cardiovascular: Normal rate, regular rhythm and normal heart sounds.   Pulmonary/Chest: Effort normal and breath sounds normal.  Abdominal: Soft. Bowel sounds are normal. She exhibits no distension. There is no tenderness. There is no rebound.  Musculoskeletal: She exhibits no edema.  Neurological: She is alert.  Not oriented also does not follow much commands.  Skin: Skin is warm and dry.    Labs reviewed:  Recent Labs  12/25/16 0500 12/29/16 0730 01/01/17 0700  NA 138 137 141  K 4.5 3.4* 3.7  CL 107 102 104  CO2 22 28 27   GLUCOSE 98 98 111*  BUN 26* 23* 28*  CREATININE 0.71 0.63 0.76  CALCIUM 8.5* 8.8* 9.0    Recent Labs  03/20/16 0030 01/01/17 0700  AST 45* 28  ALT 22 9*  ALKPHOS 76 68  BILITOT 1.0 0.6  PROT 6.5 6.4*  ALBUMIN 3.7 3.1*    Recent  Labs  12/14/16 1100  12/16/16 0800 12/22/16 0700 12/25/16 0500 12/29/16 0730  WBC 14.0*  < > 10.3 7.8 10.4 8.1  NEUTROABS 10.6*  --  8.0* 5.4  --   --   HGB 10.3*  < > 10.0* 9.6* 9.9* 10.0*  HCT 32.8*  < > 31.9* 30.4* 31.3* 31.9*  MCV 82.6  < > 82.2 82.8 81.9 82.9  PLT 840*  < > 694* 619* 565* 559*  < > = values in this interval not displayed. Lab Results  Component Value Date   TSH 4.250 12/10/2016   Lab Results  Component Value Date  HGBA1C 5.9 (H) 12/10/2016   No results found for: CHOL, HDL, LDLCALC, LDLDIRECT, TRIG, CHOLHDL  Significant Diagnostic Results in last 30 days:  Dg Chest 1 View  Result Date: 12/14/2016 CLINICAL DATA:  Leukocytosis. EXAM: CHEST 1 VIEW COMPARISON:  November 26, 2016 FINDINGS: The heart size and mediastinal contours are stable. There is no focal infiltrate, pulmonary edema, or pleural effusion. The visualized skeletal structures are unremarkable. IMPRESSION: No active cardiopulmonary disease. Electronically Signed   By: Abelardo Diesel M.D.   On: 12/14/2016 16:49   Ct Angio Chest Pe W Or Wo Contrast  Result Date: 12/09/2016 CLINICAL DATA:  Elevated D-dimer. EXAM: CT ANGIOGRAPHY CHEST WITH CONTRAST TECHNIQUE: Multidetector CT imaging of the chest was performed using the standard protocol during bolus administration of intravenous contrast. Multiplanar CT image reconstructions and MIPs were obtained to evaluate the vascular anatomy. CONTRAST:  100 cc Isovue 370 IV. COMPARISON:  11/26/2016 chest radiograph. FINDINGS: Partially motion degraded scan. Cardiovascular: The study is high quality for the evaluation of pulmonary embolism. No filling defects in the main or central right or left pulmonary arteries. There are acute pulmonary emboli in the lobar, segmental and subsegmental branches of the right lower lobe. No additional filling defects in the pulmonary arterial tree. Thoracic aortic atherosclerosis. Ectasia of the ascending thoracic aorta, maximum  diameter 4.2 cm. Aberrant nonaneurysmal right subclavian artery arising from the distal aortic arch with retroesophageal course. Normal caliber main pulmonary artery (1.7 cm diameter). Normal heart size. RV / LV ratio 0.89. No significant pericardial fluid/thickening. Left main, left anterior descending and right coronary atherosclerosis. Mediastinum/Nodes: Hypodense 1.2 cm left thyroid lobe nodule. Unremarkable esophagus. No pathologically enlarged axillary, mediastinal or hilar lymph nodes. Lungs/Pleura: No pneumothorax. No pleural effusion. Subpleural 3 mm solid peripheral left upper lobe pulmonary nodule (series 8/ image 39). Hypoventilatory changes in the dependent lower lobes. No acute consolidative airspace disease, lung masses or additional significant pulmonary nodules. Upper abdomen: Simple 1.5 cm left liver lobe cyst. Musculoskeletal: No aggressive appearing focal osseous lesions. Mild to moderate thoracic spondylosis. Review of the MIP images confirms the above findings. IMPRESSION: 1. Acute lobar, segmental and subsegmental pulmonary embolism isolated to the right lower lobe. No central pulmonary emboli. Normal main pulmonary artery diameter. RV / LV ratio is not elevated. 2. Aortic atherosclerosis. Ectatic ascending thoracic aorta, maximum diameter 4.2 cm. 3. Aberrant nonaneurysmal right subclavian artery arising from the distal aortic arch with retroesophageal course. 4. Solitary subpleural 3 mm left upper lobe pulmonary nodule, probably benign. No follow-up needed if patient is low-risk. Non-contrast chest CT can be considered in 12 months if patient is high-risk. This recommendation follows the consensus statement: Guidelines for Management of Incidental Pulmonary Nodules Detected on CT Images: From the Fleischner Society 2017; Radiology 2017; 284:228-243. These results were called by telephone at the time of interpretation on 12/09/2016 at 5:08 pm to Dr. Inocencio Homes, who verbally acknowledged  these results. Electronically Signed   By: Ilona Sorrel M.D.   On: 12/09/2016 17:26   US Venous Img Lower Unilateral Left  Result Date: 12/09/2016 CLINICAL DATA:  Left lower extremity swelling for 2 weeks. Recent left hip fracture status postoperative fixation 11/28/2016 EXAM: LEFT LOWER EXTREMITY VENOUS DOPPLER ULTRASOUND TECHNIQUE: Gray-scale sonography with graded compression, as well as color Doppler and duplex ultrasound were performed to evaluate the lower extremity deep venous systems from the level of the common femoral vein and including the common femoral, femoral, profunda femoral, popliteal and calf veins including the posterior tibial, peroneal  and gastrocnemius veins when visible. The superficial great saphenous vein was also interrogated. Spectral Doppler was utilized to evaluate flow at rest and with distal augmentation maneuvers in the common femoral, femoral and popliteal veins. COMPARISON:  None. FINDINGS: Contralateral Common Femoral Vein: Respiratory phasicity is normal and symmetric with the symptomatic side. No evidence of thrombus. Normal compressibility. Common Femoral Vein: Slight wall thickening and small diameter without acute DVT. Suspect sequelae from prior DVT. Saphenofemoral Junction: No evidence of thrombus. Normal compressibility and flow on color Doppler imaging. Profunda Femoral Vein: Similar wall thickening and small diameter without acute thrombus. Suspect sequelae from prior DVT. Femoral Vein: Proximal femoral vein also demonstrates wall thickening and small diameter without thrombus. Compatible with sequelae from prior DVT. Mid and distal femoral vein appears patent and normal. Popliteal Vein: No evidence of thrombus. Normal compressibility, respiratory phasicity and response to augmentation. Calf Veins: No evidence of thrombus. Normal compressibility and flow on color Doppler imaging. Superficial Great Saphenous Vein: No evidence of thrombus. Normal compressibility and  flow on color Doppler imaging. Venous Reflux:  None. Other Findings:  None. IMPRESSION: Negative for acute left lower extremity DVT. Wall thickening and small diameter of the left common femoral, proximal profunda femoral and proximal femoral veins. Suspect sequelae from prior DVT. Electronically Signed   By: Jerilynn Mages.  Shick M.D.   On: 12/09/2016 10:59   Dg Hip Unilat W Or Wo Pelvis 2-3 Views Left  Result Date: 12/08/2016 CLINICAL DATA:  Dementia, lower extremity deformities, recent left hip fixation for a left femoral surgical neck fracture. EXAM: DG HIP (WITH OR WITHOUT PELVIS) 2-3V LEFT COMPARISON:  11/27/2016 FINDINGS: Bones are osteopenic. Degenerative changes noted of the lumbar sacral spine with a mild scoliosis. Bony pelvis appears intact. 3 left hip fixation screws traverse the left hip subcapital femoral neck fracture. Lateral skin staples noted. Grossly stable appearance. Remote operative fixation of the right hip as well. IMPRESSION: Stable appearance of the left hip subcapital femoral neck fracture status postoperative fixation. Limited exam because of positioning. Other chronic and postoperative findings as above. Electronically Signed   By: Jerilynn Mages.  Shick M.D.   On: 12/08/2016 12:18    Assessment/Plan  C Diff colitis She was started on Flagyl. Will continue for 10 days. D/W staff they need to make sure the husband and wife stay in isolations and do not go into each other rooms till colitis is resolved. Her HGB is stable. And does not have any leucocytosis.  Pulmonary Embolism Continue on Xarelto  Hypertension BP better controlled   Alzheimer's dementia  Per nurses she is not eating well and also refuses her Meds. Her weight is down to 85 lbs from 90 lbs. Will Change her to liquid meds when possible Start her on Remeron at night to see if it helps her appetite. Her prognosis is poor due to her dementia.   Family/ staff Communication:   Labs/tests ordered:

## 2017-01-07 ENCOUNTER — Non-Acute Institutional Stay (SKILLED_NURSING_FACILITY): Payer: PPO | Admitting: Internal Medicine

## 2017-01-07 ENCOUNTER — Encounter: Payer: Self-pay | Admitting: Internal Medicine

## 2017-01-07 DIAGNOSIS — I2699 Other pulmonary embolism without acute cor pulmonale: Secondary | ICD-10-CM

## 2017-01-07 DIAGNOSIS — A0472 Enterocolitis due to Clostridium difficile, not specified as recurrent: Secondary | ICD-10-CM | POA: Diagnosis not present

## 2017-01-07 DIAGNOSIS — R627 Adult failure to thrive: Secondary | ICD-10-CM | POA: Diagnosis not present

## 2017-01-07 DIAGNOSIS — E119 Type 2 diabetes mellitus without complications: Secondary | ICD-10-CM | POA: Diagnosis not present

## 2017-01-07 DIAGNOSIS — S72002S Fracture of unspecified part of neck of left femur, sequela: Secondary | ICD-10-CM

## 2017-01-07 NOTE — Progress Notes (Signed)
Location:   [facility] Room Number: 124/P Place of Service:  SNF (31)  Provider: Granville Vallo  PCP: Wende Neighbors, MD Patient Care Team: Celene Squibb, MD as PCP - General (Internal Medicine)  Extended Emergency Contact Information Primary Emergency Contact: Shirlean Kelly, [place] [address] [address] Phone: [phone] Relation: Grandson Secondary Emergency Contact: Daniels,Rhiannon Address: [address]          [place], [place] [address] [place] Phone: [phone] Relation: Grandaughter  Code Status: DNR Goals of care:  Advanced Directive information Advanced Directives 01/07/2017  Does Patient Have a Medical Advance Directive? Yes  Type of Advance Directive Out of facility DNR (pink MOST or yellow form)  Does patient want to make changes to medical advance directive? No - Patient declined  Copy of [place] in Chart? -     No Known Allergies  Chief complaint-acute visit to assess for possible discharge    HPI:  83 y.o. Paula [name]  seen today for  possible discharge from facility-she was initially admitted here for rehabilitation after sustaining a left hip fracture-check she was with her husband who also sustained a fracture of the left hip.  She along with her husband will be going to assisted living both will be under hospice services.  She has had significant failure to thrive issues during her stay here-this was complicated somewhat with a diagnosis of a small pulmonary embolism as well--she continues on Xarelto for the PE-.  In regards to faiure  to thrive she's been started on Remeron but she has continued to lose weight has lost about 10 pounds since her admission.  This is further complicated with some history of C. difficile-her husband did have C. difficile she has been in his room previously in appear she obtained it as well she is on a course of Flagyl through January  27.  In regards to her left hip fracture she is on calcium and has been followed by orthopedics this appears relatively stable although she is relatively immobile with her failure to thrive and significant weakness.  She does have a history diabetes type 2 this appears to be diet-controlled CBGs appear to be largely around 100 most recent hemoglobin A1c was 5.9.  Currently she is resting in bed comfortably she is somewhat agitated with exam which limited it-vital signs have been stable.        Past Medical History:  Diagnosis Date  . Dementia   . Diabetes mellitus   . Diagnosis unknown   . High cholesterol   . Hypertension     Past Surgical History:  Procedure Laterality Date  . EYE SURGERY    . HIP PINNING,CANNULATED Right 03/20/2016   Procedure: INSERT SCREWS RIGHT HIP-CANNULATED PINNING RIGHT HIP;  Surgeon: Carole Civil, MD;  Location: [facility];  Service: Orthopedics;  Laterality: Right;  . HIP PINNING,CANNULATED Left 11/27/2016   Procedure: LEFT HIP PINNING;  Surgeon: Carole Civil, MD;  Location: [facility];  Service: Orthopedics;  Laterality: Left;      reports that she has never smoked. She has never used smokeless tobacco. She reports that she does not drink alcohol or use drugs. Social History   Social History  . Marital status: Married    Spouse name: N/A  . Number of children: N/A  . Years of education: N/A   Occupational History  . Not on file.  Social History Main Topics  . Smoking status: Never Smoker  . Smokeless tobacco: Never Used  . Alcohol use No  . Drug use: No  . Sexual activity: No   Other Topics Concern  . Not on file   Social History Narrative  . No narrative on file   Functional Status Survey:    No Known Allergies  Pertinent  Health Maintenance Due  Topic Date Due  . FOOT EXAM  02/08/1933  . OPHTHALMOLOGY EXAM  02/08/1933  . URINE MICROALBUMIN  02/08/1933  . DEXA SCAN  02/08/1990  . INFLUENZA VACCINE  11/14/2017  (Originally 07/15/2016)  . PNA vac Low Risk Adult (1 of 2 - PCV13) 11/14/2017 (Originally 02/08/1990)  . HEMOGLOBIN A1C  06/10/2017    Medications: Allergies as of 01/07/2017   No Known Allergies     Medication List       Accurate as of 01/07/17 12:02 PM. Always use your most recent med list.          acetaminophen 500 MG chewable tablet Commonly known as:  TYLENOL Chew 500 mg by mouth 2 (two) times daily.   calcium-vitamin D 500-200 MG-UNIT tablet Commonly known as:  OSCAL WITH D Take 1 tablet by mouth 2 (two) times daily.   cloNIDine 0.2 mg/24hr patch Commonly known as:  CATAPRES - Dosed in mg/24 hr Place 1 patch (0.2 mg total) onto the skin once a week.   donepezil 10 MG tablet Commonly known as:  ARICEPT Take 10 mg by mouth at bedtime.   feeding supplement (PRO-STAT 64) Liqd Take 30 mLs by mouth 2 (two) times daily between meals.   ferrous sulfate 325 (65 FE) MG tablet Take 325 mg by mouth daily with breakfast.   GLUCERNA Liqd Take 237 mLs by mouth 2 (two) times daily between meals.   LORazepam 0.5 MG tablet Commonly known as:  ATIVAN Take 0.5 tablets (0.25 mg total) by mouth every 8 (eight) hours as needed for anxiety.   metroNIDAZOLE 500 MG tablet Commonly known as:  FLAGYL Take 500 mg by mouth 3 (three) times daily. Give for 10 days stop date 01/10/17   mirtazapine 15 MG tablet Commonly known as:  REMERON Give 1/2 of 15 mg tablet by mouth at bedtime   polyethylene glycol packet Commonly known as:  MIRALAX / GLYCOLAX Take 17 g by mouth daily.   potassium chloride 10 MEQ tablet Commonly known as:  K-DUR,KLOR-CON Take 30 mEq by mouth every other day.   RISA-BID PROBIOTIC Tabs Give 1 tablet by mouth twice a day   rivaroxaban 20 MG Tabs tablet Commonly known as:  XARELTO Take 20 mg by mouth daily with supper.   traMADol 50 MG tablet Commonly known as:  ULTRAM Take 1 tablet (50 mg total) by mouth every 6 (six) hours.       Review of Systems    This essentially is not possible to obtain secondary to dementia  Vitals:   01/07/17 1201  BP: 128/80  Pulse: 69  Resp: (!) 21  Temp: 97.7 F (36.5 C)  TempSrc: Oral  SpO2: 94%   Her weight is 80 pounds Physical Exam Constitutional: Frail elderly Paula [name] in comfortably in bed. She appears cachectic.  She is somewhat agitated with any attempt at  examination   HENT:  Head: Normocephalic.  Mucous membranes appear somewhat dry Mouth/Throat: Did not open her mouth very wide but oropharynx appeared to be clear.  Eyes: Pupils are equal, round, and reactive to light.  Cardiovascular:  Normal rate, regular rhythmand normal heart sounds.  Pulmonary/Chest: Effort normaland breath sounds normal. No respiratory distress. She has no wheezes. She has no rales. --Somewhat shallow air entry with poor respiratory effort Abdominal: Soft. Bowel sounds are normal. She exhibits no distension. Does not appear to have any acute tenderness although difficult to fully tell since she is quite agitated with exam  Musculoskeletal:    holds lower extremities in somewhat of a contracted position she has protective boots on does move her upper extremities strength appears to be intact her upper extremities L   Neurological: She is alert. Appears able to move all extremities although this is wAS limited secondary patient being agitated with exam Skin: Skin is warmand dry. No rashnoted. No erythema.  Psychiatric:  Not evaluated due to dementia. She is agitated with the exam     Labs reviewed: Basic Metabolic Panel:  Recent Labs  12/25/16 0500 12/29/16 0730 01/01/17 0700  NA 138 137 141  K 4.5 3.4* 3.7  CL 107 102 104  CO2 22 28 27   GLUCOSE 98 98 111*  BUN Paula* 23* 28*  CREATININE 0.71 0.63 0.76  CALCIUM 8.5* 8.8* 9.0   Liver Function Tests:  Recent Labs  03/20/16 0030 01/01/17 0700  AST 45* 28  ALT 22 9*  ALKPHOS 76 68  BILITOT 1.0 0.6  PROT 6.5 6.4*  ALBUMIN 3.7 3.1*   No  results for input(s): LIPASE, AMYLASE in the last 8760 hours. No results for input(s): AMMONIA in the last 8760 hours. CBC:  Recent Labs  12/14/16 1100  12/16/16 0800 12/22/16 0700 12/25/16 0500 12/29/16 0730  WBC 14.0*  < > 10.3 7.8 10.4 8.1  NEUTROABS 10.6*  --  8.0* 5.4  --   --   HGB 10.3*  < > 10.0* 9.6* 9.9* 10.0*  HCT 32.8*  < > 31.9* 30.4* 31.3* 31.9*  MCV 82.6  < > 82.2 82.8 81.9 82.9  PLT 840*  < > 694* 619* 565* 559*  < > = values in this interval not displayed. Cardiac Enzymes: No results for input(s): CKTOTAL, CKMB, CKMBINDEX, TROPONINI in the last 8760 hours. BNP: Invalid input(s): POCBNP CBG:  Recent Labs  03/24/16 0500 11/27/16 1416 11/27/16 1738  GLUCAP 94 83 113*    Procedures and Imaging Studies During Stay: Dg Chest 1 View  Result Date: 12/14/2016 CLINICAL DATA:  Leukocytosis. EXAM: CHEST 1 VIEW COMPARISON:  November 26, 2016 FINDINGS: The heart size and mediastinal contours are stable. There is no focal infiltrate, pulmonary edema, or pleural effusion. The visualized skeletal structures are unremarkable. IMPRESSION: No active cardiopulmonary disease. Electronically Signed   By: Abelardo Diesel M.D.   On: 12/14/2016 16:49   Ct Angio Chest Pe W Or Wo Contrast  Result Date: 12/Paula/2017 CLINICAL DATA:  Elevated D-dimer. EXAM: CT ANGIOGRAPHY CHEST WITH CONTRAST TECHNIQUE: Multidetector CT imaging of the chest was performed using the standard protocol during bolus administration of intravenous contrast. Multiplanar CT image reconstructions and MIPs were obtained to evaluate the vascular anatomy. CONTRAST:  100 cc Isovue 370 IV. COMPARISON:  11/26/2016 chest radiograph. FINDINGS: Partially motion degraded scan. Cardiovascular: The study is high quality for the evaluation of pulmonary embolism. No filling defects in the main or central right or left pulmonary arteries. There are acute pulmonary emboli in the lobar, segmental and subsegmental branches of the right  lower lobe. No additional filling defects in the pulmonary arterial tree. Thoracic aortic atherosclerosis. Ectasia of the ascending thoracic aorta, maximum diameter 4.2 cm.  Aberrant nonaneurysmal right subclavian artery arising from the distal aortic arch with retroesophageal course. Normal caliber main pulmonary artery (1.7 cm diameter). Normal heart size. RV / LV ratio 0.89. No significant pericardial fluid/thickening. Left main, left anterior descending and right coronary atherosclerosis. Mediastinum/Nodes: Hypodense 1.2 cm left thyroid lobe nodule. Unremarkable esophagus. No pathologically enlarged axillary, mediastinal or hilar lymph nodes. Lungs/Pleura: No pneumothorax. No pleural effusion. Subpleural 3 mm solid peripheral left upper lobe pulmonary nodule (series 8/ image 39). Hypoventilatory changes in the dependent lower lobes. No acute consolidative airspace disease, lung masses or additional significant pulmonary nodules. Upper abdomen: Simple 1.5 cm left liver lobe cyst. Musculoskeletal: No aggressive appearing focal osseous lesions. Mild to moderate thoracic spondylosis. Review of the MIP images confirms the above findings. IMPRESSION: 1. Acute lobar, segmental and subsegmental pulmonary embolism isolated to the right lower lobe. No central pulmonary emboli. Normal main pulmonary artery diameter. RV / LV ratio is not elevated. 2. Aortic atherosclerosis. Ectatic ascending thoracic aorta, maximum diameter 4.2 cm. 3. Aberrant nonaneurysmal right subclavian artery arising from the distal aortic arch with retroesophageal course. 4. Solitary subpleural 3 mm left upper lobe pulmonary nodule, probably benign. No follow-up needed if patient is low-risk. Non-contrast chest CT can be considered in 12 months if patient is high-risk. This recommendation follows the consensus statement: Guidelines for Management of Incidental Pulmonary Nodules Detected on CT Images: From the Fleischner Society 2017; Radiology 2017;  [id number]. These results were called by telephone at the time of interpretation on 12/Paula/2017 at 5:08 pm to Dr. Inocencio Homes, who verbally acknowledged these results. Electronically Signed   By: Ilona Sorrel M.D.   On: 12/Paula/2017 17:Paula   US Venous Img Lower Unilateral Left  Result Date: 12/Paula/2017 CLINICAL DATA:  Left lower extremity swelling for 2 weeks. Recent left hip fracture status postoperative fixation 11/28/2016 EXAM: LEFT LOWER EXTREMITY VENOUS DOPPLER ULTRASOUND TECHNIQUE: Gray-scale sonography with graded compression, as well as color Doppler and duplex ultrasound were performed to evaluate the lower extremity deep venous systems from the level of the common femoral vein and including the common femoral, femoral, profunda femoral, popliteal and calf veins including the posterior tibial, peroneal and gastrocnemius veins when visible. The superficial great saphenous vein was also interrogated. Spectral Doppler was utilized to evaluate flow at rest and with distal augmentation maneuvers in the common femoral, femoral and popliteal veins. COMPARISON:  None. FINDINGS: Contralateral Common Femoral Vein: Respiratory phasicity is normal and symmetric with the symptomatic side. No evidence of thrombus. Normal compressibility. Common Femoral Vein: Slight wall thickening and small diameter without acute DVT. Suspect sequelae from prior DVT. Saphenofemoral Junction: No evidence of thrombus. Normal compressibility and flow on color Doppler imaging. Profunda Femoral Vein: Similar wall thickening and small diameter without acute thrombus. Suspect sequelae from prior DVT. Femoral Vein: Proximal femoral vein also demonstrates wall thickening and small diameter without thrombus. Compatible with sequelae from prior DVT. Mid and distal femoral vein appears patent and normal. Popliteal Vein: No evidence of thrombus. Normal compressibility, respiratory phasicity and response to augmentation. Calf Veins: No evidence of  thrombus. Normal compressibility and flow on color Doppler imaging. Superficial Great Saphenous Vein: No evidence of thrombus. Normal compressibility and flow on color Doppler imaging. Venous Reflux:  None. Other Findings:  None. IMPRESSION: Negative for acute left lower extremity DVT. Wall thickening and small diameter of the left common femoral, proximal profunda femoral and proximal femoral veins. Suspect sequelae from prior DVT. Electronically Signed   By: Jerilynn Mages.  Shick M.D.  On: 12/Paula/2017 10:59   Dg Hip Unilat W Or Wo Pelvis 2-3 Views Left  Result Date: 12/08/2016 CLINICAL DATA:  Dementia, lower extremity deformities, recent left hip fixation for a left femoral surgical neck fracture. EXAM: DG HIP (WITH OR WITHOUT PELVIS) 2-3V LEFT COMPARISON:  11/27/2016 FINDINGS: Bones are osteopenic. Degenerative changes noted of the lumbar sacral spine with a mild scoliosis. Bony pelvis appears intact. 3 left hip fixation screws traverse the left hip subcapital femoral neck fracture. Lateral skin staples noted. Grossly stable appearance. Remote operative fixation of the right hip as well. IMPRESSION: Stable appearance of the left hip subcapital femoral neck fracture status postoperative fixation. Limited exam because of positioning. Other chronic and postoperative findings as above. Electronically Signed   By: Jerilynn Mages.  Shick M.D.   On: 12/08/2016 12:18    Assessment/Plan:     History of pulmonary embolism-she did have a segmental PE on CT scan after hip surgery she is asymptomatic-but still is high risk of having another episode as she is fairly immobile secondary to her dementia.  This is complicated with high risk of bleeding due to her histories of fall frailty and age-however thought at this time benefits of anticoagulation outweighs the risk.   She is on Xarelto 20 mg a day-at this point will continue this will defer to primary care provider and hospice when she  Goes to new facility.  #2 hypertension this  appears to be stable she is on clonidine 0.2 mg.  #3 history of fracture neck of the left femur-she is on calcium she is followed by orthopedics--and  thought to be stable per review of orthopedic note on 12/17/2016.  She does have tramadol as needed for pain as well as Tylenol twice a day.  #4 anemia-hemoglobin has been stable since surgery she is IRON-also has had thrombocytosis but this appears to be trending down slowly. Will defer further labs since she is about to be under hospice services will defer to hospice and primary care provider when she goes to the next facility    #5 history of dementia-she is on Aricept is thought her prognosis is fairly poor here she has lost some weight t She continues to have a poor prognosis and will be under hospice services-supplements are being encouraged although apparently her intake is quite spotty she is also on Remeron will defer to primary care provider at new facility whether to continue this--.  #6 history of hypokalemia this appears fairly mild but somewhat chronic-potassium was normalized at 3.7 on lab done 01/01/2017.  #7-history diabetes type 2 this appears diet-controlled blood sugars appear to be satisfactory lhemoglobin A1c most recently was 5.9.  #8 history hypothyroidism?-- TSH was 4.25 on lab done 12/10/2016   #9 history of C. difficile she is on Flagyl will complete a course on January 27-will need to be monitored clinically at next facility whether to continue this  Patient continues with significant failure to thrive issues with numerous comorbidities as noted above she will be under hospice services at nextfacility --     Addendum-apparently patient will not --be discharged later this week as originally planned-she does have C. difficile and is on contact precautions as well as her husband who also has C. difficile and is under contact precautions-apparently this precludes her from going to the assisted living  facility---at this time they will remain in this facility-consider possibly updating labs if felt warranted-hopes are that they will be able to be discharged next week to the assisted facility--at  this point  Not sure whether will be seen by hospice here-  [id number]-of note greater than 35 minutes spent assessing patient-reviewing her labs-discussing her status with nursing staff-and coordinating and formulating a plan of care for numerous diagnoses-of note greater than 50% of time spent coordinating plan of care

## 2017-01-08 ENCOUNTER — Other Ambulatory Visit (HOSPITAL_COMMUNITY)
Admission: AD | Admit: 2017-01-08 | Discharge: 2017-01-08 | Disposition: A | Discharge status: Home / Self Care | Payer: PPO | Source: Skilled Nursing Facility | Attending: Internal Medicine | Admitting: Internal Medicine

## 2017-01-08 ENCOUNTER — Non-Acute Institutional Stay (SKILLED_NURSING_FACILITY): Payer: PPO | Admitting: Internal Medicine

## 2017-01-08 DIAGNOSIS — F0280 Dementia in other diseases classified elsewhere without behavioral disturbance: Secondary | ICD-10-CM | POA: Diagnosis not present

## 2017-01-08 DIAGNOSIS — E870 Hyperosmolality and hypernatremia: Secondary | ICD-10-CM

## 2017-01-08 DIAGNOSIS — G309 Alzheimer's disease, unspecified: Secondary | ICD-10-CM | POA: Diagnosis not present

## 2017-01-08 DIAGNOSIS — I10 Essential (primary) hypertension: Secondary | ICD-10-CM | POA: Insufficient documentation

## 2017-01-08 DIAGNOSIS — R627 Adult failure to thrive: Secondary | ICD-10-CM | POA: Diagnosis not present

## 2017-01-08 LAB — CBC WITH DIFFERENTIAL/PLATELET
BASOS ABS: 0 10*3/uL (ref 0.0–0.1)
Basophils Relative: 0 %
Eosinophils Absolute: 0 10*3/uL (ref 0.0–0.7)
Eosinophils Relative: 0 %
HEMATOCRIT: 40 % (ref 36.0–46.0)
Hemoglobin: 12.2 g/dL (ref 12.0–15.0)
LYMPHS ABS: 1.2 10*3/uL (ref 0.7–4.0)
LYMPHS PCT: 9 %
MCH: 25.8 pg — ABNORMAL LOW (ref 26.0–34.0)
MCHC: 30.5 g/dL (ref 30.0–36.0)
MCV: 84.6 fL (ref 78.0–100.0)
Monocytes Absolute: 1.1 10*3/uL — ABNORMAL HIGH (ref 0.1–1.0)
Monocytes Relative: 8 %
NEUTROS ABS: 11.4 10*3/uL — AB (ref 1.7–7.7)
Neutrophils Relative %: 83 %
Platelets: 399 10*3/uL (ref 150–400)
RBC: 4.73 MIL/uL (ref 3.87–5.11)
RDW: 17.8 % — ABNORMAL HIGH (ref 11.5–15.5)
WBC: 13.7 10*3/uL — AB (ref 4.0–10.5)

## 2017-01-08 LAB — BASIC METABOLIC PANEL
ANION GAP: 12 (ref 5–15)
BUN: 70 mg/dL — ABNORMAL HIGH (ref 6–20)
CHLORIDE: 112 mmol/L — AB (ref 101–111)
CO2: 27 mmol/L (ref 22–32)
Calcium: 9.6 mg/dL (ref 8.9–10.3)
Creatinine, Ser: 1.05 mg/dL — ABNORMAL HIGH (ref 0.44–1.00)
GFR calc Af Amer: 52 mL/min — ABNORMAL LOW (ref >60)
GFR, EST NON AFRICAN AMERICAN: 45 mL/min — AB (ref >60)
GLUCOSE: 138 mg/dL — AB (ref 65–99)
POTASSIUM: 3.6 mmol/L (ref 3.5–5.1)
SODIUM: 151 mmol/L — AB (ref 135–145)

## 2017-01-09 ENCOUNTER — Encounter: Payer: Self-pay | Admitting: Internal Medicine

## 2017-01-09 ENCOUNTER — Non-Acute Institutional Stay (SKILLED_NURSING_FACILITY): Payer: PPO | Admitting: Internal Medicine

## 2017-01-09 DIAGNOSIS — R627 Adult failure to thrive: Secondary | ICD-10-CM

## 2017-01-09 NOTE — Progress Notes (Signed)
Location:   [facility] Room Number: 124/P Place of Service:  SNF [phone]) Provider:  Fernand Parkins, MD  Patient Care Team: Paula Squibb, MD as PCP - General (Internal Medicine)  Extended Emergency Contact Information Primary Emergency Contact: Paula, [name], [place] [address] [address] Phone: [phone] Relation: Grandson Secondary Emergency Contact: [name],Paula Address: [address]          [place], [place] [address] [place] Phone: [phone] Relation: Grandaughter  Code Status:  DNR Goals of care: Advanced Directive information Advanced Directives 01/09/2017  Does Patient Have a Medical Advance Directive? Yes  Type of Advance Directive Out of facility DNR (pink MOST or yellow form)  Does patient want to make changes to medical advance directive? No - Patient declined  Copy of [place] in Chart? -     Chief Complaint  Patient presents with  . Acute Visit    Failure to thrive    HPI:  Pt is a 83 y.o. female seen today for an acute visit for Follow-up failure to thrive She recently was scheduled for discharge yesterday however there were complications since  she and husband are still under contact precautions for C. difficile and apparently discharge was deferred until next week.  Patient continues to have significant failure to thrive issues with numerous comorbidities including progressing dementia-recent left hip fracture with repair-as well as history of pulmonary embolism and recent C. difficile.  There were plans to come under hospice the supervision when they went to the new facility but apparently this again as noted above has been delayed  Lab today does show elevated sodium of 1 51 units 170 creatinine 1.05  Her white count is also somewhat elevated at 13.7  Her vital signs are stable but she is not eating and drinking much appears to be increasingly  frail-her husband also is in the facility in under similar circumstances with failure to thrive and dementia again he was going to go to the facility as well on discharge.         Past Medical History:  Diagnosis Date  . Dementia   . Diabetes mellitus   . Diagnosis unknown   . High cholesterol   . Hypertension    Past Surgical History:  Procedure Laterality Date  . EYE SURGERY    . HIP PINNING,CANNULATED Right 03/20/2016   Procedure: INSERT SCREWS RIGHT HIP-CANNULATED PINNING RIGHT HIP;  Surgeon: Paula Civil, MD;  Location: [facility];  Service: Orthopedics;  Laterality: Right;  . HIP PINNING,CANNULATED Left 11/27/2016   Procedure: LEFT HIP PINNING;  Surgeon: Paula Civil, MD;  Location: [facility];  Service: Orthopedics;  Laterality: Left;    No Known Allergies  Allergies as of 01/08/2017   No Known Allergies     Medication List       Accurate as of 01/08/17 11:59 PM. Always use your most recent med list.          acetaminophen 500 MG chewable tablet Commonly known as:  TYLENOL Chew 500 mg by mouth 2 (two) times daily.   calcium-vitamin D 500-200 MG-UNIT tablet Commonly known as:  OSCAL WITH D Take 1 tablet by mouth 2 (two) times daily.   cloNIDine 0.2 mg/24hr patch Commonly known as:  CATAPRES - Dosed in mg/24 hr Place 1 patch (0.2 mg total) onto the skin once a week.   donepezil  10 MG tablet Commonly known as:  ARICEPT Take 10 mg by mouth at bedtime.   feeding supplement (PRO-STAT 64) Liqd Take 30 mLs by mouth 2 (two) times daily between meals.   ferrous sulfate 325 (65 FE) MG tablet Take 325 mg by mouth daily with breakfast.   GLUCERNA Liqd Take 237 mLs by mouth 2 (two) times daily between meals.   LORazepam 0.5 MG tablet Commonly known as:  ATIVAN Take 0.5 tablets (0.25 mg total) by mouth every 8 (eight) hours as needed for anxiety.   metroNIDAZOLE 500 MG tablet Commonly known as:  FLAGYL Take 500 mg by mouth 3 (three) times daily. Give for  10 days stop date 01/10/17   mirtazapine 15 MG tablet Commonly known as:  REMERON Give 1/2 of 15 mg tablet by mouth at bedtime   polyethylene glycol packet Commonly known as:  MIRALAX / GLYCOLAX Take 17 g by mouth daily.   potassium chloride 10 MEQ tablet Commonly known as:  K-DUR,KLOR-CON Take 30 mEq by mouth every other day.   RISA-BID PROBIOTIC Tabs Give 1 tablet by mouth twice a day   rivaroxaban 20 MG Tabs tablet Commonly known as:  XARELTO Take 20 mg by mouth daily with supper.   traMADol 50 MG tablet Commonly known as:  ULTRAM Take 1 tablet (50 mg total) by mouth every 6 (six) hours.       Review of Systems   Essentially unattainable secondary to dementia please see history of present illness  Immunization History  Administered Date(s) Administered  . Tdap 07/20/2011   Pertinent  Health Maintenance Due  Topic Date Due  . FOOT EXAM  02/08/1933  . OPHTHALMOLOGY EXAM  02[birth year]08/1933  . URINE MICROALBUMIN  02[birth year]08/1935  . DEXA SCAN  02/08/1990  . INFLUENZA VACCINE  11/14/2017 (Originally 07/15/2016)  . PNA vac Low Risk Adult (1 of 2 - PCV13) 11/14/2017 (Originally 02/08/1990)  . HEMOGLOBIN A1C  06/10/2017   No flowsheet data found. Functional Status Survey:    Vitals:   01/09/17 1417  BP: 128/80  Pulse: 69  Resp: 20  Temp: 97.7 F (36.5 C)  TempSrc: Oral  SpO2: 95%  Weight: 80 lb 12.8 oz (36.7 kg)  Height: 5' (1.524 m)    Physical Exam Constitutional:Frail elderly female in comfortably in bed. She appears cachectic.  She continues to be agitated with any attempt at  examination   HENT:  Head: Normocephalic.  Mucous membranes appear dry Mouth/Throat: Did not open her mouth very wide but oropharynx appeared to be clear.  Eyes: Pupils are equal, round, and reactive to light.  Cardiovascular: Normal rate, regular rhythmand normal heart sounds.  Pulmonary/Chest: Effort normaland breath sounds normal. No respiratory distress. She has no wheezes. She  has no rales. --Somewhat shallow air entry with poor respiratory effort Abdominal: Soft. Bowel sounds are normal. She exhibits no distension. Does not appear to have any acute tenderness although difficult to fully tell since she is quite agitated with exam  Musculoskeletal:   holds lower extremities in somewhat of a contracted position she has protective boots on does move her upper extremities strength appears to be intact her upper extremities   Neurological: She is alert. Appears able to move all extremities although this is wAS limited secondary patient being agitated with exam Skin: Skin is warmand dry. No rashnoted. No erythema.  Psychiatric:  Not evaluated due to dementia. She is agitated with the exam which has been her normal behavior   Labs reviewed:  Recent  Labs  12/29/16 0730 01/01/17 0700 01/08/17 1600  NA 137 141 151*  K 3.4* 3.7 3.6  CL 102 104 112*  CO2 28 27 27   GLUCOSE 98 111* 138*  BUN 23* 28* 70*  CREATININE 0.63 0.76 1.05*  CALCIUM 8.8* 9.0 9.6    Recent Labs  03/20/16 0030 01/01/17 0700  AST 45* 28  ALT 22 9*  ALKPHOS 76 68  BILITOT 1.0 0.6  PROT 6.5 6.4*  ALBUMIN 3.7 3.1*    Recent Labs  12/16/16 0800 12/22/16 0700 12/25/16 0500 12/29/16 0730 01/08/17 1600  WBC 10.3 7.8 10.4 8.1 13.7*  NEUTROABS 8.0* 5.4  --   --  11.4*  HGB 10.0* 9.6* 9.9* 10.0* 12.2  HCT 31.9* 30.4* 31.3* 31.9* 40.0  MCV 82.2 82.8 81.9 82.9 84.6  PLT 694* 619* 565* 559* 399   Lab Results  Component Value Date   TSH 4.250 12/10/2016   Lab Results  Component Value Date   HGBA1C 5.9 (H) 12/10/2016   No results found for: CHOL, HDL, LDLCALC, LDLDIRECT, TRIG, CHOLHDL  Significant Diagnostic Results in last 30 days:  Dg Chest 1 View  Result Date: 12/14/2016 CLINICAL DATA:  Leukocytosis. EXAM: CHEST 1 VIEW COMPARISON:  November 26, 2016 FINDINGS: The heart size and mediastinal contours are stable. There is no focal infiltrate, pulmonary edema, or pleural  effusion. The visualized skeletal structures are unremarkable. IMPRESSION: No active cardiopulmonary disease. Electronically Signed   By: Abelardo Diesel M.D.   On: 12/14/2016 16:49    Assessment/Plan #1-failure to thrive-I did have a fairly extensive discussion again with her granddaughter her responsible party via phone-we did discuss end-of-life issues and aggressiveness of care-her granddaughter with wishes at this point to just have her Comfortable-she still would like her grandmother when possible to go to the assisted living facility she was once -and will be under hospice care there-we will attempt to obtain hospice care here as well prior to discharge--per discussion with granddaughter will order comfort care here with no IVs no hospitalizations no further labs emphasis on quality instead of quantity of life which was discussed with her granddaughter as well.  Her pain level will have to be monitored at this point this appears to be stable she does not appear to be uncomfortable.  She does continue on Flagyl for a history of C. difficile white count is somewhat elevated but she does not appear to be overtly septic and again emphasis is on comfort care and I feel Flagyl probably is adding to her comfort with the C. difficile.  At this point will continue to monitor for any changes.\   [id number] note greater than 25 minutes spent assessing patient-reviewing her labs-and discussing her status with her responsible party her granddaughter via phone--- of note greater than 50% of time spent coordinating and formulating plan of care     Paula [name], [place]

## 2017-01-11 NOTE — Progress Notes (Signed)
This is an acute visit.  Care skilled.  Facility is [facility].  Chief complaint acute visit follow-up failure to thrive with end-stage dementia.  History of present illness.  Patient is a 83 year old female who has been seen this past week Paula [name] for discharge which was delayed as well as comfort care measures.  She was here originally for rehabilitation after a left hip fracture that was surgically repaired this was complicated with a small pulmonary embolism as well as progressing dementia and also C. difficile.  She is completing a course of Flagyl for C. difficile she continues on Xarelto for anticoagulation with history of pulmonary embolism.  She is not eating and drinking very well-I have discussed her status with her responsible party her granddaughter she is now essentially comfort care measures with emphasis on comfort with no IVs no hospitalizations no labs no aggressive measures.  Today she appears to be resting comfortably although is declining her sodium on recent lab was elevated at 151-she does not appear to be in any distress-nursing does not report any issues other than again not eating and drinking well at all.  Past Medical History:  Diagnosis Date  . Dementia   . Diabetes mellitus   . Diagnosis unknown   . High cholesterol   . Hypertension         Past Surgical History:  Procedure Laterality Date  . EYE SURGERY    . HIP PINNING,CANNULATED Right 03/20/2016   Procedure: INSERT SCREWS RIGHT HIP-CANNULATED PINNING RIGHT HIP;  Surgeon: Carole Civil, MD;  Location: [facility];  Service: Orthopedics;  Laterality: Right;  . HIP PINNING,CANNULATED Left 11/27/2016   Procedure: LEFT HIP PINNING;  Surgeon: Carole Civil, MD;  Location: [facility];  Service: Orthopedics;  Laterality: Left;    No Known Allergies  Allergies as of 01/08/2017   No Known Allergies              Medication List           Accurate as of 01/08/17 11:59 PM.  Always use your most recent med list.           acetaminophen 500 MG chewable tablet Commonly known as:  TYLENOL Chew 500 mg by mouth 2 (two) times daily.   calcium-vitamin D 500-200 MG-UNIT tablet Commonly known as:  OSCAL WITH D Take 1 tablet by mouth 2 (two) times daily.   cloNIDine 0.2 mg/24hr patch Commonly known as:  CATAPRES - Dosed in mg/24 hr Place 1 patch (0.2 mg total) onto the skin once a week.   donepezil 10 MG tablet Commonly known as:  ARICEPT Take 10 mg by mouth at bedtime.   feeding supplement (PRO-STAT 64) Liqd Take 30 mLs by mouth 2 (two) times daily between meals.   ferrous sulfate 325 (65 FE) MG tablet Take 325 mg by mouth daily with breakfast.   GLUCERNA Liqd Take 237 mLs by mouth 2 (two) times daily between meals.   LORazepam 0.5 MG tablet Commonly known as:  ATIVAN Take 0.5 tablets (0.25 mg total) by mouth every 8 (eight) hours as needed for anxiety.   metroNIDAZOLE 500 MG tablet Commonly known as:  FLAGYL Take 500 mg by mouth 3 (three) times daily. Give for 10 days stop date 01/10/17   mirtazapine 15 MG tablet Commonly known as:  REMERON Give 1/2 of 15 mg tablet by mouth at bedtime   polyethylene glycol packet Commonly known as:  MIRALAX / GLYCOLAX Take 17 g by mouth daily.  potassium chloride 10 MEQ tablet Commonly known as:  K-DUR,KLOR-CON Take 30 mEq by mouth every other day.   RISA-BID PROBIOTIC Tabs Give 1 tablet by mouth twice a day   rivaroxaban 20 MG Tabs tablet Commonly known as:  XARELTO Take 20 mg by mouth daily with supper.   traMADol 50 MG tablet Commonly known as:  ULTRAM Take 1 tablet (50 mg total) by mouth every 6 (six) hours.       Review of Systems   Essentially unattainable secondary to dementia please see history of present illness      Immunization History  Administered Date(s) Administered  . Tdap 07/20/2011       Pertinent  Health Maintenance Due  Topic Date Due  . FOOT  EXAM  [birth year]08/1933  . OPHTHALMOLOGY EXAM  [birth year]08/1933  . URINE MICROALBUMIN  02[birth year]08/1935  . DEXA SCAN  02/08/1990  . INFLUENZA VACCINE  11/14/2017 (Originally 07/15/2016)  . PNA vac Low Risk Adult (1 of 2 - PCV13) 11/14/2017 (Originally 02/08/1990)  . HEMOGLOBIN A1C  06/10/2017   No flowsheet data found. Functional Status Survey:    Physical Exam Constitutional:Frail elderly female in comfortably in bed. She appears cachectic.  She continues to be agitated with any attempt at examination But less agitated-- weaker then earlier this week   HENT:  Head: Normocephalic.  Mucous membranes appear dry Mouth/Throat: Did not open her mouth very wide but oropharynx appeared to be clear.  Eyes: Pupils are equal, round, and reactive to light.  Cardiovascular: Normal rate, regular rhythmand normal heart sounds.  Pulmonary/Chest: Effort normaland breath sounds normal. No respiratory distress. She has no wheezes. She has no rales. --Somewhat shallow air entry with poor respiratory effort Abdominal: Soft. Bowel sounds are normal. She exhibits no distension. Does not appear to have any acute tenderness although difficult to fully tell since she is quite agitated with exam  Musculoskeletal:  holdslower extremities insomewhat of a contracted position she has protective boots on does move her upper extremities strength appears to be intact her upper extremities   Neurological: She is alert. --Somewhat agitated but less so than on previous exams Skin: Skin is warmand dry. No rashnoted. No erythema. r   Labs reviewed:  Recent Labs (within last 365 days)   Recent Labs  12/29/16 0730 01/01/17 0700 01/08/17 1600  NA 137 141 151*  K 3.4* 3.7 3.6  CL 102 104 112*  CO2 28 27 27   GLUCOSE 98 111* 138*  BUN 23* 28* 70*  CREATININE 0.63 0.76 1.05*  CALCIUM 8.8* 9.0 9.6      Recent Labs (within last 365 days)   Recent Labs  03/20/16 0030 01/01/17 0700  AST 45* 28  ALT 22  9*  ALKPHOS 76 68  BILITOT 1.0 0.6  PROT 6.5 6.4*  ALBUMIN 3.7 3.1*      Recent Labs (within last 365 days)   Recent Labs  12/16/16 0800 12/22/16 0700 12/25/16 0500 12/29/16 0730 01/08/17 1600  WBC 10.3 7.8 10.4 8.1 13.7*  NEUTROABS 8.0* 5.4  --   --  11.4*  HGB 10.0* 9.6* 9.9* 10.0* 12.2  HCT 31.9* 30.4* 31.3* 31.9* 40.0  MCV 82.2 82.8 81.9 82.9 84.6  PLT 694* 619* 565* 559* 399     Recent Labs       Lab Results  Component Value Date   TSH 4.250 12/10/2016     Recent Labs       Lab Results  Component Value Date   HGBA1C 5.9 (  H) 12/10/2016     Recent Labs  No results found for: CHOL, HDL, LDLCALC, LDLDIRECT, TRIG, CHOLHDL    Significant Diagnostic Results in last 30 days:   Imaging Results  Dg Chest 1 View  Result Date: 12/14/2016 CLINICAL DATA:  Leukocytosis. EXAM: CHEST 1 VIEW COMPARISON:  November 26, 2016 FINDINGS: The heart size and mediastinal contours are stable. There is no focal infiltrate, pulmonary edema, or pleural effusion. The visualized skeletal structures are unremarkable. IMPRESSION: No active cardiopulmonary disease. Electronically Signed   By: Abelardo Diesel M.D.   On: 12/14/2016 16:49    Assessment and plan.  Failure to thrive-she appears to be increasingly weak but not in any distress-again emphasis is on comfort as noted above at this point will monitor for comfort-she continues on Flagyl for C. difficile --.  At this point she appears comfortable although increasingly weak.  [id number]

## 2017-01-15 ENCOUNTER — Encounter (HOSPITAL_COMMUNITY)
Admission: RE | Admit: 2017-01-15 | Discharge: 2017-01-15 | Disposition: A | Discharge status: Home / Self Care | Payer: PPO | Source: Skilled Nursing Facility | Attending: Internal Medicine | Admitting: Internal Medicine

## 2017-01-15 DEATH — deceased

## 2017-02-02 ENCOUNTER — Ambulatory Visit: Payer: PPO | Admitting: Orthopedic Surgery

## 2017-03-14 IMAGING — DX DG CHEST 1V
1 series · 1 of 1 positions shown · non-contrast
Comparison: None.

CLINICAL DATA: Fell tonight.

EXAM:
CHEST 1 VIEW

[chest ap]
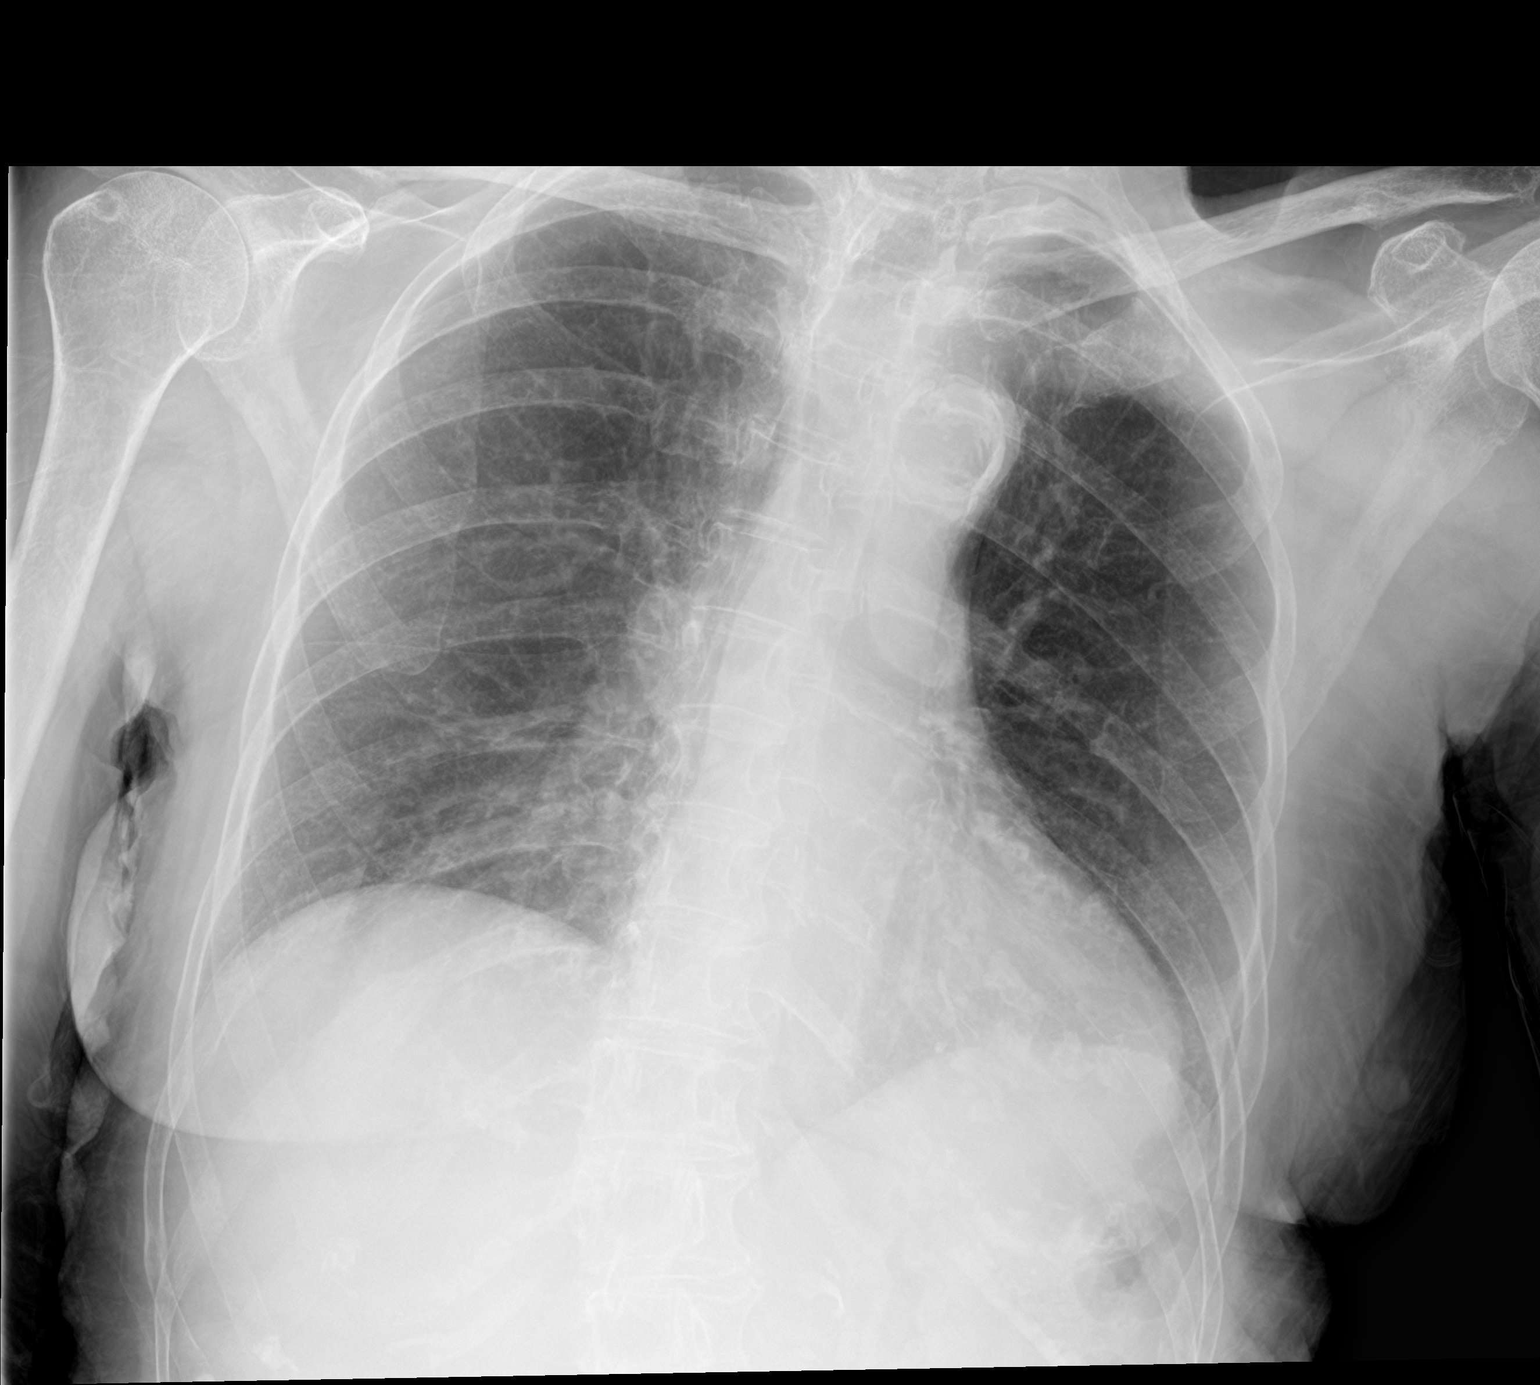

[1 of 1 positions shown; findings below may reference images not displayed]

FINDINGS: A single AP view of the chest demonstrates no focal airspace
consolidation or alveolar edema. The lungs are grossly clear. There
is no large effusion or pneumothorax. Cardiac and mediastinal
contours appear unremarkable.
IMPRESSION: No active disease.

## 2017-04-05 IMAGING — DX DG HIP (WITH OR WITHOUT PELVIS) 2-3V*R*
3 series · 3 of 3 positions shown · non-contrast
Comparison: 03/20/2016.

CLINICAL DATA: Prior ORIF right hip.  Follow-up exam .

EXAM:
DG HIP (WITH OR WITHOUT PELVIS) 2-3V RIGHT

[pelvis ap]
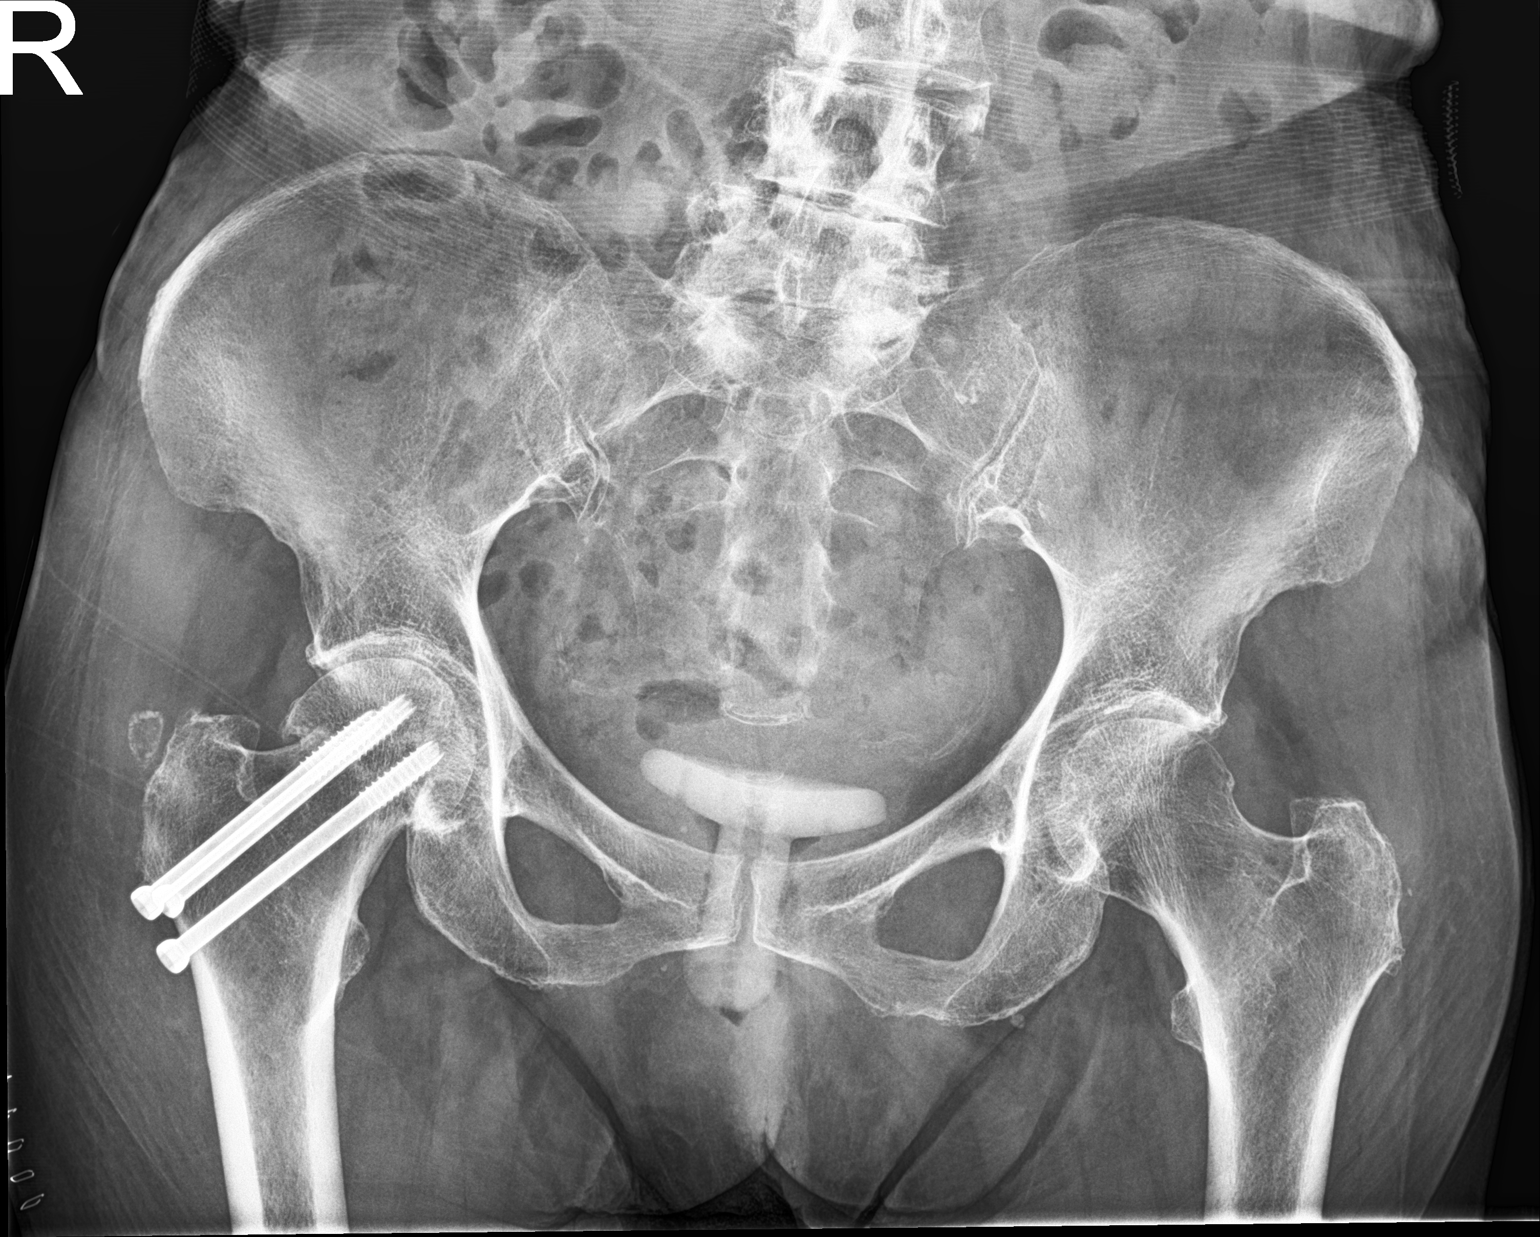

[hip ap]
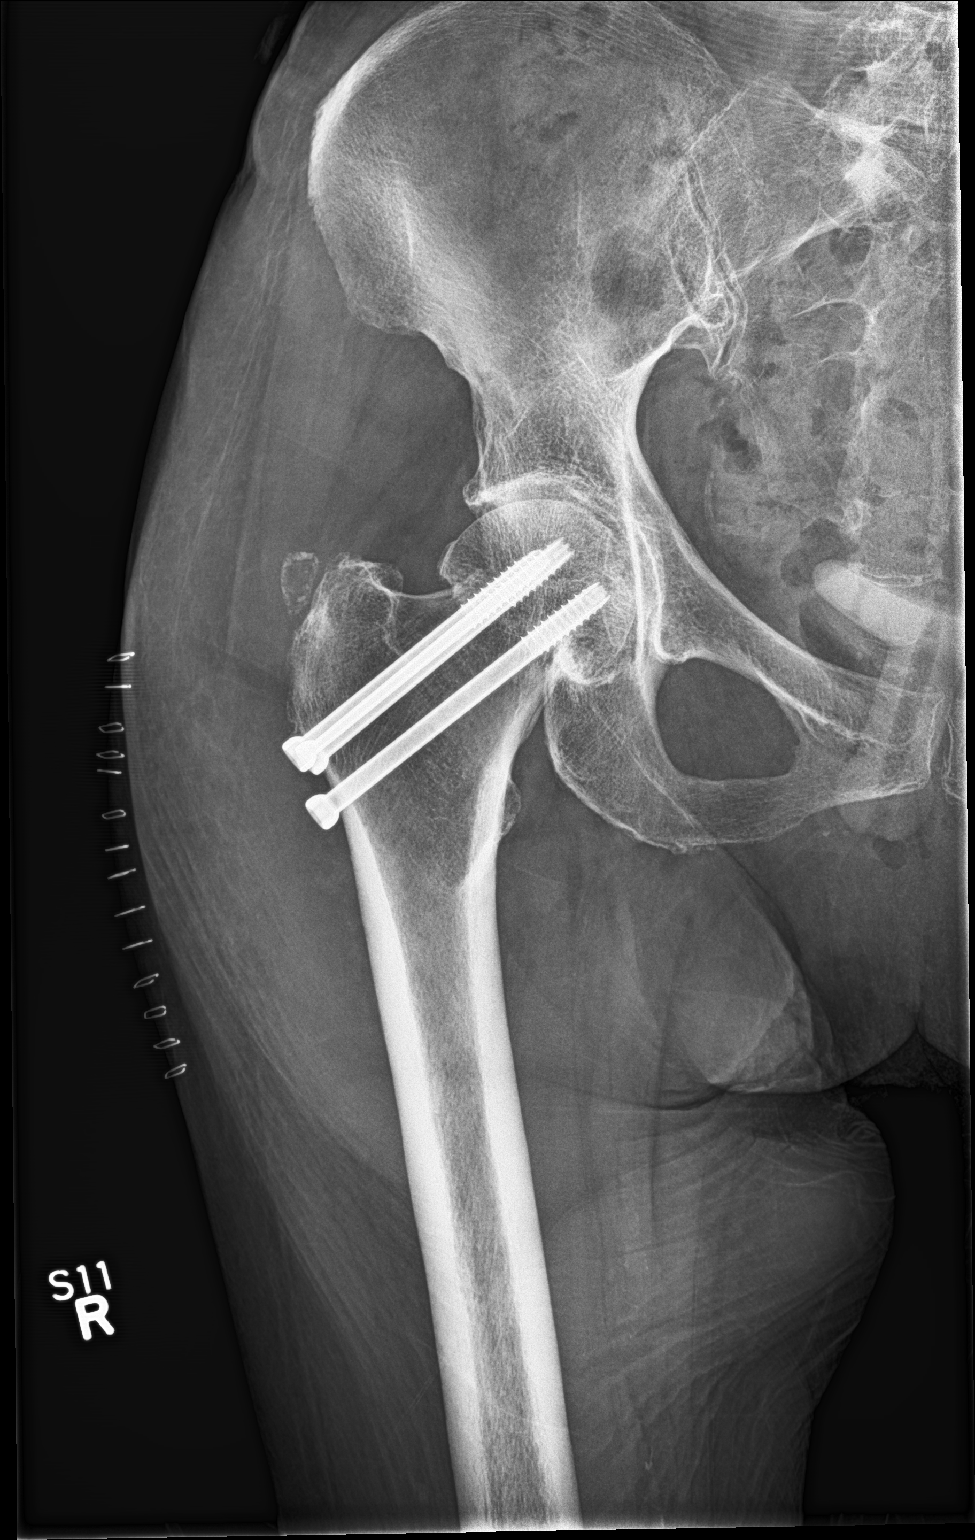

[hip lat]
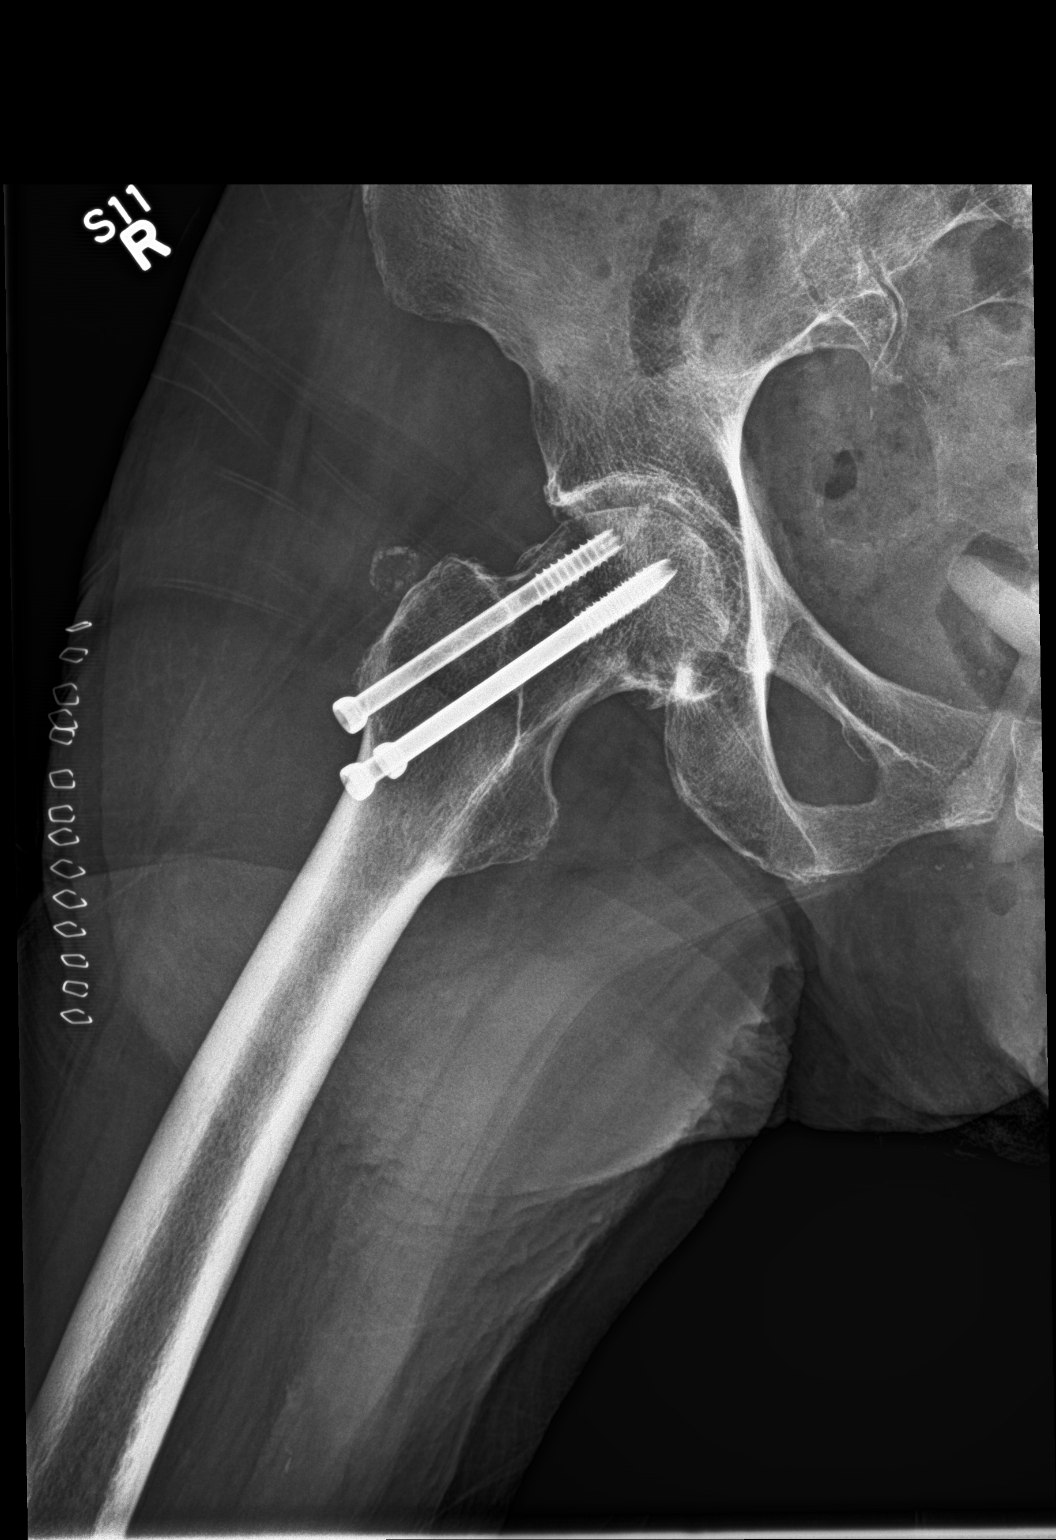

[3 of 3 positions shown; findings below may reference images not displayed]

FINDINGS: ORIF right hip with good anatomic alignment. Hardware intact.
Degenerative changes lumbar spine and both hips. Pessary noted.
IMPRESSION: ORIF right hip with good anatomic alignment.  Hardware intact.

## 2017-11-22 IMAGING — RF DG HIP (WITH PELVIS) OPERATIVE*L*
1 series · 6 of 6 positions shown · non-contrast
Comparison: Radiographs November 26, 2016.

CLINICAL DATA: Left hip fracture.

EXAM:
OPERATIVE left HIP (WITH PELVIS IF PERFORMED) 6 VIEWS
TECHNIQUE: Fluoroscopic spot image(s) were submitted for interpretation
post-operatively.
FLUOROSCOPY TIME:  1 minutes 21 seconds.
Radiation exposure index:  10.92 mGy.

[Series 1: run · 6 of 6 slices shown]
[im 1/6]
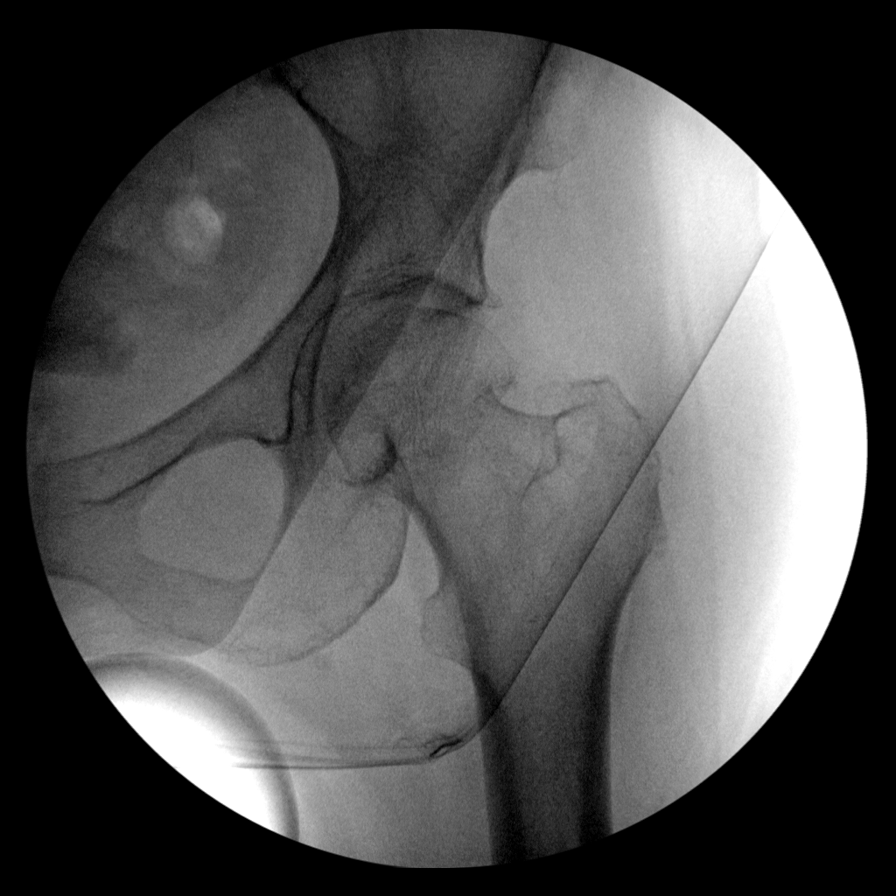
[im 2/6]
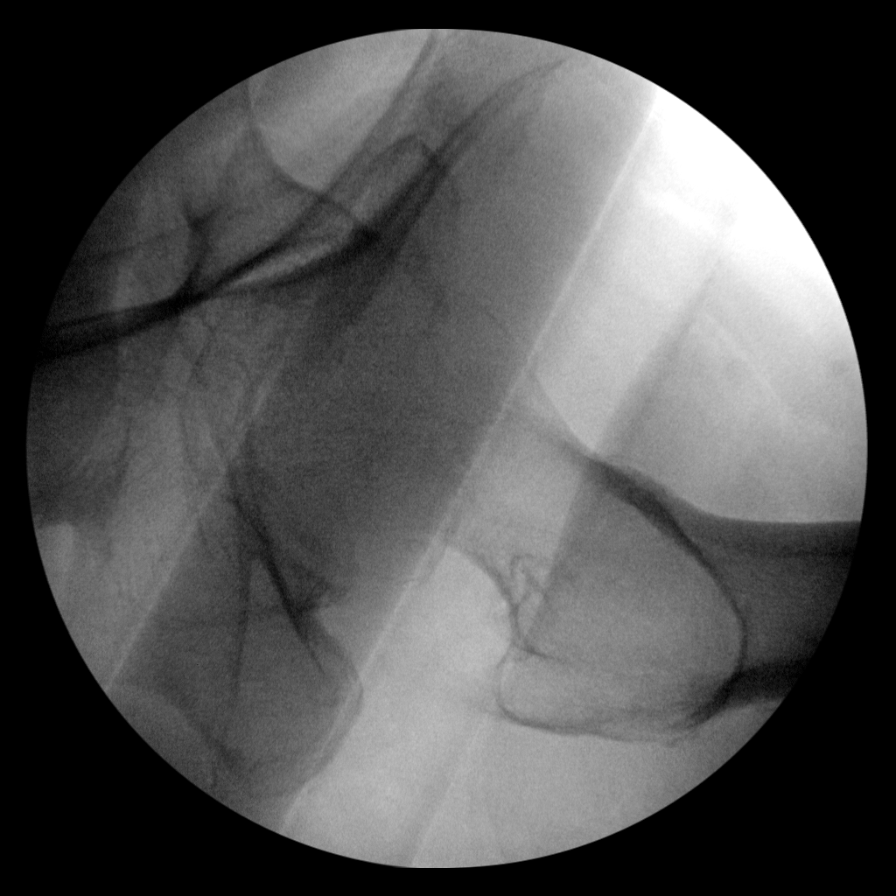
[im 3/6]
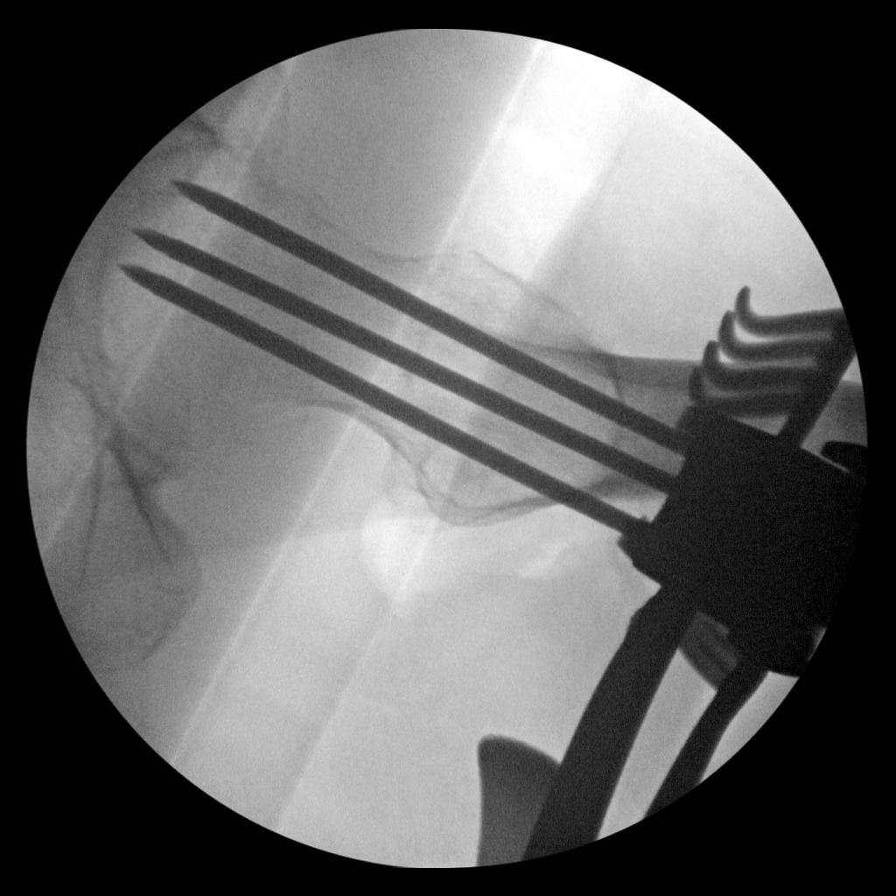
[im 4/6]
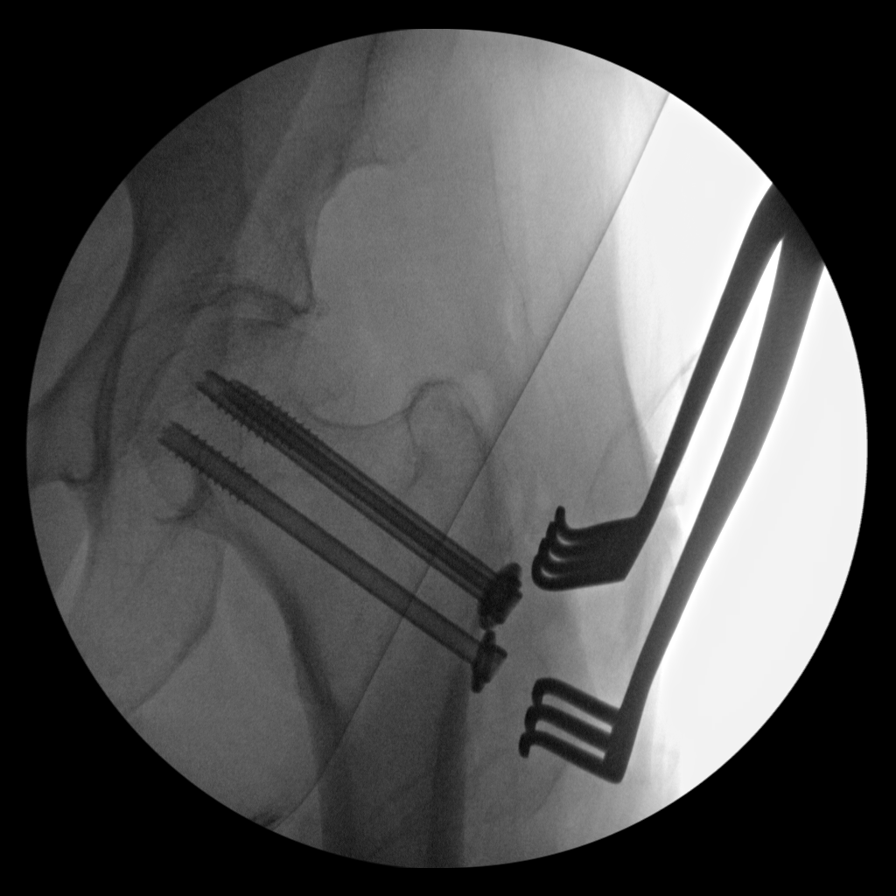
[im 5/6]
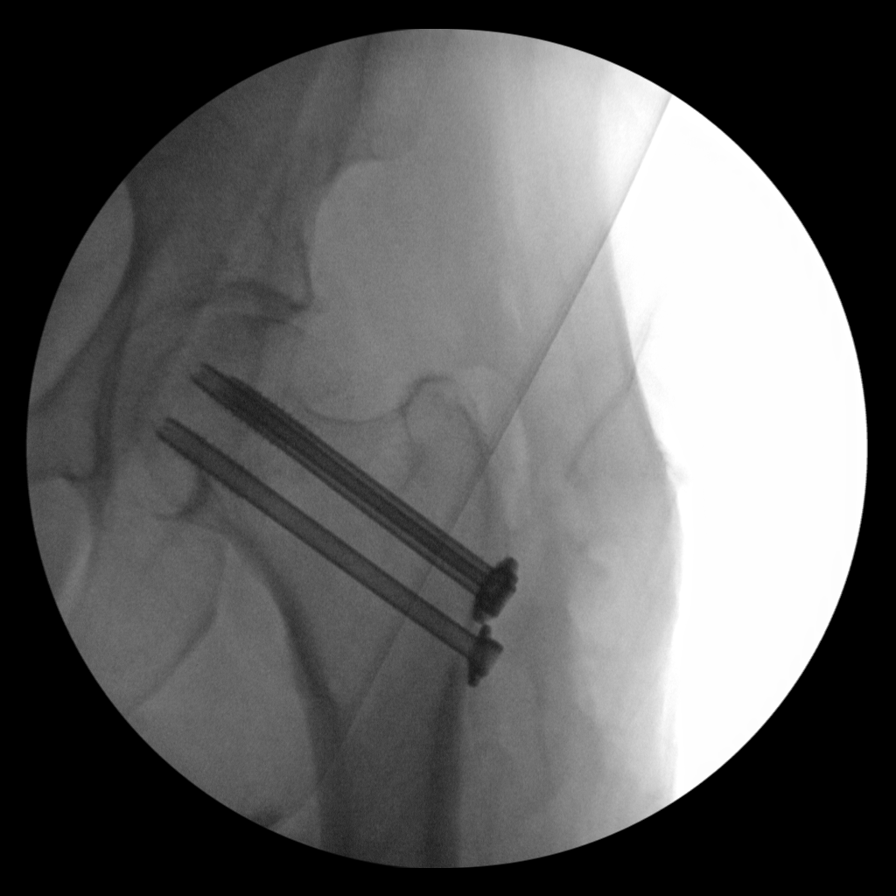
[im 6/6]
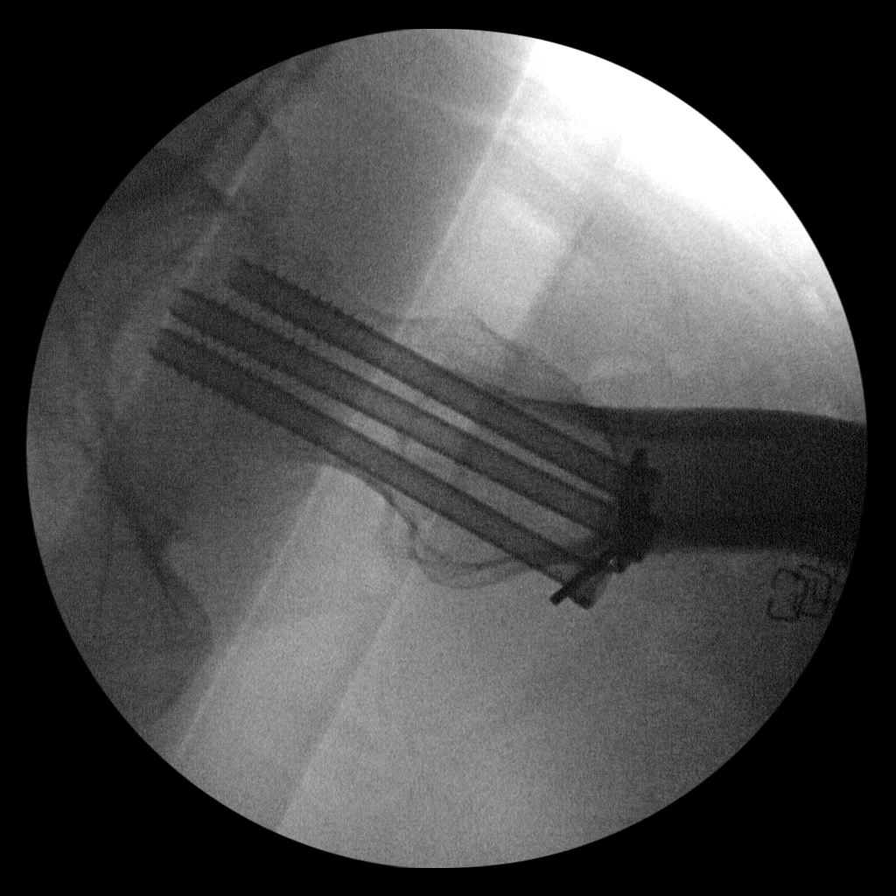

[6 of 6 positions shown; findings below may reference images not displayed]

FINDINGS: Six intraoperative fluoroscopic images of the left hip demonstrate
surgical fixation of left femoral neck fracture. Good alignment of
fracture components is noted.
IMPRESSION: Status post surgical internal fixation of left femoral neck
fracture.

## 2022-09-23 ENCOUNTER — Other Ambulatory Visit (HOSPITAL_COMMUNITY): Payer: Self-pay
